# Patient Record
Sex: Male | Born: 1980 | Hispanic: Yes | Marital: Married | Smoking: Current every day smoker
Health system: Southern US, Community
[De-identification: ages and names within clinical notes are randomized; demographics above are authoritative.]

## PROBLEM LIST (undated history)

## (undated) DIAGNOSIS — J309 Allergic rhinitis, unspecified: Secondary | ICD-10-CM

## (undated) DIAGNOSIS — E119 Type 2 diabetes mellitus without complications: Secondary | ICD-10-CM

## (undated) DIAGNOSIS — Z9981 Dependence on supplemental oxygen: Secondary | ICD-10-CM

## (undated) DIAGNOSIS — M545 Low back pain, unspecified: Secondary | ICD-10-CM

## (undated) DIAGNOSIS — K219 Gastro-esophageal reflux disease without esophagitis: Secondary | ICD-10-CM

## (undated) DIAGNOSIS — G4733 Obstructive sleep apnea (adult) (pediatric): Secondary | ICD-10-CM

## (undated) DIAGNOSIS — J449 Chronic obstructive pulmonary disease, unspecified: Secondary | ICD-10-CM

## (undated) DIAGNOSIS — L309 Dermatitis, unspecified: Secondary | ICD-10-CM

## (undated) DIAGNOSIS — F1291 Cannabis use, unspecified, in remission: Secondary | ICD-10-CM

## (undated) DIAGNOSIS — I890 Lymphedema, not elsewhere classified: Secondary | ICD-10-CM

## (undated) DIAGNOSIS — I700 Atherosclerosis of aorta: Secondary | ICD-10-CM

## (undated) DIAGNOSIS — I10 Essential (primary) hypertension: Secondary | ICD-10-CM

## (undated) DIAGNOSIS — J45909 Unspecified asthma, uncomplicated: Secondary | ICD-10-CM

## (undated) DIAGNOSIS — G629 Polyneuropathy, unspecified: Secondary | ICD-10-CM

---

## 2012-12-04 DIAGNOSIS — J4550 Severe persistent asthma, uncomplicated: Secondary | ICD-10-CM | POA: Diagnosis present

## 2013-08-20 DIAGNOSIS — Z720 Tobacco use: Secondary | ICD-10-CM | POA: Insufficient documentation

## 2014-09-24 DIAGNOSIS — R7303 Prediabetes: Secondary | ICD-10-CM | POA: Insufficient documentation

## 2016-05-27 DIAGNOSIS — J9600 Acute respiratory failure, unspecified whether with hypoxia or hypercapnia: Secondary | ICD-10-CM | POA: Diagnosis not present

## 2016-05-27 DIAGNOSIS — J449 Chronic obstructive pulmonary disease, unspecified: Secondary | ICD-10-CM | POA: Diagnosis not present

## 2016-05-29 DIAGNOSIS — R05 Cough: Secondary | ICD-10-CM | POA: Diagnosis not present

## 2016-05-29 DIAGNOSIS — R062 Wheezing: Secondary | ICD-10-CM | POA: Diagnosis not present

## 2016-05-29 DIAGNOSIS — R0789 Other chest pain: Secondary | ICD-10-CM | POA: Diagnosis not present

## 2016-05-29 DIAGNOSIS — J45901 Unspecified asthma with (acute) exacerbation: Secondary | ICD-10-CM | POA: Diagnosis not present

## 2016-05-29 DIAGNOSIS — F17210 Nicotine dependence, cigarettes, uncomplicated: Secondary | ICD-10-CM | POA: Diagnosis not present

## 2016-06-09 DIAGNOSIS — J441 Chronic obstructive pulmonary disease with (acute) exacerbation: Secondary | ICD-10-CM | POA: Diagnosis not present

## 2016-06-09 DIAGNOSIS — I209 Angina pectoris, unspecified: Secondary | ICD-10-CM | POA: Diagnosis not present

## 2016-06-09 DIAGNOSIS — J309 Allergic rhinitis, unspecified: Secondary | ICD-10-CM | POA: Diagnosis not present

## 2016-06-09 DIAGNOSIS — F1290 Cannabis use, unspecified, uncomplicated: Secondary | ICD-10-CM | POA: Diagnosis not present

## 2016-06-09 DIAGNOSIS — F17210 Nicotine dependence, cigarettes, uncomplicated: Secondary | ICD-10-CM | POA: Diagnosis not present

## 2016-06-09 DIAGNOSIS — R079 Chest pain, unspecified: Secondary | ICD-10-CM | POA: Diagnosis not present

## 2016-06-09 DIAGNOSIS — J4550 Severe persistent asthma, uncomplicated: Secondary | ICD-10-CM | POA: Diagnosis not present

## 2016-06-09 DIAGNOSIS — E663 Overweight: Secondary | ICD-10-CM | POA: Diagnosis not present

## 2016-06-09 DIAGNOSIS — Z720 Tobacco use: Secondary | ICD-10-CM | POA: Diagnosis not present

## 2016-06-09 DIAGNOSIS — Z6828 Body mass index (BMI) 28.0-28.9, adult: Secondary | ICD-10-CM | POA: Diagnosis not present

## 2016-06-09 DIAGNOSIS — J4551 Severe persistent asthma with (acute) exacerbation: Secondary | ICD-10-CM | POA: Diagnosis not present

## 2016-06-09 DIAGNOSIS — Z7952 Long term (current) use of systemic steroids: Secondary | ICD-10-CM | POA: Diagnosis not present

## 2016-06-09 DIAGNOSIS — R768 Other specified abnormal immunological findings in serum: Secondary | ICD-10-CM | POA: Diagnosis not present

## 2017-03-27 ENCOUNTER — Other Ambulatory Visit: Payer: Self-pay

## 2017-03-27 ENCOUNTER — Emergency Department: Payer: Medicare Other

## 2017-03-27 ENCOUNTER — Emergency Department
Admission: EM | Admit: 2017-03-27 | Discharge: 2017-03-27 | Disposition: A | Discharge status: Home / Self Care | Payer: Medicare Other | Attending: Emergency Medicine | Admitting: Emergency Medicine

## 2017-03-27 ENCOUNTER — Encounter: Payer: Self-pay | Admitting: Physician Assistant

## 2017-03-27 DIAGNOSIS — J4551 Severe persistent asthma with (acute) exacerbation: Secondary | ICD-10-CM | POA: Diagnosis not present

## 2017-03-27 DIAGNOSIS — Z79899 Other long term (current) drug therapy: Secondary | ICD-10-CM | POA: Insufficient documentation

## 2017-03-27 DIAGNOSIS — F17200 Nicotine dependence, unspecified, uncomplicated: Secondary | ICD-10-CM | POA: Diagnosis not present

## 2017-03-27 DIAGNOSIS — L308 Other specified dermatitis: Secondary | ICD-10-CM | POA: Insufficient documentation

## 2017-03-27 DIAGNOSIS — R062 Wheezing: Secondary | ICD-10-CM | POA: Diagnosis present

## 2017-03-27 HISTORY — DX: Unspecified asthma, uncomplicated: J45.909

## 2017-03-27 HISTORY — DX: Dermatitis, unspecified: L30.9

## 2017-03-27 MED ORDER — PREDNISONE 20 MG PO TABS: 20.0000 mg | ORAL_TABLET | Freq: Every day | ORAL | 1 refills | Status: DC

## 2017-03-27 MED ORDER — METHYLPREDNISOLONE SODIUM SUCC 125 MG IJ SOLR
125.0000 mg | Freq: Once | INTRAMUSCULAR | Status: AC
  Administered 2017-03-27: 125 mg via INTRAVENOUS
  Filled 2017-03-27: qty 2

## 2017-03-27 MED ORDER — ALBUTEROL SULFATE (2.5 MG/3ML) 0.083% IN NEBU
5.0000 mg/h | INHALATION_SOLUTION | Freq: Once | RESPIRATORY_TRACT | Status: AC
  Administered 2017-03-27: 5 mg/h via RESPIRATORY_TRACT
  Filled 2017-03-27: qty 6

## 2017-03-27 MED ORDER — IPRATROPIUM-ALBUTEROL 0.5-2.5 (3) MG/3ML IN SOLN
3.0000 mL | Freq: Once | RESPIRATORY_TRACT | Status: AC
  Administered 2017-03-27: 3 mL via RESPIRATORY_TRACT
  Filled 2017-03-27: qty 3

## 2017-03-27 MED ORDER — IPRATROPIUM-ALBUTEROL 0.5-2.5 (3) MG/3ML IN SOLN: 3.0000 mL | Freq: Four times a day (QID) | RESPIRATORY_TRACT | 1 refills | Status: DC | PRN

## 2017-03-27 MED ORDER — PREDNISONE 10 MG (48) PO TBPK: ORAL_TABLET | ORAL | 0 refills | Status: DC

## 2017-03-27 NOTE — ED Provider Notes (Signed)
[facility] Emergency Department Provider Note  ____________________________________________   First MD Initiated Contact with Patient 03/27/17 1620     (approximate)  I have reviewed the triage vital signs and the nursing notes.   HISTORY  Chief Complaint No chief complaint on file.    HPI Braidyn Scorsone is a 37 y.o. male presents to the emergency department today complaining of an asthma attack and eczema flare.  He states he usually takes prednisone 20 mg/day.  He ran out and his doctor will not give him more until he seen on April 23, 2017.  He states he has a history of COPD.  He has multiple inhalers.  He used his nebulizer machine at home without relief.  He denies fever or chills.  He does not have a productive cough.  He has no vomiting or diarrhea  Past Medical History:  Diagnosis Date  . Asthma   . Eczema     There are no active problems to display for this patient.   History reviewed. No pertinent surgical history.  Prior to Admission medications   Medication Sig Start Date End Date Taking? Authorizing Provider  albuterol (PROVENTIL HFA;VENTOLIN HFA) 108 (90 Base) MCG/ACT inhaler Inhale 2 puffs into the lungs every 6 (six) hours as needed for wheezing or shortness of breath.   Yes [provider]  fluticasone-salmeterol (ADVAIR HFA) 230-21 MCG/ACT inhaler Inhale 2 puffs into the lungs 2 (two) times daily.   Yes [provider]  montelukast (SINGULAIR) 10 MG tablet Take 10 mg by mouth at bedtime.   Yes [provider]  ipratropium-albuterol (DUONEB) 0.5-2.5 (3) MG/3ML SOLN Take 3 mLs by nebulization every 6 (six) hours as needed. 03/27/17   Sherrie Mustache Roselyn Bering, [name]  predniSONE (DELTASONE) 20 MG tablet Take 1 tablet (20 mg total) by mouth daily with breakfast. 03/27/17   Sherrie Mustache, Roselyn Bering, [name]  predniSONE (STERAPRED UNI-PAK 48 TAB) 10 MG (48) TBPK tablet Take 6 pills for 2 days then decrease by 1 pill every 2 days 03/27/17    [name], [name]    Allergies Penicillins; Banana; and Eggs or egg-derived products  No family history on file.  Social History Social History   Tobacco Use  . Smoking status: Current Every Day Smoker  . Smokeless tobacco: Never Used  Substance Use Topics  . Alcohol use: No    Frequency: Never  . Drug use: Not on file    Review of Systems  Constitutional: No fever/chills Eyes: No visual changes. ENT: No sore throat. Respiratory: Denies cough, positive for wheezing and shortness of breath Genitourinary: Negative for dysuria. Musculoskeletal: Negative for back pain. Skin: Positive for eczema flare    ____________________________________________   PHYSICAL EXAM:  VITAL SIGNS: ED Triage Vitals  Enc Vitals Group     BP 03/27/17 1617 (!) 156/104     Pulse Rate 03/27/17 1617 95     Resp 03/27/17 1617 18     Temp 03/27/17 1617 98.1 F (36.7 C)     Temp Source 03/27/17 1617 Oral     SpO2 03/27/17 1617 96 %     Weight 03/27/17 1624 160 lb (72.6 kg)     Height 03/27/17 1624 5\' 5"  (1.651 m)     Head Circumference --      Peak Flow --      Pain Score --      Pain Loc --      Pain Edu? --      Excl.  in GC? --     Constitutional: Alert and oriented. Well appearing and in no acute distress. Eyes: Conjunctivae are normal.  Head: Atraumatic. Nose: No congestion/rhinnorhea. Mouth/Throat: Mucous membranes are moist.   Cardiovascular: Normal rate, regular rhythm.  Heart sounds are normal Respiratory: Normal respiratory effort.  No retractions, lungs with diffuse wheezing throughout all lung fields GU: deferred Musculoskeletal: FROM all extremities, warm and well perfused Neurologic:  Normal speech and language.  Skin:  Skin is warm, dry and intact.  Positive for diffuse eczema on his face, upper arms, across upper back, and on his lower legs. Psychiatric: Mood and affect are normal. Speech and behavior are normal.  ____________________________________________    LABS (all labs ordered are listed, but only abnormal results are displayed)  Labs Reviewed - No data to display ____________________________________________   ____________________________________________  RADIOLOGY  Chest x-ray is negative  ____________________________________________   PROCEDURES  Procedure(s) performed: DuoNeb, albuterol 5 mg/h  Procedures    ____________________________________________   INITIAL IMPRESSION / ASSESSMENT AND PLAN / ED COURSE  Pertinent labs & imaging results that were available during my care of the patient were reviewed by me and considered in my medical decision making (see chart for details).  Patient is a 37 year old male complaining of asthma and eczema flare.  He ran out of his prednisone.  On physical exam patient has diffuse wheezing in the lung fields, he has diffuse eczema on the face arms and legs and upper trunk.  DuoNeb was given.  Patient has increased air movement with this but still has diffuse wheezing.  Albuterol 5 mg/h was ordered.  Patient still has wheezing but states he can move air better.  Solu-Medrol 125 mg IV was also given.  The patient will be discharged with a prescription for Sterapred DS 10 mg 12-day Dosepak.  I also gave him a prescription for his prednisone 20 that he is to start after finishing the Sterapred.  He states he understands how to use this medication.  He was given a prescription for DuoNeb Nebules.  If he is worsening he is to return to the emergency department.  He should continue to use all of his medications.  He is to follow-up with his doctor for an already scheduled appointment on April 23, 2017.  Patient states he understands and will comply with our instructions.  He was discharged in stable condition     As part of my medical decision making, I reviewed the following data within the electronic MEDICAL RECORD NUMBER Nursing notes reviewed and incorporated, Old chart reviewed, Radiograph  reviewed chest x-ray was negative, Notes from prior ED visits and [place] Controlled Substance Database  ____________________________________________   FINAL CLINICAL IMPRESSION(S) / ED DIAGNOSES  Final diagnoses:  Other eczema  Severe persistent asthma with acute exacerbation      NEW MEDICATIONS STARTED DURING THIS VISIT:  New Prescriptions   IPRATROPIUM-ALBUTEROL (DUONEB) 0.5-2.5 (3) MG/3ML SOLN    Take 3 mLs by nebulization every 6 (six) hours as needed.   PREDNISONE (STERAPRED UNI-PAK 48 TAB) 10 MG (48) TBPK TABLET    Take 6 pills for 2 days then decrease by 1 pill every 2 days     Note:  This document was prepared using Dragon voice recognition software and may include unintentional dictation errors.    Faythe GheeFisher, Susan W, [name] 03/27/17 1807    Minna AntisPaduchowski, Kevin, MD 03/27/17 [id number]

## 2017-03-27 NOTE — Discharge Instructions (Signed)
Follow-up with your regular doctor for your scheduled appointment on March 11.  Start the steroid pack tomorrow.  When you get down to 20 mg/day you can start to use your regular medication.  Use the DuoNeb Nebules as needed for wheezing.  Return to the emergency department if your worsening

## 2017-03-27 NOTE — ED Triage Notes (Signed)
Presents with asthma  Diff breathing and wheezing  No fever   Occasional cough   Also having some increased swelling to right side of face d/t eczema

## 2017-12-05 ENCOUNTER — Emergency Department: Payer: Medicare Other

## 2017-12-05 ENCOUNTER — Inpatient Hospital Stay
Admission: EM | Admit: 2017-12-05 | Discharge: 2017-12-07 | DRG: 871 | Discharge status: Against Medical Advice | Payer: Medicare Other | Attending: Internal Medicine | Admitting: Internal Medicine

## 2017-12-05 ENCOUNTER — Other Ambulatory Visit: Payer: Self-pay

## 2017-12-05 DIAGNOSIS — K2980 Duodenitis without bleeding: Secondary | ICD-10-CM | POA: Diagnosis present

## 2017-12-05 DIAGNOSIS — F17210 Nicotine dependence, cigarettes, uncomplicated: Secondary | ICD-10-CM | POA: Diagnosis present

## 2017-12-05 DIAGNOSIS — J4551 Severe persistent asthma with (acute) exacerbation: Secondary | ICD-10-CM

## 2017-12-05 DIAGNOSIS — J440 Chronic obstructive pulmonary disease with acute lower respiratory infection: Secondary | ICD-10-CM | POA: Diagnosis present

## 2017-12-05 DIAGNOSIS — R042 Hemoptysis: Secondary | ICD-10-CM | POA: Diagnosis present

## 2017-12-05 DIAGNOSIS — R911 Solitary pulmonary nodule: Secondary | ICD-10-CM | POA: Diagnosis present

## 2017-12-05 DIAGNOSIS — J9601 Acute respiratory failure with hypoxia: Secondary | ICD-10-CM | POA: Diagnosis present

## 2017-12-05 DIAGNOSIS — Z79899 Other long term (current) drug therapy: Secondary | ICD-10-CM | POA: Diagnosis not present

## 2017-12-05 DIAGNOSIS — Z91018 Allergy to other foods: Secondary | ICD-10-CM | POA: Diagnosis not present

## 2017-12-05 DIAGNOSIS — K2900 Acute gastritis without bleeding: Secondary | ICD-10-CM | POA: Diagnosis present

## 2017-12-05 DIAGNOSIS — Z91012 Allergy to eggs: Secondary | ICD-10-CM | POA: Diagnosis not present

## 2017-12-05 DIAGNOSIS — Z7952 Long term (current) use of systemic steroids: Secondary | ICD-10-CM | POA: Diagnosis not present

## 2017-12-05 DIAGNOSIS — J189 Pneumonia, unspecified organism: Secondary | ICD-10-CM

## 2017-12-05 DIAGNOSIS — L309 Dermatitis, unspecified: Secondary | ICD-10-CM | POA: Diagnosis present

## 2017-12-05 DIAGNOSIS — Z880 Allergy status to penicillin: Secondary | ICD-10-CM | POA: Diagnosis not present

## 2017-12-05 DIAGNOSIS — I2690 Septic pulmonary embolism without acute cor pulmonale: Secondary | ICD-10-CM | POA: Diagnosis present

## 2017-12-05 DIAGNOSIS — K219 Gastro-esophageal reflux disease without esophagitis: Secondary | ICD-10-CM | POA: Diagnosis present

## 2017-12-05 DIAGNOSIS — G4733 Obstructive sleep apnea (adult) (pediatric): Secondary | ICD-10-CM | POA: Diagnosis present

## 2017-12-05 DIAGNOSIS — A419 Sepsis, unspecified organism: Secondary | ICD-10-CM | POA: Diagnosis not present

## 2017-12-05 DIAGNOSIS — Z7951 Long term (current) use of inhaled steroids: Secondary | ICD-10-CM | POA: Diagnosis not present

## 2017-12-05 DIAGNOSIS — J984 Other disorders of lung: Secondary | ICD-10-CM

## 2017-12-05 DIAGNOSIS — Z825 Family history of asthma and other chronic lower respiratory diseases: Secondary | ICD-10-CM

## 2017-12-05 LAB — COMPREHENSIVE METABOLIC PANEL
ALBUMIN: 3.7 g/dL (ref 3.5–5.0)
ALT: 13 U/L (ref 0–44)
AST: 14 U/L — AB (ref 15–41)
Alkaline Phosphatase: 58 U/L (ref 38–126)
Anion gap: 9 (ref 5–15)
BUN: 12 mg/dL (ref 6–20)
CHLORIDE: 101 mmol/L (ref 98–111)
CO2: 30 mmol/L (ref 22–32)
CREATININE: 0.87 mg/dL (ref 0.61–1.24)
Calcium: 8.6 mg/dL — ABNORMAL LOW (ref 8.9–10.3)
GFR calc Af Amer: >60 mL/min (ref >60)
GFR calc non Af Amer: >60 mL/min (ref >60)
Glucose, Bld: 96 mg/dL (ref 70–99)
Potassium: 3.9 mmol/L (ref 3.5–5.1)
Sodium: 140 mmol/L (ref 135–145)
Total Bilirubin: 1 mg/dL (ref 0.3–1.2)
Total Protein: 6.9 g/dL (ref 6.5–8.1)

## 2017-12-05 LAB — CBC WITH DIFFERENTIAL/PLATELET
Abs Immature Granulocytes: 0.65 10*3/uL — ABNORMAL HIGH (ref 0.00–0.07)
BASOS ABS: 0.1 10*3/uL (ref 0.0–0.1)
Basophils Relative: 0 %
Eosinophils Absolute: 0.1 10*3/uL (ref 0.0–0.5)
Eosinophils Relative: 0 %
HCT: 47.6 % (ref 39.0–52.0)
Hemoglobin: 14.8 g/dL (ref 13.0–17.0)
IMMATURE GRANULOCYTES: 2 %
LYMPHS ABS: 2.1 10*3/uL (ref 0.7–4.0)
LYMPHS PCT: 5 %
MCH: 25.9 pg — ABNORMAL LOW (ref 26.0–34.0)
MCHC: 31.1 g/dL (ref 30.0–36.0)
MCV: 83.2 fL (ref 80.0–100.0)
MONO ABS: 3.9 10*3/uL — AB (ref 0.1–1.0)
Monocytes Relative: 10 %
NRBC: 0 % (ref 0.0–0.2)
Neutro Abs: 33.8 10*3/uL — ABNORMAL HIGH (ref 1.7–7.7)
Neutrophils Relative %: 83 %
Platelets: 449 10*3/uL — ABNORMAL HIGH (ref 150–400)
RBC: 5.72 MIL/uL (ref 4.22–5.81)
RDW: 16.9 % — AB (ref 11.5–15.5)
SMEAR REVIEW: NORMAL
WBC: 40.7 10*3/uL — ABNORMAL HIGH (ref 4.0–10.5)

## 2017-12-05 LAB — URINALYSIS, ROUTINE W REFLEX MICROSCOPIC
Bilirubin Urine: NEGATIVE
Glucose, UA: NEGATIVE mg/dL
Hgb urine dipstick: NEGATIVE
KETONES UR: NEGATIVE mg/dL
LEUKOCYTES UA: NEGATIVE
NITRITE: NEGATIVE
PH: 7 (ref 5.0–8.0)
PROTEIN: NEGATIVE mg/dL
Specific Gravity, Urine: 1.016 (ref 1.005–1.030)

## 2017-12-05 LAB — BLOOD GAS, VENOUS
ACID-BASE EXCESS: 5.7 mmol/L — AB (ref 0.0–2.0)
Bicarbonate: 31.7 mmol/L — ABNORMAL HIGH (ref 20.0–28.0)
O2 Saturation: 94.4 %
PH VEN: 7.41 (ref 7.250–7.430)
Patient temperature: 37
pCO2, Ven: 50 mmHg (ref 44.0–60.0)
pO2, Ven: 72 mmHg — ABNORMAL HIGH (ref 32.0–45.0)

## 2017-12-05 LAB — TROPONIN I

## 2017-12-05 LAB — MAGNESIUM: MAGNESIUM: 1.7 mg/dL (ref 1.7–2.4)

## 2017-12-05 LAB — LACTATE DEHYDROGENASE: LDH: 109 U/L (ref 98–192)

## 2017-12-05 LAB — EXPECTORATED SPUTUM ASSESSMENT W REFEX TO RESP CULTURE

## 2017-12-05 LAB — EXPECTORATED SPUTUM ASSESSMENT W GRAM STAIN, RFLX TO RESP C: Special Requests: NORMAL

## 2017-12-05 LAB — PROCALCITONIN: Procalcitonin: 0.2 ng/mL

## 2017-12-05 LAB — MRSA PCR SCREENING: MRSA BY PCR: NEGATIVE

## 2017-12-05 LAB — LACTIC ACID, PLASMA: LACTIC ACID, VENOUS: 1.7 mmol/L (ref 0.5–1.9)

## 2017-12-05 MED ORDER — SODIUM CHLORIDE 0.9 % IV BOLUS (SEPSIS)
1000.0000 mL | Freq: Once | INTRAVENOUS | Status: AC
  Administered 2017-12-05: 1000 mL via INTRAVENOUS

## 2017-12-05 MED ORDER — IPRATROPIUM-ALBUTEROL 0.5-2.5 (3) MG/3ML IN SOLN
3.0000 mL | RESPIRATORY_TRACT | Status: DC
  Administered 2017-12-05 – 2017-12-07 (×11): 3 mL via RESPIRATORY_TRACT
  Filled 2017-12-05 (×13): qty 3

## 2017-12-05 MED ORDER — SODIUM CHLORIDE 0.9 % IV SOLN
INTRAVENOUS | Status: DC
  Administered 2017-12-05: 16:00:00 via INTRAVENOUS

## 2017-12-05 MED ORDER — VANCOMYCIN HCL IN DEXTROSE 1-5 GM/200ML-% IV SOLN
1000.0000 mg | Freq: Once | INTRAVENOUS | Status: AC
  Administered 2017-12-05: 1000 mg via INTRAVENOUS
  Filled 2017-12-05: qty 200

## 2017-12-05 MED ORDER — ONDANSETRON HCL 4 MG PO TABS: 4.0000 mg | ORAL_TABLET | Freq: Four times a day (QID) | ORAL | Status: DC | PRN

## 2017-12-05 MED ORDER — LEVOFLOXACIN IN D5W 750 MG/150ML IV SOLN
750.0000 mg | Freq: Once | INTRAVENOUS | Status: AC
  Administered 2017-12-05: 750 mg via INTRAVENOUS
  Filled 2017-12-05: qty 150

## 2017-12-05 MED ORDER — ALBUTEROL (5 MG/ML) CONTINUOUS INHALATION SOLN
15.0000 mg/h | INHALATION_SOLUTION | Freq: Once | RESPIRATORY_TRACT | Status: AC
  Administered 2017-12-05: 15 mg/h via RESPIRATORY_TRACT
  Filled 2017-12-05: qty 20

## 2017-12-05 MED ORDER — BISACODYL 5 MG PO TBEC
5.0000 mg | DELAYED_RELEASE_TABLET | Freq: Every day | ORAL | Status: DC | PRN
  Filled 2017-12-05: qty 1

## 2017-12-05 MED ORDER — HYDROCODONE-ACETAMINOPHEN 5-325 MG PO TABS: 1.0000 | ORAL_TABLET | ORAL | Status: DC | PRN

## 2017-12-05 MED ORDER — GUAIFENESIN-DM 100-10 MG/5ML PO SYRP
5.0000 mL | ORAL_SOLUTION | ORAL | Status: DC | PRN
  Administered 2017-12-05: 5 mL via ORAL
  Filled 2017-12-05: qty 5

## 2017-12-05 MED ORDER — IPRATROPIUM BROMIDE 0.02 % IN SOLN
0.5000 mg | Freq: Once | RESPIRATORY_TRACT | Status: AC
  Administered 2017-12-05: 0.5 mg via RESPIRATORY_TRACT
  Filled 2017-12-05: qty 2.5

## 2017-12-05 MED ORDER — ONDANSETRON HCL 4 MG/2ML IJ SOLN: 4.0000 mg | Freq: Four times a day (QID) | INTRAMUSCULAR | Status: DC | PRN

## 2017-12-05 MED ORDER — MOMETASONE FURO-FORMOTEROL FUM 200-5 MCG/ACT IN AERO
2.0000 | INHALATION_SPRAY | Freq: Two times a day (BID) | RESPIRATORY_TRACT | Status: DC
  Administered 2017-12-05 – 2017-12-07 (×4): 2 via RESPIRATORY_TRACT
  Filled 2017-12-05: qty 8.8

## 2017-12-05 MED ORDER — DOCUSATE SODIUM 100 MG PO CAPS
100.0000 mg | ORAL_CAPSULE | Freq: Two times a day (BID) | ORAL | Status: DC
  Administered 2017-12-05 – 2017-12-07 (×3): 100 mg via ORAL
  Filled 2017-12-05 (×5): qty 1

## 2017-12-05 MED ORDER — MONTELUKAST SODIUM 10 MG PO TABS
10.0000 mg | ORAL_TABLET | Freq: Every day | ORAL | Status: DC
  Administered 2017-12-05 – 2017-12-06 (×2): 10 mg via ORAL
  Filled 2017-12-05 (×2): qty 1

## 2017-12-05 MED ORDER — SODIUM CHLORIDE 0.9 % IV BOLUS (SEPSIS)
250.0000 mL | Freq: Once | INTRAVENOUS | Status: AC
  Administered 2017-12-05: 250 mL via INTRAVENOUS

## 2017-12-05 MED ORDER — METHYLPREDNISOLONE SODIUM SUCC 125 MG IJ SOLR
60.0000 mg | INTRAMUSCULAR | Status: DC
  Administered 2017-12-06: 60 mg via INTRAVENOUS
  Filled 2017-12-05: qty 2

## 2017-12-05 MED ORDER — VANCOMYCIN HCL 10 G IV SOLR
1250.0000 mg | Freq: Two times a day (BID) | INTRAVENOUS | Status: DC
  Administered 2017-12-05 – 2017-12-06 (×2): 1250 mg via INTRAVENOUS
  Filled 2017-12-05 (×3): qty 1250

## 2017-12-05 MED ORDER — FAMOTIDINE IN NACL 20-0.9 MG/50ML-% IV SOLN
INTRAVENOUS | Status: AC
  Administered 2017-12-05: 20 mg via INTRAVENOUS
  Filled 2017-12-05: qty 50

## 2017-12-05 MED ORDER — ACETAMINOPHEN 325 MG PO TABS: 650.0000 mg | ORAL_TABLET | Freq: Four times a day (QID) | ORAL | Status: DC | PRN

## 2017-12-05 MED ORDER — MAGNESIUM SULFATE 2 GM/50ML IV SOLN
2.0000 g | Freq: Once | INTRAVENOUS | Status: AC
  Administered 2017-12-05: 2 g via INTRAVENOUS
  Filled 2017-12-05: qty 50

## 2017-12-05 MED ORDER — IOPAMIDOL (ISOVUE-300) INJECTION 61%
30.0000 mL | Freq: Once | INTRAVENOUS | Status: AC | PRN
  Administered 2017-12-05: 30 mL via ORAL

## 2017-12-05 MED ORDER — ALBUTEROL SULFATE HFA 108 (90 BASE) MCG/ACT IN AERS: 2.0000 | INHALATION_SPRAY | Freq: Four times a day (QID) | RESPIRATORY_TRACT | Status: DC | PRN

## 2017-12-05 MED ORDER — ACETAMINOPHEN 650 MG RE SUPP: 650.0000 mg | Freq: Four times a day (QID) | RECTAL | Status: DC | PRN

## 2017-12-05 MED ORDER — LEVOFLOXACIN IN D5W 750 MG/150ML IV SOLN
750.0000 mg | INTRAVENOUS | Status: DC
  Administered 2017-12-06 – 2017-12-07 (×2): 750 mg via INTRAVENOUS
  Filled 2017-12-05 (×3): qty 150

## 2017-12-05 MED ORDER — METHYLPREDNISOLONE SODIUM SUCC 125 MG IJ SOLR
125.0000 mg | Freq: Once | INTRAMUSCULAR | Status: AC
  Administered 2017-12-05: 125 mg via INTRAVENOUS

## 2017-12-05 MED ORDER — SODIUM CHLORIDE 0.9% FLUSH
3.0000 mL | Freq: Two times a day (BID) | INTRAVENOUS | Status: DC
  Administered 2017-12-05 – 2017-12-07 (×4): 3 mL via INTRAVENOUS

## 2017-12-05 MED ORDER — ALBUTEROL SULFATE (2.5 MG/3ML) 0.083% IN NEBU
INHALATION_SOLUTION | RESPIRATORY_TRACT | Status: AC
  Filled 2017-12-05: qty 18

## 2017-12-05 MED ORDER — FAMOTIDINE IN NACL 20-0.9 MG/50ML-% IV SOLN
20.0000 mg | Freq: Two times a day (BID) | INTRAVENOUS | Status: DC
  Administered 2017-12-05 – 2017-12-07 (×5): 20 mg via INTRAVENOUS
  Filled 2017-12-05 (×4): qty 50

## 2017-12-05 MED ORDER — ENOXAPARIN SODIUM 40 MG/0.4ML ~~LOC~~ SOLN
40.0000 mg | SUBCUTANEOUS | Status: DC
  Filled 2017-12-05 (×2): qty 0.4

## 2017-12-05 MED ORDER — IOPAMIDOL (ISOVUE-370) INJECTION 76%
100.0000 mL | Freq: Once | INTRAVENOUS | Status: AC | PRN
  Administered 2017-12-05: 100 mL via INTRAVENOUS

## 2017-12-05 NOTE — Consult Note (Signed)
Pharmacy Antibiotic Note  Joseph [name] is a 37 y.o. male admitted on 12/05/2017 with pneumonia.  Pharmacy has been consulted for levofloxacin and vancomycin dosing.  Plan: Vancomycin 1000 IV once followed by 6 hour stack dosing vancomycin 1250 mg IV every 12 hours.  Goal trough 15-20 mcg/mL.  Will draw trough prior to the fifth dose.  Levofloxacin 750 mg IV every 24 hours  Height: 5\' 5"  (165.1 cm) Weight: 157 lb 13.6 oz (71.6 kg) IBW/kg (Calculated) : 61.5  Temp (24hrs), Avg:99.7 F (37.6 C), Min:99.7 F (37.6 C), Max:99.7 F (37.6 C)  Recent Labs  Lab 12/05/17 0731  WBC 40.7*  CREATININE 0.87  LATICACIDVEN 1.7    Estimated Creatinine Clearance: 99.2 mL/min (by C-G formula based on SCr of 0.87 mg/dL).    Allergies  Allergen Reactions  . Penicillins Shortness Of Breath  . Banana Hives  . Eggs Or Egg-Derived Products Hives    Antimicrobials this admission: Levofloxacin 10/23 >>  Vanco 10/23 >>   Dose adjustments this admission:   Microbiology results: 10/23 BCx: pending 10/23 UCx: pending   Sputum:   10/23 MRSA PCR: pending  Thank you for allowing pharmacy to be a part of this patient's care.  Orinda Kenner, PharmD 12/05/2017 12:57 PM

## 2017-12-05 NOTE — ED Notes (Signed)
Pt attemted to void. Unable to produce urine at this time. Pt aware of smaple needed. Antibiotics started.

## 2017-12-05 NOTE — Plan of Care (Signed)
Encourage patient to increase fluid intake.

## 2017-12-05 NOTE — ED Notes (Signed)
Consulted with pharmacy about albuterol nebulizer order, states that nebulizer in ED pyxis are appropriate for order.

## 2017-12-05 NOTE — ED Triage Notes (Signed)
Pt to ED via POV. Pt c/o Sob that started around 1 am. Pt took nebulizer treatment and o2 at home with no relief. Pt has expiratory wheezes and is tachypnea.

## 2017-12-05 NOTE — H&P (Addendum)
[facility] [place] [facility]   PATIENT NAME: Joseph [name]    MR#:  [id number]  DATE OF BIRTH:  1980/07/13  DATE OF ADMISSION:  12/05/2017  PRIMARY CARE PHYSICIAN: System, Pcp Not In   REQUESTING/REFERRING PHYSICIAN: Dr. Lucinda Dell  CHIEF COMPLAINT: Shortness of breath   Chief Complaint  Patient presents with  . Shortness of Breath    HISTORY OF PRESENT ILLNESS:  Joseph [name]  is a 37 y.o. male with a known history of persistent asthma on steroids comes in because of 2-day history of fever, chills, shortness of breath, cough.  Patient has poor appetite, weight loss for several weeks.  No recent history of travel.  Hemoptysis.  No history of IV drug abuse.  Patient was in sinus tach with heart rate up to 140 bpm when he came, received up to 2 L fluid.  Heart rate now is 106. PAST MEDICAL HISTORY:   Past Medical History:  Diagnosis Date  . Asthma   . Eczema     PAST SURGICAL HISTOIRY:  History reviewed. No pertinent surgical history.  SOCIAL HISTORY:   Social History   Tobacco Use  . Smoking status: Current Every Day Smoker  . Smokeless tobacco: Never Used  Substance Use Topics  . Alcohol use: No    Frequency: Never    FAMILY HISTORY:  History reviewed. No pertinent family history.  DRUG ALLERGIES:   Allergies  Allergen Reactions  . Penicillins Shortness Of Breath  . Banana Hives  . Eggs Or Egg-Derived Products Hives    REVIEW OF SYSTEMS:  CONSTITUTIONAL: Fever, fatigue, chills  eYES: No blurred or double vision.  EARS, NOSE, AND THROAT: No tinnitus or ear pain.  RESPIRATORY: Cough, shortness of breath for 2 days CARDIOVASCULAR: No chest pain, orthopnea, edema.  GASTROINTESTINAL: No nausea, vomiting, diarrhea or abdominal pain.  GENITOURINARY: No dysuria, hematuria.  ENDOCRINE: No polyuria, nocturia,  HEMATOLOGY: No anemia, easy bruising or bleeding SKIN: No rash or lesion. MUSCULOSKELETAL: No joint pain or arthritis.    NEUROLOGIC: No tingling, numbness, weakness.  PSYCHIATRY: No anxiety or depression.   MEDICATIONS AT HOME:   Prior to Admission medications   Medication Sig Start Date End Date Taking? Authorizing Provider  albuterol (PROVENTIL HFA;VENTOLIN HFA) 108 (90 Base) MCG/ACT inhaler Inhale 2 puffs into the lungs every 6 (six) hours as needed for wheezing or shortness of breath.   Yes [provider]  Fluticasone-Salmeterol (ADVAIR) 500-50 MCG/DOSE AEPB Inhale 1 puff into the lungs every 12 (twelve) hours. 07/21/14  Yes [provider]  ipratropium-albuterol (DUONEB) 0.5-2.5 (3) MG/3ML SOLN Take 3 mLs by nebulization every 6 (six) hours as needed. 03/27/17  Yes Fisher, Roselyn Bering, [name]  montelukast (SINGULAIR) 10 MG tablet Take 10 mg by mouth at bedtime.   Yes [provider]  omeprazole (PRILOSEC) 20 MG capsule Take 20 mg by mouth daily. 06/27/16  Yes [provider]  predniSONE (DELTASONE) 20 MG tablet Take 1 tablet (20 mg total) by mouth daily with breakfast. Patient taking differently: Take 30 mg by mouth daily.  03/27/17  Yes Fisher, Roselyn Bering, [name]  sulfamethoxazole-trimethoprim (BACTRIM,SEPTRA) 400-80 MG tablet Take 1 tablet by mouth daily. 04/24/17  Yes [provider]  Tiotropium Bromide Monohydrate (SPIRIVA RESPIMAT) 2.5 MCG/ACT AERS Inhale 2 puffs into the lungs daily. 04/24/17  Yes [provider]      VITAL SIGNS:  Blood pressure (!) 141/99, pulse (!) 113, temperature 99.7 F (37.6 C), temperature source Oral, resp. rate Marland Kitchen)  26, weight 71.6 kg, SpO2 96 %.  PHYSICAL EXAMINATION:  GENERAL:  37 y.o.-year-old patient lying in the bed with no acute distress.  EYES: Pupils equal, round, reactive to light . No scleral icterus. Extraocular muscles intact.  HEENT: Head atraumatic, normocephalic. Oropharynx and nasopharynx clear.  NECK:  Supple, no jugular venous distention. No thyroid enlargement, no tenderness.  LUNGS: Bilateral coarse expiratory  wheezing all lung fields. CARDIOVASCULAR: S1, S2 tachycardia.. No murmurs, rubs, or gallops.  ABDOMEN: Soft, nontender, nondistended. Bowel sounds present. No organomegaly or mass.  EXTREMITIES: No pedal edema, cyanosis, or clubbing.  NEUROLOGIC: Cranial nerves II through XII are intact. Muscle strength 5/5 in all extremities. Sensation intact. Gait not checked.  PSYCHIATRIC: The patient is alert and oriented x 3.  SKIN: No obvious rash, lesion, or ulcer.   LABORATORY PANEL:   CBC Recent Labs  Lab 12/05/17 0731  WBC 40.7*  HGB 14.8  HCT 47.6  PLT 449*   ------------------------------------------------------------------------------------------------------------------  Chemistries  Recent Labs  Lab 12/05/17 0731  NA 140  K 3.9  CL 101  CO2 30  GLUCOSE 96  BUN 12  CREATININE 0.87  CALCIUM 8.6*  MG 1.7  AST 14*  ALT 13  ALKPHOS 58  BILITOT 1.0   ------------------------------------------------------------------------------------------------------------------  Cardiac Enzymes Recent Labs  Lab 12/05/17 0731  TROPONINI <0.03   ------------------------------------------------------------------------------------------------------------------  RADIOLOGY:  Ct Angio Chest Pe W And/or Wo Contrast  Result Date: 12/05/2017 CLINICAL DATA:  Shortness of breath.  Tachypnea.  Abdominal pain. EXAM: CT ANGIOGRAPHY CHEST CT ABDOMEN AND PELVIS WITH CONTRAST TECHNIQUE: Multidetector CT imaging of the chest was performed using the standard protocol during bolus administration of intravenous contrast. Multiplanar CT image reconstructions and MIPs were obtained to evaluate the vascular anatomy. Multidetector CT imaging of the abdomen and pelvis was performed using the standard protocol during bolus administration of intravenous contrast. Oral contrast was administered for the CT abdomen and pelvis. CONTRAST:  ISOVUE-370 IOPAMIDOL (ISOVUE-370) INJECTION 76% COMPARISON:  Chest  radiograph December 05, 2017 FINDINGS: CTA CHEST FINDINGS Cardiovascular: There is no demonstrable pulmonary embolus. There is no thoracic aortic aneurysm or dissection. The visualized great vessels appear normal. Note that the right innominate and left common carotid arteries arise as a common trunk, an anatomic variant. There is no appreciable pericardial effusion or pericardial thickening. Mediastinum/Nodes: Thyroid appears unremarkable. There are scattered subcentimeter mediastinal lymph nodes. There is no adenopathy by size criteria. There is a small hiatal hernia. Lungs/Pleura: There is peribronchial thickening centrally consistent with a degree of bronchitis. There are multiple areas of patchy airspace disease throughout both upper and lower lobes. Several of these areas show areas of cavitation. There are no more confluent areas of airspace consolidation. No pleural effusion or pleural thickening evident. Musculoskeletal: Multiple healed rib fractures are noted on the right. There are no blastic or lytic bone lesions. No evident chest wall lesions. Review of the MIP images confirms the above findings. CT ABDOMEN and PELVIS FINDINGS Hepatobiliary: No focal liver lesions are evident. Gallbladder wall is not appreciably thickened. There is no biliary duct dilatation. Pancreas: No pancreatic mass or inflammatory focus. Spleen: No splenic lesions are evident. Adrenals/Urinary Tract: Adrenals bilaterally appear unremarkable. 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 There is no evident hydronephrosis on either side. There is no renal or ureteral calculus on either side. Urinary bladder is midline with wall thickness within normal limits. Stomach/Bowel: There is a degree of wall thickening  in the distal stomach and proximal duodenum. No other bowel wall thickening is evident. No evident bowel obstruction. No free air or portal venous air. Vascular/Lymphatic: No  abdominal aortic aneurysm evident. There are no appreciable vascular lesions. There is no evident adenopathy in the abdomen or pelvis. Reproductive: Prostate and seminal vesicles appear normal in size and contour. No evident pelvic mass. Other: Appendix appears unremarkable. There is no abscess or ascites in the abdomen or pelvis. Musculoskeletal: There are no blastic or lytic bone lesions in the abdomen and pelvis. No intramuscular or abdominal wall lesions evident. Review of the MIP images confirms the above findings. IMPRESSION: CT angiogram chest: 1. No demonstrable pulmonary embolus. No thoracic aortic aneurysm or dissection. 2. Areas of patchy airspace opacity throughout the lungs somewhat diffusely with multiple areas of cavitation. Suspect multifocal septic emboli with associated pneumonitis. There may well be focal cavitary pneumonia in these areas as well. No large areas of airspace consolidation evident. 3. Central peribronchial thickening consistent with a degree of bronchitis. 4.  No appreciable adenopathy. 5.  Small hiatal hernia. CT abdomen and pelvis: 1. There is a degree of wall thickening in the distal stomach and proximal duodenum, likely indicative of distal gastritis and proximal duodenitis. No other bowel wall thickening. No evident bowel obstruction. 2. Appendix appears unremarkable. No abscess in the abdomen or pelvis. 3.  No evident renal or ureteral calculus.  No hydronephrosis. 4. No adenopathy appreciable in the abdomen or pelvis. No abnormal fluid collections. Electronically Signed   By: Bretta Bang III M.D.   On: 12/05/2017 11:22   Ct Abdomen Pelvis W Contrast  Result Date: 12/05/2017 CLINICAL DATA:  Shortness of breath.  Tachypnea.  Abdominal pain. EXAM: CT ANGIOGRAPHY CHEST CT ABDOMEN AND PELVIS WITH CONTRAST TECHNIQUE: Multidetector CT imaging of the chest was performed using the standard protocol during bolus administration of intravenous contrast. Multiplanar CT image  reconstructions and MIPs were obtained to evaluate the vascular anatomy. Multidetector CT imaging of the abdomen and pelvis was performed using the standard protocol during bolus administration of intravenous contrast. Oral contrast was administered for the CT abdomen and pelvis. CONTRAST:  ISOVUE-370 IOPAMIDOL (ISOVUE-370) INJECTION 76% COMPARISON:  Chest radiograph December 05, 2017 FINDINGS: CTA CHEST FINDINGS Cardiovascular: There is no demonstrable pulmonary embolus. There is no thoracic aortic aneurysm or dissection. The visualized great vessels appear normal. Note that the right innominate and left common carotid arteries arise as a common trunk, an anatomic variant. There is no appreciable pericardial effusion or pericardial thickening. Mediastinum/Nodes: Thyroid appears unremarkable. There are scattered subcentimeter mediastinal lymph nodes. There is no adenopathy by size criteria. There is a small hiatal hernia. Lungs/Pleura: There is peribronchial thickening centrally consistent with a degree of bronchitis. There are multiple areas of patchy airspace disease throughout both upper and lower lobes. Several of these areas show areas of cavitation. There are no more confluent areas of airspace consolidation. No pleural effusion or pleural thickening evident. Musculoskeletal: Multiple healed rib fractures are noted on the right. There are no blastic or lytic bone lesions. No evident chest wall lesions. Review of the MIP images confirms the above findings. CT ABDOMEN and PELVIS FINDINGS Hepatobiliary: No focal liver lesions are evident. Gallbladder wall is not appreciably thickened. There is no biliary duct dilatation. Pancreas: No pancreatic mass or inflammatory focus. Spleen: No splenic lesions are evident. Adrenals/Urinary Tract: Adrenals bilaterally appear unremarkable. 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 There  is no evident hydronephrosis on  either side. There is no renal or ureteral calculus on either side. Urinary bladder is midline with wall thickness within normal limits. Stomach/Bowel: There is a degree of wall thickening in the distal stomach and proximal duodenum. No other bowel wall thickening is evident. No evident bowel obstruction. No free air or portal venous air. Vascular/Lymphatic: No abdominal aortic aneurysm evident. There are no appreciable vascular lesions. There is no evident adenopathy in the abdomen or pelvis. Reproductive: Prostate and seminal vesicles appear normal in size and contour. No evident pelvic mass. Other: Appendix appears unremarkable. There is no abscess or ascites in the abdomen or pelvis. Musculoskeletal: There are no blastic or lytic bone lesions in the abdomen and pelvis. No intramuscular or abdominal wall lesions evident. Review of the MIP images confirms the above findings. IMPRESSION: CT angiogram chest: 1. No demonstrable pulmonary embolus. No thoracic aortic aneurysm or dissection. 2. Areas of patchy airspace opacity throughout the lungs somewhat diffusely with multiple areas of cavitation. Suspect multifocal septic emboli with associated pneumonitis. There may well be focal cavitary pneumonia in these areas as well. No large areas of airspace consolidation evident. 3. Central peribronchial thickening consistent with a degree of bronchitis. 4.  No appreciable adenopathy. 5.  Small hiatal hernia. CT abdomen and pelvis: 1. There is a degree of wall thickening in the distal stomach and proximal duodenum, likely indicative of distal gastritis and proximal duodenitis. No other bowel wall thickening. No evident bowel obstruction. 2. Appendix appears unremarkable. No abscess in the abdomen or pelvis. 3.  No evident renal or ureteral calculus.  No hydronephrosis. 4. No adenopathy appreciable in the abdomen or pelvis. No abnormal fluid collections. Electronically Signed   By: Bretta Bang III M.D.   On: 12/05/2017  11:22   Dg Chest Port 1 View  Result Date: 12/05/2017 CLINICAL DATA:  Shortness of breath EXAM: PORTABLE CHEST 1 VIEW COMPARISON:  March 27, 2017 FINDINGS: There is no appreciable edema or consolidation. The heart size and pulmonary vascularity are normal. No adenopathy. There are multiple old healed rib fractures on the right with remodeling. IMPRESSION: Multiple old healed rib fractures on the right. No edema or consolidation. Stable cardiac silhouette. Electronically Signed   By: Bretta Bang III M.D.   On: 12/05/2017 07:52    EKG:   Orders placed or performed during the hospital encounter of 12/05/17  . EKG 12-Lead  . EKG 12-Lead  . ED EKG 12-Lead  . ED EKG 12-Lead    IMPRESSION AND PLAN:   37 year old male patient with history of tobacco abuse, moderate persistent asthma on chronic steroids, bronchodilators comes in because of worsening shortness of breath, cough, fever, subjective chills for 2 days and found to have sepsis with a sinus tachycardia with heart rate 140 bpm, with moderate respiratory distress on arrival and now on 3 L of oxygen and saturation 96%.  CT angios chest showed no PE but concerning for multifocal pneumonia, septic emboli, focal cavitary pneumonia, duodenitis, gastritis on the CT abdomen.  1.  Sepsis present on admission due to multifocal pneumonia, has evidence of sinus tachycardia, severely elevated white count up to 40, unable to exclude septic emboli: Possible infective endocarditis, continue IV fluids, IV antibiotics with Vanco and Zosyn, monitor on telemetry, follow sputum cultures, obtain 2D echo, urine toxicology ordered.Marland Kitchen  Spoke with ID Dr. Joylene Draft. 2.  Moderate persistent asthma:with  Asthma exacerbation continue oxygen, bronchodilators, IV steroids 3.  Focal cavitary lesion,; exclude TB, placed on  airborne isolation, check sputum for AFB, QuantiFERON gold test is ordered.  Spoke with Dr. Joylene Draft from ID recommended urine toxicology, #4  acute gastritis, duodenitis: Continue IV PPIs. Continue airborne precautions, discussed with wife, also discussed with Dr. Joylene Draft who will see the patient. Explained the patient to quit smoking, offered assistance, smoking cessation counseling done for about 15 minutes. All the records are reviewed and case discussed with ED provider. Management plans discussed with the patient, family and they are in agreement.  CODE STATUS: Full code  TOTAL TIME TAKING CARE OF THIS PATIENT: 58 minutes.    Katha Hamming M.D on 12/05/2017 at 12:34 PM  Between 7am to 6pm - Pager - [phone]  After 6pm go to [email or web address] - password [email or web address]  [facility]  Office  [phone]  CC: Primary care physician; System, Pcp Not In  Note: This dictation was prepared with Dragon dictation along with smaller phrase technology. Any transcriptional errors that result from this process are unintentional.

## 2017-12-05 NOTE — ED Notes (Signed)
Pt and family made aware of need for mask while around pt. Pts wife refused to wear mask.

## 2017-12-05 NOTE — ED Provider Notes (Signed)
[facility] EMERGENCY DEPARTMENT Provider Note   CSN: [id number] Arrival date & time: 12/05/17  9147     History   Chief Complaint Chief Complaint  Patient presents with  . Shortness of Breath    HPI Joseph [name] is a 37 y.o. male hx of severe asthma on chronic prednisone, here presenting with shortness of breath, tachycardia.  Patient states that at he has been short of breath since about 1 AM.  He has used his albuterol at home with no relief.  Patient also was noted to have some subjective chills and subjective fevers at home but no temperature taken.  No Tylenol or Motrin prior to arrival.  Patient was noted to have low-grade temperature as well as tachycardia in triage.  Patient states that he follows up with pulmonology outpatient is on prednisone 30 mg daily for the last several months.  He also has been losing weight and has poor appetite for the last several weeks.   The history is provided by the patient and the spouse.  Level V caveat- condition of patient   Past Medical History:  Diagnosis Date  . Asthma   . Eczema     There are no active problems to display for this patient.   History reviewed. No pertinent surgical history.      Home Medications    Prior to Admission medications   Medication Sig Start Date End Date Taking? Authorizing Provider  albuterol (PROVENTIL HFA;VENTOLIN HFA) 108 (90 Base) MCG/ACT inhaler Inhale 2 puffs into the lungs every 6 (six) hours as needed for wheezing or shortness of breath.    [provider]  fluticasone-salmeterol (ADVAIR HFA) 230-21 MCG/ACT inhaler Inhale 2 puffs into the lungs 2 (two) times daily.    [provider]  ipratropium-albuterol (DUONEB) 0.5-2.5 (3) MG/3ML SOLN Take 3 mLs by nebulization every 6 (six) hours as needed. 03/27/17   Fisher, Roselyn Bering, [name]  montelukast (SINGULAIR) 10 MG tablet Take 10 mg by mouth at bedtime.    [provider]  predniSONE (DELTASONE)  20 MG tablet Take 1 tablet (20 mg total) by mouth daily with breakfast. 03/27/17   Sherrie Mustache, Roselyn Bering, [name]  predniSONE (STERAPRED UNI-PAK 48 TAB) 10 MG (48) TBPK tablet Take 6 pills for 2 days then decrease by 1 pill every 2 days 03/27/17   [name], [name]    Family History History reviewed. No pertinent family history.  Social History Social History   Tobacco Use  . Smoking status: Current Every Day Smoker  . Smokeless tobacco: Never Used  Substance Use Topics  . Alcohol use: No    Frequency: Never  . Drug use: Not on file     Allergies   Penicillins; Banana; and Eggs or egg-derived products   Review of Systems Review of Systems  Respiratory: Positive for shortness of breath.   All other systems reviewed and are negative.    Physical Exam Updated Vital Signs BP (!) 161/102   Pulse (!) 134   Temp 99.7 F (37.6 C) (Oral)   Resp (!) 34   Wt 71.6 kg   SpO2 95%   BMI 26.27 kg/m   Physical Exam  Constitutional: He is oriented to person, place, and time.  Moderate distress, tachypneic, sitting up, some retractions.   HENT:  Head: Normocephalic.  Mouth/Throat: Oropharynx is clear and moist.  Eyes: Pupils are equal, round, and reactive to light. EOM are normal.  Neck: Normal range of motion.  Cardiovascular:  Tachycardic, regular   Pulmonary/Chest:  Tachypneic, sitting up, + wheezing with retractions, some accessory muscle use   Abdominal: Soft. Bowel sounds are normal.  Musculoskeletal: Normal range of motion.       Right lower leg: Normal.       Left lower leg: Normal.  Neurological: He is alert and oriented to person, place, and time.  Skin: Skin is warm. Capillary refill takes less than 2 seconds.  Psychiatric: He has a normal mood and affect. His behavior is normal.  Nursing note and vitals reviewed.    ED Treatments / Results  Labs (all labs ordered are listed, but only abnormal results are displayed) Labs Reviewed  COMPREHENSIVE METABOLIC PANEL -  Abnormal; Notable for the following components:      Result Value   Calcium 8.6 (*)    AST 14 (*)    All other components within normal limits  CBC WITH DIFFERENTIAL/PLATELET - Abnormal; Notable for the following components:   WBC 40.7 (*)    MCH 25.9 (*)    RDW 16.9 (*)    Platelets 449 (*)    All other components within normal limits  BLOOD GAS, VENOUS - Abnormal; Notable for the following components:   pO2, Ven 72.0 (*)    Bicarbonate 31.7 (*)    Acid-Base Excess 5.7 (*)    All other components within normal limits  CULTURE, BLOOD (ROUTINE X 2)  CULTURE, BLOOD (ROUTINE X 2)  URINE CULTURE  TROPONIN I  LACTIC ACID, PLASMA  MAGNESIUM  URINALYSIS, ROUTINE W REFLEX MICROSCOPIC  LACTIC ACID, PLASMA    EKG EKG Interpretation  Date/Time:  Wednesday December 05 2017 07:25:23 EDT Ventricular Rate:  146 PR Interval:    QRS Duration: 80 QT Interval:  247 QTC Calculation: 385 R Axis:   84 Text Interpretation:  Sinus tachycardia Ventricular premature complex Aberrant complex Consider right atrial enlargement Anteroseptal infarct, old Minimal ST depression, lateral leads No previous ECGs available Confirmed by Richardean Canal ([id number]) on 12/05/2017 8:38:13 AM   Radiology Dg Chest Port 1 View  Result Date: 12/05/2017 CLINICAL DATA:  Shortness of breath EXAM: PORTABLE CHEST 1 VIEW COMPARISON:  March 27, 2017 FINDINGS: There is no appreciable edema or consolidation. The heart size and pulmonary vascularity are normal. No adenopathy. There are multiple old healed rib fractures on the right with remodeling. IMPRESSION: Multiple old healed rib fractures on the right. No edema or consolidation. Stable cardiac silhouette. Electronically Signed   By: Bretta Bang III M.D.   On: 12/05/2017 07:52    Procedures Procedures (including critical care time)  CRITICAL CARE Performed by: Richardean Canal   Total critical care time: 30 minutes  Critical care time was exclusive of separately  billable procedures and treating other patients.  Critical care was necessary to treat or prevent imminent or life-threatening deterioration.  Critical care was time spent personally by me on the following activities: development of treatment plan with patient and/or surrogate as well as nursing, discussions with consultants, evaluation of patient's response to treatment, examination of patient, obtaining history from patient or surrogate, ordering and performing treatments and interventions, ordering and review of laboratory studies, ordering and review of radiographic studies, pulse oximetry and re-evaluation of patient's condition.   Medications Ordered in ED Medications  levofloxacin (LEVAQUIN) IVPB 750 mg (750 mg Intravenous New Bag/Given 12/05/17 0746)  albuterol (PROVENTIL) (2.5 MG/3ML) 0.083% nebulizer solution (has no administration in time range)  iopamidol (ISOVUE-300) 61 % injection 30 mL (has no  administration in time range)  magnesium sulfate IVPB 2 g 50 mL (has no administration in time range)  sodium chloride 0.9 % bolus 1,000 mL (1,000 mLs Intravenous New Bag/Given 12/05/17 0745)    And  sodium chloride 0.9 % bolus 1,000 mL (1,000 mLs Intravenous New Bag/Given 12/05/17 0737)    And  sodium chloride 0.9 % bolus 250 mL (0 mLs Intravenous Stopped 12/05/17 0801)  methylPREDNISolone sodium succinate (SOLU-MEDROL) 125 mg/2 mL injection 125 mg (125 mg Intravenous Given 12/05/17 0736)  albuterol (PROVENTIL,VENTOLIN) solution continuous neb (15 mg/hr Nebulization Given 12/05/17 0753)  ipratropium (ATROVENT) nebulizer solution 0.5 mg (0.5 mg Nebulization Given 12/05/17 0736)     Initial Impression / Assessment and Plan / ED Course  I have reviewed the triage vital signs and the nursing notes.  Pertinent labs & imaging results that were available during my care of the patient were reviewed by me and considered in my medical decision making (see chart for details).     Christine Schiefelbein is a 37 y.o. male hx of asthma on chronic prednisone here with SOB, cough, wheezing. Moderate respiratory distress and has low grade temp. Consider sepsis from pneumonia vs asthma exacerbation. Will get labs, VBG, lactate, cultures, CXR. Will initiate sepsis workup and give 30 cc/kg bolus and empiric abx for CAP.   11:40 AM Patient had WBC of 40. He is on chronic prednisone but this is higher than I expect. Still tachycardic after 30 cc/kg bolus. CTA showed no PE but there is multi focal pneumonia vs cavitation. Denies any recent travel of hx of TB. Ordered AFB, quantiferon, and HIV. Hospitalist to admit for pneumonia, asthma exacerbation, r/o TB.    Final Clinical Impressions(s) / ED Diagnoses   Final diagnoses:  None    ED Discharge Orders    None       Charlynne Pander, MD 12/05/17 1145

## 2017-12-05 NOTE — Plan of Care (Signed)

## 2017-12-05 NOTE — ED Notes (Signed)
Consulting provider gave verbal order for sputum cultures at a maximus of q8hr

## 2017-12-05 NOTE — ED Notes (Signed)
Floor unable to take report at this time.

## 2017-12-05 NOTE — Consult Note (Signed)
NAME: Joseph [name]  DOB: [birth year]  MRN: [id number]  Date/Time: 12/05/2017 6:29 PM  Luberta Mutter Subjective:  REASON FOR CONSULT: Septic emboli to the lungs ?History from patient and wife.  [facility] records reviewed thoroughly Connar Keating is a 37 y.o. male with a history of severe persistent asthma COPD overlap syndrome, steroid-dependent, allergic rhinitis, elevated IgE, eczema followed at [facility] by the pulmonary and allergy team is admitted with fevers since Sunday, shortness of breath since last night.  Patient came to the ED this morning because he was feeling very short of breath since last night.  Patient has had asthma since childhood with elevated IgE and eosinophils and has been treated with steroids.  He has also been on Xolair ,nucala which was stopped because of side effects.  He was started on dupilumab which is an interleukin-4 receptor antagonist on November 01, 2017.  He gets the injections every 2 weeks.  He states after every injection he has had some fever.  The last injection was on 11/29/2017.  He started having fever on Sunday.  He felt hot but did not check his temperature and he also had severe night sweats.  Then he started getting shortness of breath last night.  He also has a cough with gray sputum.  Patient and his wife states that he is always had wheezing.  He takes steroids which is usually 20mg  as needed it is increased.  He is currently on 30 mg.  In the ED his temperature was 99.7 blood pressure was 125/109 heart rate was 145 and respiratory rate was 28.  Lab work revealed a WBC of 40,000, eosinophil percentage was 0, hemoglobin of 14.8 and creatinine was 0.87.  Chest x-ray showed old healed rib fractures on the right side.  But CT angios showed areas of patchy airspace opacity throughout the lungs somewhat diffuse with multiple areas of cavitation suspect multifocal septic emboli with associated pneumonitis.  He was started on vancomycin and Levaquin as he has an allergy to  penicillin.  I am asked to see the patient for the same. Patient has not had any travel.  He lives in [place] for the last month before that he was in [place].  He has no pets at home He has not had any outdoor activity like yard work, Architect, fishing or swimming.  He has no sick contacts.  He lives with his wife.  He is a smoker and also smokes marijuana.  He does not vape or use electronic cigarette.  He  denies any intravenous drug use.  He was not recently hospitalized.  He does not have any port or Hickman catheter.  He does not have any sore throat.  He has no trouble swallowing.  He has some acid reflux.  He had been intubated once in 2003. Past Medical History:  Diagnosis Date  . Asthma   . Eczema   Essential hypertension History reviewed. No pertinent surgical history.    Social history He used to working retail.  Now retired.  He used to smoke until a month ago. He still smokes marijuana.  He does not use any e-cigarette or vape. No alcohol Family history History of asthma and mother and other siblings Bipolar disorder mother Allergies  Allergen Reactions  . Penicillins Shortness Of Breath  . Banana Hives  . Eggs Or Egg-Derived Products Hives   ? Current Facility-Administered Medications  Medication Dose Route Frequency Provider Last Rate Last Dose  . 0.9 %  sodium chloride infusion   Intravenous  Continuous Katha Hamming, MD 100 mL/hr at 12/05/17 1542    . acetaminophen (TYLENOL) tablet 650 mg  650 mg Oral Q6H PRN Katha Hamming, MD       Or  . acetaminophen (TYLENOL) suppository 650 mg  650 mg Rectal Q6H PRN Katha Hamming, MD      . bisacodyl (DULCOLAX) EC tablet 5 mg  5 mg Oral Daily PRN Katha Hamming, MD      . docusate sodium (COLACE) capsule 100 mg  100 mg Oral BID Katha Hamming, MD   100 mg at 12/05/17 1544  . enoxaparin (LOVENOX) injection 40 mg  40 mg Subcutaneous Q24H Katha Hamming, MD      . famotidine (PEPCID) IVPB  20 mg premix  20 mg Intravenous Q12H Katha Hamming, MD   Stopped at 12/05/17 1616  . HYDROcodone-acetaminophen (NORCO/VICODIN) 5-325 MG per tablet 1-2 tablet  1-2 tablet Oral Q4H PRN Katha Hamming, MD      . ipratropium-albuterol (DUONEB) 0.5-2.5 (3) MG/3ML nebulizer solution 3 mL  3 mL Nebulization Q4H Katha Hamming, MD   3 mL at 12/05/17 1554  . [START ON 12/06/2017] levofloxacin (LEVAQUIN) IVPB 750 mg  750 mg Intravenous Q24H Little Ishikawa, RPH      . methylPREDNISolone sodium succinate (SOLU-MEDROL) 125 mg/2 mL injection 60 mg  60 mg Intravenous Q24H Katha Hamming, MD      . mometasone-formoterol [facility]) 200-5 MCG/ACT inhaler 2 puff  2 puff Inhalation BID Katha Hamming, MD   2 puff at 12/05/17 1821  . montelukast (SINGULAIR) tablet 10 mg  10 mg Oral QHS Katha Hamming, MD      . ondansetron (ZOFRAN) tablet 4 mg  4 mg Oral Q6H PRN Katha Hamming, MD       Or  . ondansetron (ZOFRAN) injection 4 mg  4 mg Intravenous Q6H PRN Katha Hamming, MD      . vancomycin (VANCOCIN) 1,250 mg in sodium chloride 0.9 % 250 mL IVPB  1,250 mg Intravenous Q12H Nesbitt, Christopher A, RPH         Abtx:  Anti-infectives (From admission, onward)   Start     Dose/Rate Route Frequency Ordered Stop   12/06/17 0800  levofloxacin (LEVAQUIN) IVPB 750 mg     750 mg 100 mL/hr over 90 Minutes Intravenous Every 24 hours 12/05/17 1309     12/05/17 1900  vancomycin (VANCOCIN) 1,250 mg in sodium chloride 0.9 % 250 mL IVPB     1,250 mg 166.7 mL/hr over 90 Minutes Intravenous Every 12 hours 12/05/17 1309     12/05/17 1245  vancomycin (VANCOCIN) IVPB 1000 mg/200 mL premix     1,000 mg 200 mL/hr over 60 Minutes Intravenous  Once 12/05/17 1231 12/05/17 1403   12/05/17 0745  levofloxacin (LEVAQUIN) IVPB 750 mg     750 mg 100 mL/hr over 90 Minutes Intravenous  Once 12/05/17 0730 12/05/17 0916      REVIEW OF SYSTEMS:  Const: fever, negative chills, fluctuating  weight eyes: negative diplopia or visual changes, negative eye pain ENT: negative coryza, negative sore throat Resp:  cough, no hemoptysis, positive dyspnea Cards: Positive chest pain on the left side, palpitations, no lower extremity edema GU: negative for frequency, dysuria and hematuria GI: Negative for abdominal pain, one episode of diarrhea ,no bleeding, constipation Skin: Eczema skin heme: negative for easy bruising and gum/nose bleeding MS: Positive myalgias, arthralgias, back pain and muscle weakness Neurolo: has headaches, no dizziness, vertigo, memory problems  Psych: negative for feelings of anxiety, depression  Endocrine: No polyuria or polydipsia Allergy/Immunology-multiple food and environmental allergies ?  Objective:  VITALS:  BP (!) 147/99 (BP Location: Left Arm)   Pulse (!) 101   Temp 98.6 F (37 C) (Oral)   Resp 18   Ht 5\' 5"  (1.651 m)   Wt 71.6 kg   SpO2 97%   BMI 26.27 kg/m  PHYSICAL EXAM:  General: Alert, cooperative, in respiratory distress, appears stated age.  Head: Normocephalic, without obvious abnormality, atraumatic. Eyes: Conjunctivae clear, anicteric sclerae. Pupils are equal ENT Nares normal. No drainage or sinus tenderness. Lips, mucosa, and tongue normal. No Thrush Only 4 lower teeth present Neck: Supple, symmetrical, no adenopathy, thyroid: non tender no carotid bruit and no JVD. Back: No CVA tenderness. Lungs: Bilateral rhonchi. Heart: Tachycardia, no murmur  abdomen: Soft, non-tender,not distended. Bowel sounds normal. No masses Extremities: atraumatic, no cyanosis. No edema. No clubbing Skin: Hyperpigmented spots legs Lymph: Cervical, supraclavicular normal. Neurologic: Grossly non-focal Pertinent Labs CBC Latest Ref Rng & Units 12/05/2017  WBC 4.0 - 10.5 K/uL 40.7(H)  Hemoglobin 13.0 - 17.0 g/dL 16.1  Hematocrit 09.6 - 52.0 % 47.6  Platelets 150 - 400 K/uL 449(H)   CMP Latest Ref Rng & Units 12/05/2017  Glucose 70 - 99 mg/dL 96    BUN 6 - 20 mg/dL 12  Creatinine 0.45 - 4.09 mg/dL 8.11  Sodium 914 - 782 mmol/L 140  Potassium 3.5 - 5.1 mmol/L 3.9  Chloride 98 - 111 mmol/L 101  CO2 22 - 32 mmol/L 30  Calcium 8.9 - 10.3 mg/dL 9.5(A)  Total Protein 6.5 - 8.1 g/dL 6.9  Total Bilirubin 0.3 - 1.2 mg/dL 1.0  Alkaline Phos 38 - 126 U/L 58  AST 15 - 41 U/L 14(L)  ALT 0 - 44 U/L 13   IMAGING RESULTS: ? Impression/Recommendation ? 37 y.o. male with a history of severe persistent asthma COPD overlap syndrome, steroid-dependent, allergic rhinitis, elevated IgE, eczema followed at [facility] by the pulmonary and allergy team is admitted with fevers since Sunday, shortness of breath since last night.  Patient came to the ED this morning because he was feeling very short of breath since last night.  Patient has had asthma since childhood with elevated IgE and eosinophils and has been treated with steroids.  He has also been on Xolair ,nucala which was stopped because of side effects.  He was started on dupilumab which is an interleukin-4 receptor antagonist on November 01, 2017.  He gets the injections every 2 weeks.  He states after every injection he has had some fever.  The last injection was on 11/29/2017.  He started having fever on Sunday. ? ?Multiple nodular and cavitating lesion in the lungs bilaterally patient with asthma COPD overlap on dupilumab and prednisone. This picture resembles a septic emboli to the lungs which usually could be from the heart like tricuspid infective endocarditis or Lemirres syndrome which is internal jugular vein thrombosis from Fusobacterium.  He does not have any risk factor for endocarditis.  He he does not abuse IV drugs.  There is no murmur.  He also does not have a sore throat or any cord like structures in the neck.  So  these 2 conditions are unlikely. He is immunocompromised so unusual organisms like nocardia is a possibility but the presentation is very acute. PJP less likely  regular  community-acquired pneumonia like pneumococcus or Legionella is possible? Or is this a side effect of dupilumab causing eosinophilic granulomatosis with polyangiitis leading to nodularity in the  lung and cavitation? This is not TB.  TB does not present with this kind of a clinical picture and imaging as well. Currently on vancomycin and Levaquin. Get 2D echo Urine tox screen MRSA nares HIV testing  Severe asthma, eczema and allergic  rhinitis  IgE mediated  Severe leukocytosis.  He has always had leukocytosis since 2016.  And one time in 2016 there was a mention of smudge cells in the peripheral smear.  Smudge cells can be seen in CLL and also degenerating samples.  So we will ask pathology lab to do a peripheral smear and look for smudge cell ___________________________________________________ Discussed with patient, his wife and requesting provider. Will discuss with [facility] pulmonologist Note:  This document was prepared using Dragon voice recognition software and may include unintentional dictation errors.

## 2017-12-06 ENCOUNTER — Inpatient Hospital Stay
Admit: 2017-12-06 | Discharge: 2017-12-06 | Disposition: A | Discharge status: Home / Self Care | Payer: Medicare Other | Attending: Internal Medicine | Admitting: Internal Medicine

## 2017-12-06 LAB — BASIC METABOLIC PANEL
Anion gap: 6 (ref 5–15)
BUN: 16 mg/dL (ref 6–20)
CALCIUM: 8 mg/dL — AB (ref 8.9–10.3)
CO2: 29 mmol/L (ref 22–32)
CREATININE: 0.8 mg/dL (ref 0.61–1.24)
Chloride: 106 mmol/L (ref 98–111)
GFR calc non Af Amer: >60 mL/min (ref >60)
GLUCOSE: 79 mg/dL (ref 70–99)
Potassium: 3.5 mmol/L (ref 3.5–5.1)
SODIUM: 141 mmol/L (ref 135–145)

## 2017-12-06 LAB — CBC
HCT: 40.4 % (ref 39.0–52.0)
Hemoglobin: 12.5 g/dL — ABNORMAL LOW (ref 13.0–17.0)
MCH: 25.9 pg — AB (ref 26.0–34.0)
MCHC: 30.9 g/dL (ref 30.0–36.0)
MCV: 83.6 fL (ref 80.0–100.0)
PLATELETS: 349 10*3/uL (ref 150–400)
RBC: 4.83 MIL/uL (ref 4.22–5.81)
RDW: 16.6 % — AB (ref 11.5–15.5)
WBC: 33.3 10*3/uL — ABNORMAL HIGH (ref 4.0–10.5)
nRBC: 0 % (ref 0.0–0.2)

## 2017-12-06 LAB — ECHOCARDIOGRAM COMPLETE
HEIGHTINCHES: 65 in
Weight: 2499.2 oz

## 2017-12-06 LAB — URINE CULTURE: Culture: <10000 — AB

## 2017-12-06 LAB — MAGNESIUM: MAGNESIUM: 2.1 mg/dL (ref 1.7–2.4)

## 2017-12-06 LAB — URINE DRUG SCREEN, QUALITATIVE (ARMC ONLY)
Amphetamines, Ur Screen: NOT DETECTED
Barbiturates, Ur Screen: NOT DETECTED
Benzodiazepine, Ur Scrn: NOT DETECTED
Cannabinoid 50 Ng, Ur ~~LOC~~: POSITIVE — AB
Cocaine Metabolite,Ur ~~LOC~~: NOT DETECTED
MDMA (ECSTASY) UR SCREEN: NOT DETECTED
METHADONE SCREEN, URINE: NOT DETECTED
OPIATE, UR SCREEN: NOT DETECTED
Phencyclidine (PCP) Ur S: NOT DETECTED
TRICYCLIC, UR SCREEN: NOT DETECTED

## 2017-12-06 LAB — GLUCOSE, CAPILLARY: Glucose-Capillary: 108 mg/dL — ABNORMAL HIGH (ref 70–99)

## 2017-12-06 LAB — HIV ANTIBODY (ROUTINE TESTING W REFLEX): HIV Screen 4th Generation wRfx: NONREACTIVE

## 2017-12-06 MED ORDER — ADULT MULTIVITAMIN W/MINERALS CH
1.0000 | ORAL_TABLET | Freq: Every day | ORAL | Status: DC
  Administered 2017-12-06 – 2017-12-07 (×2): 1 via ORAL
  Filled 2017-12-06 (×2): qty 1

## 2017-12-06 MED ORDER — GUAIFENESIN ER 600 MG PO TB12
600.0000 mg | ORAL_TABLET | Freq: Two times a day (BID) | ORAL | Status: DC
  Administered 2017-12-06 – 2017-12-07 (×3): 600 mg via ORAL
  Filled 2017-12-06 (×3): qty 1

## 2017-12-06 MED ORDER — ENSURE ENLIVE PO LIQD
237.0000 mL | Freq: Two times a day (BID) | ORAL | Status: DC
  Administered 2017-12-06 – 2017-12-07 (×3): 237 mL via ORAL

## 2017-12-06 MED ORDER — VANCOMYCIN HCL 10 G IV SOLR
1500.0000 mg | Freq: Two times a day (BID) | INTRAVENOUS | Status: DC
  Administered 2017-12-06 – 2017-12-07 (×2): 1500 mg via INTRAVENOUS
  Filled 2017-12-06 (×2): qty 1500

## 2017-12-06 NOTE — Care Management Note (Signed)
Case Management Note  Patient Details  Name: Joseph [name] MRN: [id number] Date of Birth: 1980/11/28  Subjective/Objective: Patient independent from home.  Lives with wife and 2 sons.  Admitted with Sepsis.  Chronic O2 at home during night and as needed per patient.  Currently on IV antibiotics, IV steroids and nebulizer's.   Denies difficulty obtaining medications or with transportation.  Current with PCP.                       Action/Plan: No needs identified by CM.   Expected Discharge Date:                  Expected Discharge Plan:  Home/Self Care  In-House Referral:     Discharge planning Services  CM Consult  Post Acute Care Choice:    Choice offered to:     DME Arranged:    DME Agency:     HH Arranged:    HH Agency:     Status of Service:  Completed, signed off  If discussed at [employer] of Stay Meetings, dates discussed:    Additional Comments:  Sherren Kerns, RN 12/06/2017, 4:56 PM

## 2017-12-06 NOTE — Progress Notes (Addendum)
[employer] - [place] at [facility]   PATIENT NAME: Joseph [name]    MR#:  [id number]  DATE OF BIRTH:  1981/01/03  SUBJECTIVE:  CHIEF COMPLAINT:   Chief Complaint  Patient presents with  . Shortness of Breath   Better shortness of breath and cough, on oxygen by nasal cannula 4 L this morning. REVIEW OF SYSTEMS:  Review of Systems  Constitutional: Positive for malaise/fatigue. Negative for chills and fever.  HENT: Negative for sore throat.   Eyes: Negative for blurred vision and double vision.  Respiratory: Positive for cough, shortness of breath and wheezing. Negative for hemoptysis and stridor.   Cardiovascular: Negative for chest pain, palpitations, orthopnea and leg swelling.  Gastrointestinal: Negative for abdominal pain, blood in stool, diarrhea, melena, nausea and vomiting.  Genitourinary: Negative for dysuria, flank pain and hematuria.  Musculoskeletal: Negative for back pain and joint pain.  Skin: Negative for rash.  Neurological: Negative for dizziness, sensory change, focal weakness, seizures, loss of consciousness, weakness and headaches.  Endo/Heme/Allergies: Negative for polydipsia.  Psychiatric/Behavioral: Negative for depression. The patient is not nervous/anxious.     DRUG ALLERGIES:   Allergies  Allergen Reactions  . Penicillins Shortness Of Breath  . Banana Hives  . Eggs Or Egg-Derived Products Hives   VITALS:  Blood pressure (!) 134/95, pulse 84, temperature 98.2 F (36.8 C), temperature source Oral, resp. rate 14, height 5\' 5"  (1.651 m), weight 70.9 kg, SpO2 96 %. PHYSICAL EXAMINATION:  Physical Exam  Constitutional: He is oriented to person, place, and time. He appears well-nourished. No distress.  HENT:  Head: Normocephalic.  Mouth/Throat: Oropharynx is clear and moist.  Eyes: Pupils are equal, round, and reactive to light. Conjunctivae and EOM are normal. No scleral icterus.  Neck: Normal range of motion. Neck supple. No JVD  present. No tracheal deviation present.  Cardiovascular: Normal rate, regular rhythm and normal heart sounds. Exam reveals no gallop.  No murmur heard. Pulmonary/Chest: Effort normal. No stridor. No respiratory distress. He has wheezes. He has no rales.  Rhonchi  Abdominal: Soft. Bowel sounds are normal. He exhibits no distension. There is no tenderness. There is no rebound.  Musculoskeletal: Normal range of motion. He exhibits no edema or tenderness.  Neurological: He is alert and oriented to person, place, and time. No cranial nerve deficit.  Skin: No rash noted. No erythema.  Psychiatric: He has a normal mood and affect.   LABORATORY PANEL:  Male CBC Recent Labs  Lab 12/06/17 0657  WBC 33.3*  HGB 12.5*  HCT 40.4  PLT 349   ------------------------------------------------------------------------------------------------------------------ Chemistries  Recent Labs  Lab 12/05/17 0731 12/06/17 0657  NA 140 141  K 3.9 3.5  CL 101 106  CO2 30 29  GLUCOSE 96 79  BUN 12 16  CREATININE 0.87 0.80  CALCIUM 8.6* 8.0*  MG 1.7 2.1  AST 14*  --   ALT 13  --   ALKPHOS 58  --   BILITOT 1.0  --    RADIOLOGY:  No results found. ASSESSMENT AND PLAN:  37 year old male patient with history of tobacco abuse, moderate persistent asthma on chronic steroids, bronchodilators comes in because of worsening shortness of breath, cough, fever, subjective chills for 2 days and found to have sepsis with a sinus tachycardia with heart rate 140 bpm, with moderate respiratory distress on arrival and now on 3 L of oxygen and saturation 96%.  CT angios chest showed no PE but concerning for multifocal pneumonia, septic emboli, focal cavitary  pneumonia, duodenitis, gastritis on the CT abdomen.  1.  Sepsis present on admission due to multifocal pneumonia, has evidence of sinus tachycardia, severely elevated white count up to 40, unable to exclude septic emboli:  Continue IV antibiotics with Vanco and  levaquin, Mucinex, Robitussin as needed, follow sputum and blood cultures.  2 D echo: LV EF: 55% -   60%,  This is not TB per ID Dr. Joylene Draft.  Leukocytosis, possible due to combination of steroid and pulmonary infection.  Follow-up CBC.  2.  Persistent asthma  COPD overlap with exacerbation.  Continue oxygen, bronchodilators, IV steroids.  3.  Focal cavitary lesion,This is not TB per ID Dr. Joylene Draft.  #4 Acute gastritis, duodenitis: Continue PPIs.  Improved.  Smoking, offered assistance, smoking cessation counseling done   All the records are reviewed and case discussed with Care Management/Social Worker. Management plans discussed with the patient, family and they are in agreement.  CODE STATUS: Full Code  TOTAL TIME TAKING CARE OF THIS PATIENT: 35 minutes.   More than 50% of the time was spent in counseling/coordination of care: YES  POSSIBLE D/C IN 2-3 DAYS, DEPENDING ON CLINICAL CONDITION.   Shaune Pollack M.D on 12/06/2017 at 1:17 PM  Between 7am to 6pm - Pager - [phone]  After 6pm go to [email or web address] - Therapist, nutritional Hospitalists

## 2017-12-06 NOTE — Progress Notes (Signed)
*  PRELIMINARY RESULTS* Echocardiogram 2D Echocardiogram has been performed.  Cristela Blue 12/06/2017, 9:57 AM

## 2017-12-06 NOTE — Progress Notes (Signed)
Joseph [name] is a 37 y.o. male with a history of severe persistent asthma COPD overlap syndrome, steroid-dependent, allergic rhinitis, elevated IgE, eczema followed at [facility] by the pulmonary and allergy team is admitted with fevers since Sunday, shortness of breath since last night.  Patient came to the ED this morning because he was feeling very short of breath since last night.  Patient has had asthma since childhood with elevated IgE and eosinophils and has been treated with steroids.  He has also been on Xolair ,nucala which was stopped because of side effects.  He was started on dupilumab which is an interleukin-4 receptor antagonist on November 01, 2017.  He gets the injections every 2 weeks.  He states after every injection he has had some fever.  The last injection was on 11/29/2017.  He started having fever on Sunday.  He felt hot but did not check his temperature and he also had severe night sweats.  Then he started getting shortness of breath last night.  He also has a cough with gray sputum.  Patient and his wife states that he is always had wheezing.  He takes steroids which is usually 20mg  as needed it is increased.  He is currently on 30 mg.  In the ED his temperature was 99.7 blood pressure was 125/109 heart rate was 145 and respiratory rate was 28.  Lab work revealed a WBC of 40,000, eosinophil percentage was 0, hemoglobin of 14.8 and creatinine was 0.87.  Chest x-ray showed old healed rib fractures on the right side.  But CT angios showed areas of patchy airspace opacity throughout the lungs somewhat diffuse with multiple areas of cavitation suspect multifocal septic emboli with associated pneumonitis.  He was started on vancomycin and Levaquin as he has an allergy to penicillin.  I am asked to see the patient for the same. Patient has not had any travel.  He lives in [place] for the last month before that he was in [place].  He has no pets at home He has not had any outdoor activity  like yard work, Architect, fishing or swimming.  He has no sick contacts.  He lives with his wife.  He is a smoker and also smokes marijuana.  He does not vape or use electronic cigarette.  He  denies any intravenous drug use.  He was not recently hospitalized.  He does not have any port or Hickman catheter.  He does not have any sore throat.  He has no trouble swallowing.  He has some acid reflux.  He had been intubated once in 2003.  Subjective  Doing much better No fever Cough and breathing better  Objective:  VITALS:  BP (!) 152/91 (BP Location: Right Arm)   Pulse 90   Temp 98.4 F (36.9 C) (Oral)   Resp 18   Ht 5\' 5"  (1.651 m)   Wt 70.9 kg   SpO2 98%   BMI 25.99 kg/m  PHYSICAL EXAM:  General:looks well no resp distress Head: Normocephalic, without obvious abnormality, atraumatic. Eyes: Conjunctivae clear, anicteric sclerae. Pupils are equal ENT Nares normal. No drainage or sinus tenderness. Lips, mucosa, and tongue normal. No Thrush Only 4 lower teeth present Neck: Supple, symmetrical, no adenopathy, thyroid: non tender no carotid bruit and no JVD. Back: No CVA tenderness. Lungs: very few rhonchi, air entry better Heart: s1s2 abdomen: Soft, non-tender,not distended. Bowel sounds normal. No masses Extremities: atraumatic, no cyanosis. No edema. No clubbing Skin: Hyperpigmented spots legs Lymph: Cervical, supraclavicular normal. Neurologic:  Grossly non-focal Pertinent Labs CBC Latest Ref Rng & Units 12/06/2017 12/05/2017  WBC 4.0 - 10.5 K/uL 33.3(H) 40.7(H)  Hemoglobin 13.0 - 17.0 g/dL 12.5(L) 14.8  Hematocrit 39.0 - 52.0 % 40.4 47.6  Platelets 150 - 400 K/uL 349 449(H)   CMP Latest Ref Rng & Units 12/06/2017 12/05/2017  Glucose 70 - 99 mg/dL 79 96  BUN 6 - 20 mg/dL 16 12  Creatinine 4.09 - 1.24 mg/dL 8.11 9.14  Sodium 782 - 145 mmol/L 141 140  Potassium 3.5 - 5.1 mmol/L 3.5 3.9  Chloride 98 - 111 mmol/L 106 101  CO2 22 - 32 mmol/L 29 30  Calcium 8.9 - 10.3 mg/dL  8.0(L) 8.6(L)  Total Protein 6.5 - 8.1 g/dL - 6.9  Total Bilirubin 0.3 - 1.2 mg/dL - 1.0  Alkaline Phos 38 - 126 U/L - 58  AST 15 - 41 U/L - 14(L)  ALT 0 - 44 U/L - 13   IMAGING RESULTS: ? Impression/Recommendation ? 37 y.o. male with a history of severe persistent asthma COPD overlap syndrome, steroid-dependent, allergic rhinitis, elevated IgE, eczema followed at [facility] [place] by the pulmonary and allergy team is admitted with fevers since Sunday, shortness of breath since last night.  Patient came to the ED this morning because he was feeling very short of breath since last night.  Patient has had asthma since childhood with elevated IgE and eosinophils and has been treated with steroids.  He has also been on Xolair ,nucala which was stopped because of side effects.  He was started on dupilumab which is an interleukin-4 receptor antagonist on November 01, 2017.  He gets the injections every 2 weeks.  He states after every injection he has had some fever.  The last injection was on 11/29/2017.  He started having fever on Sunday. ? ?Multiple nodular and cavitating lesion in the lungs bilaterally in patient with asthma COPD overlap on dupilumab and prednisone. This picture resembles a septic emboli to the lungs which usually could be from the heart like tricuspid infective endocarditis or Lemirres syndrome but he does not have botht he condition- 2 d echo valves okay, blood culture negative He is immunocompromised so unusual organisms like nocardia is a possibility but the presentation is very acute. PJP less likely  regular community-acquired pneumonia like pneumococcus or Legionella is possible but does not present radiologically like this, This is not TB.  TB does not present with this kind of a clinical picture and imaging as well. HIV NR Urine tox- cannabinoids as expected   This could be  a side effect of dupilumab causing eosinophilic granulomatosis with polyangiitis leading to nodularity in the  lungs and cavitation. HE is better now with IV steroids, Pulmonary consult recommended Also left a message for his physician at [facility]  Currently on vancomycin and Levaquin. MRSA nares neg- if blood culture neg tomorrow will DC vancomycin.   Severe asthma, eczema and allergic  rhinitis  IgE mediated  Severe leukocytosis.  He has always had leukocytosis since 2016.  And one time in 2016 there was a mention of smudge cells in the peripheral smear.  Smudge cells can be seen in CLL and also degenerating samples.  So we will ask pathology lab to do a peripheral smear and look for smudge cell ___________________________________________________ Discussed with patient.

## 2017-12-06 NOTE — Consult Note (Signed)
Pharmacy Antibiotic Note - follow-up  Joseph [name] is a 37 y.o. male admitted on 12/05/2017 with pneumonia.  Pharmacy has been consulted for levofloxacin and vancomycin dosing.  Plan: Vancomycin 1000 IV once followed by 6 hour stack dosing originally vancomycin 1250 mg IV every 12 hours - adjusted to 1500mg  every 12 hours between 3rd and 4th dose (improving renal fxn)    Goal trough 15-20 mcg/mL.  Will draw trough prior to the sixth dose.  Continue Levofloxacin 750 mg IV every 24 hours  Height: 5\' 5"  (165.1 cm) Weight: 156 lb 3.2 oz (70.9 kg) IBW/kg (Calculated) : 61.5  Temp (24hrs), Avg:98.1 F (36.7 C), Min:97.8 F (36.6 C), Max:98.6 F (37 C)  Recent Labs  Lab 12/05/17 0731 12/06/17 0657  WBC 40.7* 33.3*  CREATININE 0.87 0.80  LATICACIDVEN 1.7  --     Estimated Creatinine Clearance: 107.8 mL/min (by C-G formula based on SCr of 0.8 mg/dL).    Allergies  Allergen Reactions  . Penicillins Shortness Of Breath  . Banana Hives  . Eggs Or Egg-Derived Products Hives    Antimicrobials this admission: Levofloxacin 10/23 >>  Vanco 10/23 >>   Dose adjustments this admission: 10/24 Dose adjusted from 1250mg  q 12h to 1500mg  q 12h.   This provides an estimated Cmin Steady State of 16.8, t 1/2 of 7.37, and a ke of 0.094.   Microbiology results: 10/23 BCx: NG x 1 day 10/23 UCx: NG 10/23 MRSA PCR: negative  Thank you for allowing pharmacy to be a part of this patient's care.  Albina Billet, PharmD Clinical Pharmacist 12/06/2017 11:46 AM

## 2017-12-06 NOTE — Progress Notes (Signed)
Received verbal orders from Dr. Rivka Safer to d/c Airborn Precautions. Per MD no need to collect anymore sputum samples. Will continue to monitor.

## 2017-12-06 NOTE — Plan of Care (Signed)

## 2017-12-07 LAB — GLUCOSE, CAPILLARY: GLUCOSE-CAPILLARY: 83 mg/dL (ref 70–99)

## 2017-12-07 LAB — CBC
HCT: 40.1 % (ref 39.0–52.0)
HEMOGLOBIN: 12.8 g/dL — AB (ref 13.0–17.0)
MCH: 25.9 pg — AB (ref 26.0–34.0)
MCHC: 31.9 g/dL (ref 30.0–36.0)
MCV: 81 fL (ref 80.0–100.0)
NRBC: 0 % (ref 0.0–0.2)
PLATELETS: 366 10*3/uL (ref 150–400)
RBC: 4.95 MIL/uL (ref 4.22–5.81)
RDW: 16.3 % — AB (ref 11.5–15.5)
WBC: 25.6 10*3/uL — ABNORMAL HIGH (ref 4.0–10.5)

## 2017-12-07 LAB — PROCALCITONIN: Procalcitonin: 0.2 ng/mL

## 2017-12-07 MED ORDER — METHYLPREDNISOLONE SODIUM SUCC 125 MG IJ SOLR
60.0000 mg | Freq: Two times a day (BID) | INTRAMUSCULAR | Status: DC
  Administered 2017-12-07: 60 mg via INTRAVENOUS
  Filled 2017-12-07: qty 2

## 2017-12-07 MED ORDER — LEVOFLOXACIN 750 MG PO TABS: 750.0000 mg | ORAL_TABLET | Freq: Every day | ORAL | 0 refills | Status: AC

## 2017-12-07 MED ORDER — GUAIFENESIN-DM 100-10 MG/5ML PO SYRP: 5.0000 mL | ORAL_SOLUTION | ORAL | 0 refills | Status: DC | PRN

## 2017-12-07 NOTE — Progress Notes (Addendum)
[employer] - [place] at [facility]   PATIENT NAME: Joseph [name]    MR#:  [id number]  DATE OF BIRTH:  1980/04/10  SUBJECTIVE:  CHIEF COMPLAINT:   Chief Complaint  Patient presents with  . Shortness of Breath   The patient feels much better, on oxygen as needed.  He wants to go home and decided to sign AMA. REVIEW OF SYSTEMS:  Review of Systems  Constitutional: Negative for chills, fever and malaise/fatigue.  HENT: Negative for sore throat.   Eyes: Negative for blurred vision and double vision.  Respiratory: Positive for cough and wheezing. Negative for hemoptysis, shortness of breath and stridor.   Cardiovascular: Negative for chest pain, palpitations, orthopnea and leg swelling.  Gastrointestinal: Negative for abdominal pain, blood in stool, diarrhea, melena, nausea and vomiting.  Genitourinary: Negative for dysuria, flank pain and hematuria.  Musculoskeletal: Negative for back pain and joint pain.  Skin: Negative for rash.  Neurological: Negative for dizziness, sensory change, focal weakness, seizures, loss of consciousness, weakness and headaches.  Endo/Heme/Allergies: Negative for polydipsia.  Psychiatric/Behavioral: Negative for depression. The patient is not nervous/anxious.     DRUG ALLERGIES:   Allergies  Allergen Reactions  . Penicillins Shortness Of Breath  . Banana Hives  . Eggs Or Egg-Derived Products Hives   VITALS:  Blood pressure (!) 152/91, pulse 96, temperature 97.9 F (36.6 C), temperature source Oral, resp. rate 18, height 5\' 5"  (1.651 m), weight 70.7 kg, SpO2 92 %. PHYSICAL EXAMINATION:  Physical Exam  Constitutional: He is oriented to person, place, and time. He appears well-nourished. No distress.  HENT:  Head: Normocephalic.  Mouth/Throat: Oropharynx is clear and moist.  Eyes: Pupils are equal, round, and reactive to light. Conjunctivae and EOM are normal. No scleral icterus.  Neck: Normal range of motion. Neck supple. No  JVD present. No tracheal deviation present.  Cardiovascular: Normal rate, regular rhythm and normal heart sounds. Exam reveals no gallop.  No murmur heard. Pulmonary/Chest: Effort normal. No stridor. No respiratory distress. He has wheezes. He has no rales.  Rhonchi  Abdominal: Soft. Bowel sounds are normal. He exhibits no distension. There is no tenderness. There is no rebound.  Musculoskeletal: Normal range of motion. He exhibits no edema or tenderness.  Neurological: He is alert and oriented to person, place, and time. No cranial nerve deficit.  Skin: No rash noted. No erythema.  Psychiatric: He has a normal mood and affect.   LABORATORY PANEL:  Male CBC Recent Labs  Lab 12/07/17 0517  WBC 25.6*  HGB 12.8*  HCT 40.1  PLT 366   ------------------------------------------------------------------------------------------------------------------ Chemistries  Recent Labs  Lab 12/05/17 0731 12/06/17 0657  NA 140 141  K 3.9 3.5  CL 101 106  CO2 30 29  GLUCOSE 96 79  BUN 12 16  CREATININE 0.87 0.80  CALCIUM 8.6* 8.0*  MG 1.7 2.1  AST 14*  --   ALT 13  --   ALKPHOS 58  --   BILITOT 1.0  --    RADIOLOGY:  No results found. ASSESSMENT AND PLAN:  37 year old male patient with history of tobacco abuse, moderate persistent asthma on chronic steroids, bronchodilators comes in because of worsening shortness of breath, cough, fever, subjective chills for 2 days and found to have sepsis with a sinus tachycardia with heart rate 140 bpm, with moderate respiratory distress on arrival and now on 3 L of oxygen and saturation 96%.  CT angios chest showed no PE but concerning for multifocal pneumonia, septic  emboli, focal cavitary pneumonia, duodenitis, gastritis on the CT abdomen.  1.  Sepsis present on admission due to multifocal pneumonia, has evidence of sinus tachycardia, severely elevated white count up to 40, unable to exclude septic emboli:  Continue levaquin and discontinued  vancomycin, Mucinex, Robitussin as needed, follow sputum and blood cultures.  2 D echo: LV EF: 55% -   60%,  This is not TB per ID Dr. Joylene Draft. Levaquin 5 more days p.o. if patient sign AMA per Dr. Joylene Draft  Leukocytosis, possible due to combination of steroid and pulmonary infection.  Improving.  Follow-up CBC.  2.  Persistent asthma  COPD overlap with exacerbation.  Continue oxygen as needed, bronchodilators, IV steroids.  3.  Focal cavitary lesion,This is not TB per ID Dr. Joylene Draft.  #4 Acute gastritis, duodenitis: Continue PPIs.  Improved.  Tobacco abuse.  Smoking cessation was counseled for 3 to 4 minutes. I advised the patient to stay in the hospital and get further treatment, he is not stable to be discharged but the patient decided AMA.  I discussed with Dr. Joylene Draft. All the records are reviewed and case discussed with Care Management/Social Worker. Management plans discussed with the patient, family and they are in agreement.  CODE STATUS: Full Code  TOTAL TIME TAKING CARE OF THIS PATIENT: 42 minutes.   More than 50% of the time was spent in counseling/coordination of care: YES  POSSIBLE D/C IN 2 DAYS, DEPENDING ON CLINICAL CONDITION.   Shaune Pollack M.D on 12/07/2017 at 3:08 PM  Between 7am to 6pm - Pager - [phone]  After 6pm go to [email or web address] - Therapist, nutritional Hospitalists

## 2017-12-07 NOTE — Consult Note (Addendum)
Pulmonary Medicine Consultation           Date: 12/07/2017,   MRN# [id number] Joseph [name] 1980/09/28 Code Status:     Code Status Orders  (From admission, onward)         Start     Ordered   12/05/17 1146  Full code  Continuous     12/05/17 1149        Code Status History    This patient has a current code status but no historical code status.     Hosp day:@LENGTHOFSTAYDAYS @ Referring MD: @ATDPROV @     PCP:      AdmissionWeight: 71.6 kg                 CurrentWeight: 70.7 kg Joseph [name] is a 37 y.o. old male seen in consultation for hypoxic respiratory failure at the request of Dr Imogene Burn.     CHIEF COMPLAINT:   Wheezing SOB cough   HISTORY OF PRESENT ILLNESS   37 y.o. 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  He is an active smoker of cigarretes 2 daily and smokes marijuana daily.  He states he started both at around 67-14 yrs old. He has a pulmonary doc that he is seeing on regular basis.  He came in with s/s of pneumonia and was admitted for this reason. He had been intubated once in the past many years ago.  Currently he feels better and he has been on solumedrol 60 as well as levofloxacin for CAP tx.  He reports expectorating dark colored phlegm.  Denies fevers, chills, diaphoresis, staes hes always wheezing even at baseline, denies hx of renal insuficiency, denies nasal crusting, denies joint disease   PAST MEDICAL HISTORY   Past Medical History:  Diagnosis Date  . Asthma   . Eczema      SURGICAL HISTORY   History reviewed. No pertinent surgical history.   FAMILY HISTORY   History reviewed. No pertinent family history.   SOCIAL HISTORY   Social History   Tobacco Use  . Smoking status: Current Every Day Smoker  . Smokeless tobacco: Never Used  Substance Use Topics  .  Alcohol use: No    Frequency: Never  . Drug use: Not on file     MEDICATIONS    Home Medication:  Current Outpatient Rx  . Order #: [id number] Class: Print  . Order #: [id number] Class: Print    Current Medication:  Current Facility-Administered Medications:  .  acetaminophen (TYLENOL) tablet 650 mg, 650 mg, Oral, Q6H PRN **OR** acetaminophen (TYLENOL) suppository 650 mg, 650 mg, Rectal, Q6H PRN, Katha Hamming, MD .  bisacodyl (DULCOLAX) EC tablet 5 mg, 5 mg, Oral, Daily PRN, Katha Hamming, MD .  docusate sodium (COLACE) capsule 100 mg, 100 mg, Oral, BID, Katha Hamming, MD, 100 mg at 12/07/17 0823 .  enoxaparin (LOVENOX) injection 40 mg, 40 mg, Subcutaneous, Q24H, Konidena, Snehalatha, MD .  famotidine (PEPCID) IVPB 20 mg premix, 20 mg, Intravenous, Q12H, Katha Hamming, MD, Last Rate: 100 mL/hr at 12/07/17 1316, 20 mg at 12/07/17 1316 .  feeding supplement (ENSURE ENLIVE) (ENSURE ENLIVE) liquid 237 mL, 237 mL, Oral, BID BM, Shaune Pollack, MD, 237 mL at 12/07/17 1313 .  guaiFENesin (MUCINEX) 12 hr tablet 600 mg, 600 mg, Oral, BID, Shaune Pollack, MD, 600 mg at 12/07/17 1324 .  guaiFENesin-dextromethorphan (  ROBITUSSIN DM) 100-10 MG/5ML syrup 5 mL, 5 mL, Oral, Q4H PRN, Delfino Lovett, MD, 5 mL at 12/05/17 2233 .  HYDROcodone-acetaminophen (NORCO/VICODIN) 5-325 MG per tablet 1-2 tablet, 1-2 tablet, Oral, Q4H PRN, Katha Hamming, MD .  ipratropium-albuterol (DUONEB) 0.5-2.5 (3) MG/3ML nebulizer solution 3 mL, 3 mL, Nebulization, Q4H, Katha Hamming, MD, 3 mL at 12/07/17 1449 .  levofloxacin (LEVAQUIN) IVPB 750 mg, 750 mg, Intravenous, Q24H, Little Ishikawa, RPH, Last Rate: 100 mL/hr at 12/07/17 0823, 750 mg at 12/07/17 0823 .  methylPREDNISolone sodium succinate (SOLU-MEDROL) 125 mg/2 mL injection 60 mg, 60 mg, Intravenous, Q12H, Shaune Pollack, MD, 60 mg at 12/07/17 0824 .  mometasone-formoterol (DULERA) 200-5 MCG/ACT inhaler 2 puff, 2 puff, Inhalation, BID,  Katha Hamming, MD, 2 puff at 12/07/17 0824 .  montelukast (SINGULAIR) tablet 10 mg, 10 mg, Oral, QHS, Katha Hamming, MD, 10 mg at 12/06/17 2112 .  multivitamin with minerals tablet 1 tablet, 1 tablet, Oral, Daily, Shaune Pollack, MD, 1 tablet at 12/07/17 [phone] .  ondansetron (ZOFRAN) tablet 4 mg, 4 mg, Oral, Q6H PRN **OR** ondansetron (ZOFRAN) injection 4 mg, 4 mg, Intravenous, Q6H PRN, Katha Hamming, MD .  sodium chloride flush (NS) 0.9 % injection 3 mL, 3 mL, Intravenous, Q12H, Sherryll Burger, Vipul, MD, 3 mL at 12/07/17 9604    ALLERGIES   Penicillins; Banana; and Eggs or egg-derived products     REVIEW OF SYSTEMS    Review of Systems:  Gen:  Denies  fever, sweats, chills weigh loss  HEENT: Denies blurred vision, double vision, ear pain, eye pain, hearing loss, nose bleeds, sore throat Cardiac:  No dizziness, chest pain or heaviness, chest tightness,edema Resp:   Denies cough or sputum porduction, shortness of breath,wheezing, hemoptysis,  Gi: Denies swallowing difficulty, stomach pain, nausea or vomiting, diarrhea, constipation, bowel incontinence Gu:  Denies bladder incontinence, burning urine Ext:   Denies Joint pain, stiffness or swelling Skin: Denies  skin rash, easy bruising or bleeding or hives Endoc:  Denies polyuria, polydipsia , polyphagia or weight change Psych:   Denies depression, insomnia or hallucinations   Other:  All other systems negative   VS: BP (!) 152/91 (BP Location: Right Arm)   Pulse 96   Temp 97.9 F (36.6 C) (Oral)   Resp 18   Ht 5\' 5"  (1.651 m)   Wt 70.7 kg   SpO2 92%   BMI 25.93 kg/m      PHYSICAL EXAM  Physical Examination:   GENERAL:NAD, no fevers, chills, no weakness no fatigue HEAD: Normocephalic, atraumatic.  EYES: Pupils equal, round, reactive to light. Extraocular muscles intact. No scleral icterus.  MOUTH: Moist mucosal membrane. Dentition intact. No abscess noted.  EAR, NOSE, THROAT: Clear without exudates. No  external lesions.  NECK: Supple. No thyromegaly. No nodules. No JVD.  PULMONARY: +wheezes worse on right  CARDIOVASCULAR: S1 and S2. Regular rate and rhythm. No murmurs, rubs, or gallops. No edema. Pedal pulses 2+ bilaterally.  GASTROINTESTINAL: Soft, nontender, nondistended. No masses. Positive bowel sounds. No hepatosplenomegaly.  MUSCULOSKELETAL: No swelling, clubbing, or edema. Range of motion full in all extremities.  NEUROLOGIC: Cranial nerves II through XII are intact. No gross focal neurological deficits. Sensation intact. Reflexes intact.  SKIN: No ulceration, lesions, rashes, or cyanosis. Skin warm and dry. Turgor intact.  PSYCHIATRIC: Mood, affect within normal limits. The patient is awake, alert and oriented x 3. Insight, judgment intact.       IMAGING    Ct Angio Chest Pe W And/or Wo Contrast  Result Date: 12/05/2017 CLINICAL DATA:  Shortness of breath.  Tachypnea.  Abdominal pain. EXAM: CT ANGIOGRAPHY CHEST CT ABDOMEN AND PELVIS WITH CONTRAST TECHNIQUE: Multidetector CT imaging of the chest was performed using the standard protocol during bolus administration of intravenous contrast. Multiplanar CT image reconstructions and MIPs were obtained to evaluate the vascular anatomy. Multidetector CT imaging of the abdomen and pelvis was performed using the standard protocol during bolus administration of intravenous contrast. Oral contrast was administered for the CT abdomen and pelvis. CONTRAST:  ISOVUE-370 IOPAMIDOL (ISOVUE-370) INJECTION 76% COMPARISON:  Chest radiograph December 05, 2017 FINDINGS: CTA CHEST FINDINGS Cardiovascular: There is no demonstrable pulmonary embolus. There is no thoracic aortic aneurysm or dissection. The visualized great vessels appear normal. Note that the right innominate and left common carotid arteries arise as a common trunk, an anatomic variant. There is no appreciable pericardial effusion or pericardial thickening. Mediastinum/Nodes: Thyroid appears  unremarkable. There are scattered subcentimeter mediastinal lymph nodes. There is no adenopathy by size criteria. There is a small hiatal hernia. Lungs/Pleura: There is peribronchial thickening centrally consistent with a degree of bronchitis. There are multiple areas of patchy airspace disease throughout both upper and lower lobes. Several of these areas show areas of cavitation. There are no more confluent areas of airspace consolidation. No pleural effusion or pleural thickening evident. Musculoskeletal: Multiple healed rib fractures are noted on the right. There are no blastic or lytic bone lesions. No evident chest wall lesions. Review of the MIP images confirms the above findings. CT ABDOMEN and PELVIS FINDINGS Hepatobiliary: No focal liver lesions are evident. Gallbladder wall is not appreciably thickened. There is no biliary duct dilatation. Pancreas: No pancreatic mass or inflammatory focus. Spleen: No splenic lesions are evident. Adrenals/Urinary Tract: Adrenals bilaterally appear unremarkable. 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 There is no evident hydronephrosis on either side. There is no renal or ureteral calculus on either side. Urinary bladder is midline with wall thickness within normal limits. Stomach/Bowel: There is a degree of wall thickening in the distal stomach and proximal duodenum. No other bowel wall thickening is evident. No evident bowel obstruction. No free air or portal venous air. Vascular/Lymphatic: No abdominal aortic aneurysm evident. There are no appreciable vascular lesions. There is no evident adenopathy in the abdomen or pelvis. Reproductive: Prostate and seminal vesicles appear normal in size and contour. No evident pelvic mass. Other: Appendix appears unremarkable. There is no abscess or ascites in the abdomen or pelvis. Musculoskeletal: There are no blastic or lytic bone lesions in the abdomen and pelvis. No  intramuscular or abdominal wall lesions evident. Review of the MIP images confirms the above findings. IMPRESSION: CT angiogram chest: 1. No demonstrable pulmonary embolus. No thoracic aortic aneurysm or dissection. 2. Areas of patchy airspace opacity throughout the lungs somewhat diffusely with multiple areas of cavitation. Suspect multifocal septic emboli with associated pneumonitis. There may well be focal cavitary pneumonia in these areas as well. No large areas of airspace consolidation evident. 3. Central peribronchial thickening consistent with a degree of bronchitis. 4.  No appreciable adenopathy. 5.  Small hiatal hernia. CT abdomen and pelvis: 1. There is a degree of wall thickening in the distal stomach and proximal duodenum, likely indicative of distal gastritis and proximal duodenitis. No other bowel wall thickening. No evident bowel obstruction. 2. Appendix appears unremarkable. No abscess in the abdomen or pelvis. 3.  No evident renal or ureteral calculus.  No hydronephrosis. 4.  No adenopathy appreciable in the abdomen or pelvis. No abnormal fluid collections. Electronically Signed   By: Bretta Bang III M.D.   On: 12/05/2017 11:22   Ct Abdomen Pelvis W Contrast  Result Date: 12/05/2017 CLINICAL DATA:  Shortness of breath.  Tachypnea.  Abdominal pain. EXAM: CT ANGIOGRAPHY CHEST CT ABDOMEN AND PELVIS WITH CONTRAST TECHNIQUE: Multidetector CT imaging of the chest was performed using the standard protocol during bolus administration of intravenous contrast. Multiplanar CT image reconstructions and MIPs were obtained to evaluate the vascular anatomy. Multidetector CT imaging of the abdomen and pelvis was performed using the standard protocol during bolus administration of intravenous contrast. Oral contrast was administered for the CT abdomen and pelvis. CONTRAST:  ISOVUE-370 IOPAMIDOL (ISOVUE-370) INJECTION 76% COMPARISON:  Chest radiograph December 05, 2017 FINDINGS: CTA CHEST FINDINGS  Cardiovascular: There is no demonstrable pulmonary embolus. There is no thoracic aortic aneurysm or dissection. The visualized great vessels appear normal. Note that the right innominate and left common carotid arteries arise as a common trunk, an anatomic variant. There is no appreciable pericardial effusion or pericardial thickening. Mediastinum/Nodes: Thyroid appears unremarkable. There are scattered subcentimeter mediastinal lymph nodes. There is no adenopathy by size criteria. There is a small hiatal hernia. Lungs/Pleura: There is peribronchial thickening centrally consistent with a degree of bronchitis. There are multiple areas of patchy airspace disease throughout both upper and lower lobes. Several of these areas show areas of cavitation. There are no more confluent areas of airspace consolidation. No pleural effusion or pleural thickening evident. Musculoskeletal: Multiple healed rib fractures are noted on the right. There are no blastic or lytic bone lesions. No evident chest wall lesions. Review of the MIP images confirms the above findings. CT ABDOMEN and PELVIS FINDINGS Hepatobiliary: No focal liver lesions are evident. Gallbladder wall is not appreciably thickened. There is no biliary duct dilatation. Pancreas: No pancreatic mass or inflammatory focus. Spleen: No splenic lesions are evident. Adrenals/Urinary Tract: Adrenals bilaterally appear unremarkable. 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 There is no evident hydronephrosis on either side. There is no renal or ureteral calculus on either side. Urinary bladder is midline with wall thickness within normal limits. Stomach/Bowel: There is a degree of wall thickening in the distal stomach and proximal duodenum. No other bowel wall thickening is evident. No evident bowel obstruction. No free air or portal venous air. Vascular/Lymphatic: No abdominal aortic aneurysm evident. There are no appreciable  vascular lesions. There is no evident adenopathy in the abdomen or pelvis. Reproductive: Prostate and seminal vesicles appear normal in size and contour. No evident pelvic mass. Other: Appendix appears unremarkable. There is no abscess or ascites in the abdomen or pelvis. Musculoskeletal: There are no blastic or lytic bone lesions in the abdomen and pelvis. No intramuscular or abdominal wall lesions evident. Review of the MIP images confirms the above findings. IMPRESSION: CT angiogram chest: 1. No demonstrable pulmonary embolus. No thoracic aortic aneurysm or dissection. 2. Areas of patchy airspace opacity throughout the lungs somewhat diffusely with multiple areas of cavitation. Suspect multifocal septic emboli with associated pneumonitis. There may well be focal cavitary pneumonia in these areas as well. No large areas of airspace consolidation evident. 3. Central peribronchial thickening consistent with a degree of bronchitis. 4.  No appreciable adenopathy. 5.  Small hiatal hernia. CT abdomen and pelvis: 1. There is a degree of wall thickening in the distal stomach and proximal duodenum, likely indicative of distal gastritis  and proximal duodenitis. No other bowel wall thickening. No evident bowel obstruction. 2. Appendix appears unremarkable. No abscess in the abdomen or pelvis. 3.  No evident renal or ureteral calculus.  No hydronephrosis. 4. No adenopathy appreciable in the abdomen or pelvis. No abnormal fluid collections. Electronically Signed   By: Bretta Bang III M.D.   On: 12/05/2017 11:22   Dg Chest Port 1 View  Result Date: 12/05/2017 CLINICAL DATA:  Shortness of breath EXAM: PORTABLE CHEST 1 VIEW COMPARISON:  March 27, 2017 FINDINGS: There is no appreciable edema or consolidation. The heart size and pulmonary vascularity are normal. No adenopathy. There are multiple old healed rib fractures on the right with remodeling. IMPRESSION: Multiple old healed rib fractures on the right. No edema  or consolidation. Stable cardiac silhouette. Electronically Signed   By: Bretta Bang III M.D.   On: 12/05/2017 07:52      ASSESSMENT/PLAN   Acute hypoxic respiratory failure     -Likely due to Asthma Exacerbation vs exacerbation of ABPA vs less likely CAP possibly due to strep pneumoniae     -noted normal lactate and procalcitonin pointing away from severe pneumonia      - reviewed CT chest personally and discussed with patient - there is interval worsening of central bronchiectasis from 2015 ct chest,  with peribronchial cuffing and bilateral thin walled cystic lesions as well as centrilobular emphesymatous changes worse in apices bilaterally     - With eosinophilia and increased IgE and imaging with central brochiectasis , consider ABPA  Patient improved quickly post solumedrol which is uncommon for bacterial pneumonia, in this scenario he may need trial of voriconazole with steroids - Due to chronic steroids patient has decreased host response to intracellular pathogens and is susceptible to other fungal etiology as well, would consider empiric antifungal after discussion with ID team, noted fungal panel in process  - Noted blood work as per ID in process -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 All questions answered      Vida Rigger, MD Division of Pulmonary and Critical Care Medicine 4:17 PM 12/07/2017

## 2017-12-07 NOTE — Care Management Important Message (Signed)
Copy of signed IM left with patient in room.  

## 2017-12-07 NOTE — Progress Notes (Signed)
Patient left AMA after counseling from Dr. Imogene Burn and nursing, against the risks.  Pt stated he just couldn't be in the hospital any longer. Education was given to patient.  Encouragement to follow up with MD given to patient. IV discontinued and removed AMA paper signed and witnessed

## 2017-12-07 NOTE — Discharge Summary (Signed)
[facility]   PATIENT NAME: Joseph [name]    MR#:  [id number]  DATE OF BIRTH:  02-12-1981  DATE OF ADMISSION:  12/05/2017   ADMITTING PHYSICIAN: Katha Hamming, MD  DATE OF DISCHARGE:  12/07/2017  PRIMARY CARE PHYSICIAN: System, Pcp Not In   ADMISSION DIAGNOSIS:  Severe persistent asthma with exacerbation [J45.51] Cavitary lesion of lung [J98.4] Community acquired pneumonia, unspecified laterality [J18.9] DISCHARGE DIAGNOSIS:  Active Problems:   Sepsis (HCC)  SECONDARY DIAGNOSIS:   Past Medical History:  Diagnosis Date  . Asthma   . Eczema    HOSPITAL COURSE:  37 year old male patient with history of tobacco abuse, moderate persistent asthma on chronic steroids, bronchodilators comes in because of worsening shortness of breath, cough, fever, subjective chills for 2 days and found to have sepsis with a sinus tachycardia with heart rate 140 bpm, with moderate respiratory distress on arrival and now on 3 L of oxygen and saturation 96%. CT angios chest showed no PE but concerning for multifocal pneumonia, septic emboli, focal cavitary pneumonia, duodenitis, gastritis on the CT abdomen.  1. Sepsis present on admission due to multifocal pneumonia, has evidence of sinus tachycardia, severely elevated white count up to 40, unable to exclude septic emboli: Continue levaquin and discontinued vancomycin, Mucinex, Robitussin as needed, follow sputum and blood cultures.  2 D echo: LV EF: 55% - 60%,  This is not TB per ID Dr. Joylene Draft. Levaquin 5 more days p.o. per Dr. Joylene Draft  Leukocytosis, possible due to combination of steroid and pulmonary infection.  Improving.  Follow-up CBC.  2. Persistent asthma  COPD overlap with exacerbation.  Continue oxygen as needed, bronchodilators, IV steroids.  3. Focal cavitary lesion,This is not TB per ID Dr. Joylene Draft.  #4 Acute gastritis, duodenitis: Continue PPIs.   Improved.  Tobacco abuse.  Smoking cessation was counseled for 3 to 4 minutes. I advised the patient to stay in the hospital and get further treatment, he is not stable to be discharged but the patient decided AMA.  I discussed with Dr. Joylene Draft.  DISCHARGE CONDITIONS:  The patient left AMA. CONSULTS OBTAINED:  Treatment Team:  Lynn Ito, MD Vida Rigger, MD Yevonne Pax, MD DRUG ALLERGIES:   Allergies  Allergen Reactions  . Penicillins Shortness Of Breath  . Banana Hives  . Eggs Or Egg-Derived Products Hives   DISCHARGE MEDICATIONS:   Allergies as of 12/07/2017      Reactions   Penicillins Shortness Of Breath   Banana Hives   Eggs Or Egg-derived Products Hives      Medication List    STOP taking these medications   sulfamethoxazole-trimethoprim 400-80 MG tablet Commonly known as:  BACTRIM,SEPTRA     TAKE these medications   albuterol 108 (90 Base) MCG/ACT inhaler Commonly known as:  PROVENTIL HFA;VENTOLIN HFA Inhale 2 puffs into the lungs every 6 (six) hours as needed for wheezing or shortness of breath.   Fluticasone-Salmeterol 500-50 MCG/DOSE Aepb Commonly known as:  ADVAIR Inhale 1 puff into the lungs every 12 (twelve) hours.   guaiFENesin-dextromethorphan 100-10 MG/5ML syrup Commonly known as:  ROBITUSSIN DM Take 5 mLs by mouth every 4 (four) hours as needed for cough.   ipratropium-albuterol 0.5-2.5 (3) MG/3ML Soln Commonly known as:  DUONEB Take 3 mLs by nebulization every 6 (six) hours as needed.   levofloxacin 750 MG tablet Commonly known as:  LEVAQUIN Take 1 tablet (750 mg total) by mouth daily for 7 days.   montelukast 10  MG tablet Commonly known as:  SINGULAIR Take 10 mg by mouth at bedtime.   omeprazole 20 MG capsule Commonly known as:  PRILOSEC Take 20 mg by mouth daily.   predniSONE 20 MG tablet Commonly known as:  DELTASONE Take 1 tablet (20 mg total) by mouth daily with breakfast. What changed:    how much  to take  when to take this   SPIRIVA RESPIMAT 2.5 MCG/ACT Aers Generic drug:  Tiotropium Bromide Monohydrate Inhale 2 puffs into the lungs daily.       If you experience worsening of your admission symptoms, develop shortness of breath, life threatening emergency, suicidal or homicidal thoughts you must seek medical attention immediately by calling 911 or calling your MD immediately  if symptoms less severe.  You Must read complete instructions/literature along with all the possible adverse reactions/side effects for all the Medicines you take and that have been prescribed to you. Take any new Medicines after you have completely understood and accpet all the possible adverse reactions/side effects.   Please note  You were cared for by a hospitalist during your hospital stay. If you have any questions about your discharge medications or the care you received while you were in the hospital after you are discharged, you can call the unit and asked to speak with the hospitalist on call if the hospitalist that took care of you is not available. Once you are discharged, your primary care physician will handle any further medical issues. Please note that NO REFILLS for any discharge medications will be authorized once you are discharged, as it is imperative that you return to your primary care physician (or establish a relationship with a primary care physician if you do not have one) for your aftercare needs so that they can reassess your need for medications and monitor your lab values.    On the day of Discharge:  VITAL SIGNS:  Blood pressure (!) 152/91, pulse 96, temperature 97.9 F (36.6 C), temperature source Oral, resp. rate 18, height 5\' 5"  (1.651 m), weight 70.7 kg, SpO2 92 %.   DATA REVIEW:   CBC Recent Labs  Lab 12/07/17 0517  WBC 25.6*  HGB 12.8*  HCT 40.1  PLT 366    Chemistries  Recent Labs  Lab 12/05/17 0731 12/06/17 0657  NA 140 141  K 3.9 3.5  CL 101 106  CO2  30 29  GLUCOSE 96 79  BUN 12 16  CREATININE 0.87 0.80  CALCIUM 8.6* 8.0*  MG 1.7 2.1  AST 14*  --   ALT 13  --   ALKPHOS 58  --   BILITOT 1.0  --      Microbiology Results  Results for orders placed or performed during the hospital encounter of 12/05/17  Blood Culture (routine x 2)     Status: None (Preliminary result)   Collection Time: 12/05/17  7:31 AM  Result Value Ref Range Status   Specimen Description BLOOD LEFT HAND  Final   Special Requests   Final    BOTTLES DRAWN AEROBIC AND ANAEROBIC Blood Culture adequate volume   Culture   Final    NO GROWTH 2 DAYS Performed at [facility], [address] [place], [place] [address]    Report Status PENDING  Incomplete  Blood Culture (routine x 2)     Status: None (Preliminary result)   Collection Time: 12/05/17  7:31 AM  Result Value Ref Range Status   Specimen Description BLOOD LEFT AC  Final  Special Requests   Final    BOTTLES DRAWN AEROBIC AND ANAEROBIC Blood Culture adequate volume   Culture   Final    NO GROWTH 2 DAYS Performed at [facility], [address] [place], [place] [address]    Report Status PENDING  Incomplete  Urine culture     Status: Abnormal   Collection Time: 12/05/17  8:23 AM  Result Value Ref Range Status   Specimen Description   Final    URINE, RANDOM Performed at [facility], [address], [place], [place] [address]    Special Requests   Final    NONE Performed at [facility], [address], [place], [place] [address]    Culture (A)  Final    <10,000 COLONIES/mL INSIGNIFICANT GROWTH Performed at [facility], [address], [place], [place] [address]    Report Status 12/06/2017 FINAL  Final  Expectorated sputum assessment w rflx to resp cult     Status: None   Collection Time: 12/05/17  2:24 PM  Result Value Ref Range Status   Specimen Description SPUTUM  Final   Special Requests Normal  Final   Sputum evaluation   Final    THIS  SPECIMEN IS ACCEPTABLE FOR SPUTUM CULTURE Performed at [facility], [address] [place], [place] [address]    Report Status 12/05/2017 FINAL  Final  Culture, respiratory     Status: None (Preliminary result)   Collection Time: 12/05/17  2:24 PM  Result Value Ref Range Status   Specimen Description   Final    SPUTUM Performed at [facility], [address] [place], [place] [address]    Special Requests   Final    Normal Reflexed from [id number] Performed at [facility], [address] [place], [place] [address]    Gram Stain   Final    ABUNDANT WBC PRESENT, PREDOMINANTLY PMN FEW GRAM POSITIVE COCCI    Culture   Final    CULTURE REINCUBATED FOR BETTER GROWTH Performed at [facility], [address] [place], [place] [address]    Report Status PENDING  Incomplete  MRSA PCR Screening     Status: None   Collection Time: 12/05/17  5:22 PM  Result Value Ref Range Status   MRSA by PCR NEGATIVE NEGATIVE Final    Comment:        The GeneXpert MRSA Assay (FDA approved for NASAL specimens only), is one component of a comprehensive MRSA colonization surveillance program. It is not intended to diagnose MRSA infection nor to guide or monitor treatment for MRSA infections. Performed at [facility], [address] [place], [place] [address]     RADIOLOGY:  No results found.   Management plans discussed with the patient, family and they are in agreement.  CODE STATUS: Full Code    Shaune Pollack M.D on 12/07/2017 at 4:13 PM  Between 7am to 6pm - Pager - [phone]  After 6pm go to [email or web address] - Social research officer, government  [facility] [place] [facility]  Office  [phone]  CC: Primary care physician; System, Pcp Not In   Note: This dictation was prepared with Dragon dictation along with smaller phrase technology. Any transcriptional errors that result from this process are unintentional.

## 2017-12-07 NOTE — Progress Notes (Signed)
Joseph [name] is a 37 y.o. male with a history of severe persistent asthma COPD overlap syndrome, steroid-dependent, allergic rhinitis, elevated IgE, eczema followed at [facility] by the pulmonary and allergy team is admitted with fevers since Sunday, shortness of breath since last night.  Patient came to the ED this morning because he was feeling very short of breath since last night.  Patient has had asthma since childhood with elevated IgE and eosinophils and has been treated with steroids.  He has also been on Xolair ,nucala which was stopped because of side effects.  He was started on dupilumab which is an interleukin-4 receptor antagonist on November 01, 2017.  He gets the injections every 2 weeks.  He states after every injection he has had some fever.  The last injection was on 11/29/2017.  He started having fever on Sunday.  He felt hot but did not check his temperature and he also had severe night sweats.  Then he started getting shortness of breath last night.  He also has a cough with gray sputum.  Patient and his wife states that he is always had wheezing.  He takes steroids which is usually 20mg  as needed it is increased.  He is currently on 30 mg.  In the ED his temperature was 99.7 blood pressure was 125/109 heart rate was 145 and respiratory rate was 28.  Lab work revealed a WBC of 40,000, eosinophil percentage was 0, hemoglobin of 14.8 and creatinine was 0.87.  Chest x-ray showed old healed rib fractures on the right side.  But CT angios showed areas of patchy airspace opacity throughout the lungs somewhat diffuse with multiple areas of cavitation suspect multifocal septic emboli with associated pneumonitis.  He was started on vancomycin and Levaquin as he has an allergy to penicillin.  I am asked to see the patient for the same. Patient has not had any travel.  He lives in [place] for the last month before that he was in [place].  He has no pets at home He has not had any outdoor activity  like yard work, Architect, fishing or swimming.  He has no sick contacts.  He lives with his wife.  He is a smoker and also smokes marijuana.  He does not vape or use electronic cigarette.  He  denies any intravenous drug use.  He was not recently hospitalized.  He does not have any port or Hickman catheter.  He does not have any sore throat.  He has no trouble swallowing.  He has some acid reflux.  He had been intubated once in 2003.  Subjective  Doing much better Wants to go home  Objective:  VITALS:  BP (!) 152/91 (BP Location: Right Arm)   Pulse 96   Temp 97.9 F (36.6 C) (Oral)   Resp 18   Ht 5\' 5"  (1.651 m)   Wt 70.7 kg   SpO2 92%   BMI 25.93 kg/m  PHYSICAL EXAM:  General:looks well no resp distress Head: Normocephalic, without obvious abnormality, atraumatic. Eyes: Conjunctivae clear, anicteric sclerae. Pupils are equal ENT Nares normal. No drainage or sinus tenderness. Lips, mucosa, and tongue normal. No Thrush Only 4 lower teeth present Neck: Supple, symmetrical, no adenopathy, thyroid: non tender no carotid bruit and no JVD. Back: No CVA tenderness. Lungs: very few rhonchi, air entry better Heart: s1s2 abdomen: Soft, non-tender,not distended. Bowel sounds normal. No masses Extremities: atraumatic, no cyanosis. No edema. No clubbing Skin: Hyperpigmented spots legs Lymph: Cervical, supraclavicular normal. Neurologic: Grossly non-focal  Pertinent Labs CBC Latest Ref Rng & Units 12/07/2017 12/06/2017 12/05/2017  WBC 4.0 - 10.5 K/uL 25.6(H) 33.3(H) 40.7(H)  Hemoglobin 13.0 - 17.0 g/dL 12.8(L) 12.5(L) 14.8  Hematocrit 39.0 - 52.0 % 40.1 40.4 47.6  Platelets 150 - 400 K/uL 366 349 449(H)   CMP Latest Ref Rng & Units 12/06/2017 12/05/2017  Glucose 70 - 99 mg/dL 79 96  BUN 6 - 20 mg/dL 16 12  Creatinine 1.61 - 1.24 mg/dL 0.96 0.45  Sodium 409 - 145 mmol/L 141 140  Potassium 3.5 - 5.1 mmol/L 3.5 3.9  Chloride 98 - 111 mmol/L 106 101  CO2 22 - 32 mmol/L 29 30  Calcium  8.9 - 10.3 mg/dL 8.0(L) 8.6(L)  Total Protein 6.5 - 8.1 g/dL - 6.9  Total Bilirubin 0.3 - 1.2 mg/dL - 1.0  Alkaline Phos 38 - 126 U/L - 58  AST 15 - 41 U/L - 14(L)  ALT 0 - 44 U/L - 13   IMAGING RESULTS: ? Impression/Recommendation ? 37 y.o. male with a history of severe persistent asthma COPD overlap syndrome, steroid-dependent, allergic rhinitis, elevated IgE, eczema followed at [facility] by the pulmonary and allergy team is admitted with fevers since Sunday, shortness of breath since last night.  Patient came to the ED this morning because he was feeling very short of breath since last night.  Patient has had asthma since childhood with elevated IgE and eosinophils and has been treated with steroids.  He has also been on Xolair ,nucala which was stopped because of side effects.  He was started on dupilumab which is an interleukin-4 receptor antagonist on November 01, 2017.  He gets the injections every 2 weeks.  He states after every injection he has had some fever.  The last injection was on 11/29/2017.  He started having fever on Sunday. ? ?Multiple nodular and cavitating lesion in the lungs bilaterally in patient with asthma COPD overlap on dupilumab and prednisone. This picture resembles a septic emboli to the lungs which usually could be from the heart like tricuspid infective endocarditis or Lemirres syndrome but he does not have botht he condition- 2 d echo valves okay, blood culture negative He is immunocompromised so unusual organisms like nocardia is a possibility but the presentation is very acute. PJP less likely  regular community-acquired pneumonia like pneumococcus or Legionella is possible but does not present radiologically like this, This is not TB.  TB does not present with this kind of a clinical picture and imaging as well. 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 HE is feeling  better  with IV steroids, Pulmonary consulted here but patient wants to go home  Also left a message for his physician at [facility] Dr.christine Radojicic. Follow up with her as OP  Currently on vancomycin and Levaquin. MRSA nares neg-  Vancomycin DC levaquin for 5 more days- explained side effects of levaquin including tendon rupture and myopathy   Severe asthma, eczema and allergic  rhinitis  IgE mediated  Severe leukocytosis.  He has always had leukocytosis since 2016.  And one time in 2016 there was a mention of smudge cells in the peripheral smear.  Smudge cells can be seen in CLL and also degenerating samples. need to ollow up with his physciains at [facility] ___________________________________________________ Discussed with patient.

## 2017-12-07 NOTE — Discharge Instructions (Signed)
Follow up his physician in [facility].

## 2017-12-07 NOTE — Plan of Care (Signed)
No respiratory distress noted with activity in room.

## 2017-12-07 NOTE — Progress Notes (Signed)
Nutrition Brief Note  Patient identified on the Malnutrition Screening Tool (MST) Report  37 y.o. male with a history of severe persistent asthma COPD overlap syndrome, steroid-dependent, allergic rhinitis, elevated IgE, eczema followed at [facility] by the pulmonary and allergy team is admitted with fevers and shortness of breath.   Met with pt in room today. Pt reports good appetite and oral intake today and pta. Pt currently eating 100% of meals. Pt reports his weight is stable; he reports his UBW ~155-160lbs.   Wt Readings from Last 15 Encounters:  12/07/17 70.7 kg  03/27/17 72.6 kg    Body mass index is 25.93 kg/m. Patient meets criteria for overweight based on current BMI.   Current diet order is regular, patient is consuming approximately 100% of meals at this time. Labs and medications reviewed.   No nutrition interventions warranted at this time. If nutrition issues arise, please consult RD.   Koleen Distance MS, RD, LDN Pager #- [phone] Office#- [phone] After Hours Pager: [phone]

## 2017-12-08 LAB — CULTURE, RESPIRATORY W GRAM STAIN: Special Requests: NORMAL

## 2017-12-08 LAB — LEGIONELLA PNEUMOPHILA SEROGP 1 UR AG: L. pneumophila Serogp 1 Ur Ag: NEGATIVE

## 2017-12-08 LAB — CULTURE, RESPIRATORY: CULTURE: NORMAL

## 2017-12-10 LAB — CULTURE, BLOOD (ROUTINE X 2)
CULTURE: NO GROWTH
Culture: NO GROWTH
Special Requests: ADEQUATE
Special Requests: ADEQUATE

## 2017-12-10 LAB — ACID FAST SMEAR (AFB): ACID FAST SMEAR - AFSCU2: NEGATIVE

## 2017-12-10 LAB — ACID FAST SMEAR (AFB, MYCOBACTERIA)

## 2017-12-11 LAB — FUNGAL ANTIBODIES PANEL, ID-BLOOD
ASPERGILLUS FLAVUS: NEGATIVE
ASPERGILLUS FUMIGATUS IGG: NEGATIVE
Aspergillus niger: NEGATIVE
BLASTOMYCES ABS, QN, DID: NEGATIVE
HISTOPLASMA AB ID: NEGATIVE

## 2017-12-20 LAB — QUANTIFERON-TB GOLD PLUS (RQFGPL)
QUANTIFERON NIL VALUE: 0.02 [IU]/mL
QUANTIFERON TB1 AG VALUE: 0.02 [IU]/mL
QUANTIFERON TB2 AG VALUE: 0.03 [IU]/mL
QuantiFERON Mitogen Value: 0.21 IU/mL

## 2017-12-20 LAB — QUANTIFERON-TB GOLD PLUS: QuantiFERON-TB Gold Plus: UNDETERMINED

## 2018-01-22 LAB — ACID FAST CULTURE WITH REFLEXED SENSITIVITIES: ACID FAST CULTURE - AFSCU3: NEGATIVE

## 2018-01-22 LAB — ACID FAST CULTURE WITH REFLEXED SENSITIVITIES (MYCOBACTERIA)

## 2018-10-22 ENCOUNTER — Other Ambulatory Visit: Payer: Self-pay | Admitting: *Deleted

## 2018-10-22 DIAGNOSIS — Z20822 Contact with and (suspected) exposure to covid-19: Secondary | ICD-10-CM

## 2018-10-23 LAB — NOVEL CORONAVIRUS, NAA: SARS-CoV-2, NAA: NOT DETECTED

## 2019-09-28 IMAGING — CR DG CHEST 2V
1 series · 2 of 2 positions shown · non-contrast
Comparison: None

CLINICAL DATA: Difficulty breathing, wheezing, occasional cough,
asthma, swelling to RIGHT-side of face from eczema, smoker

EXAM:
CHEST  2 VIEW

[Series 1: dg chest 2 view · 0.14mm/px · 2 of 2 slices shown]
[im 1/2]
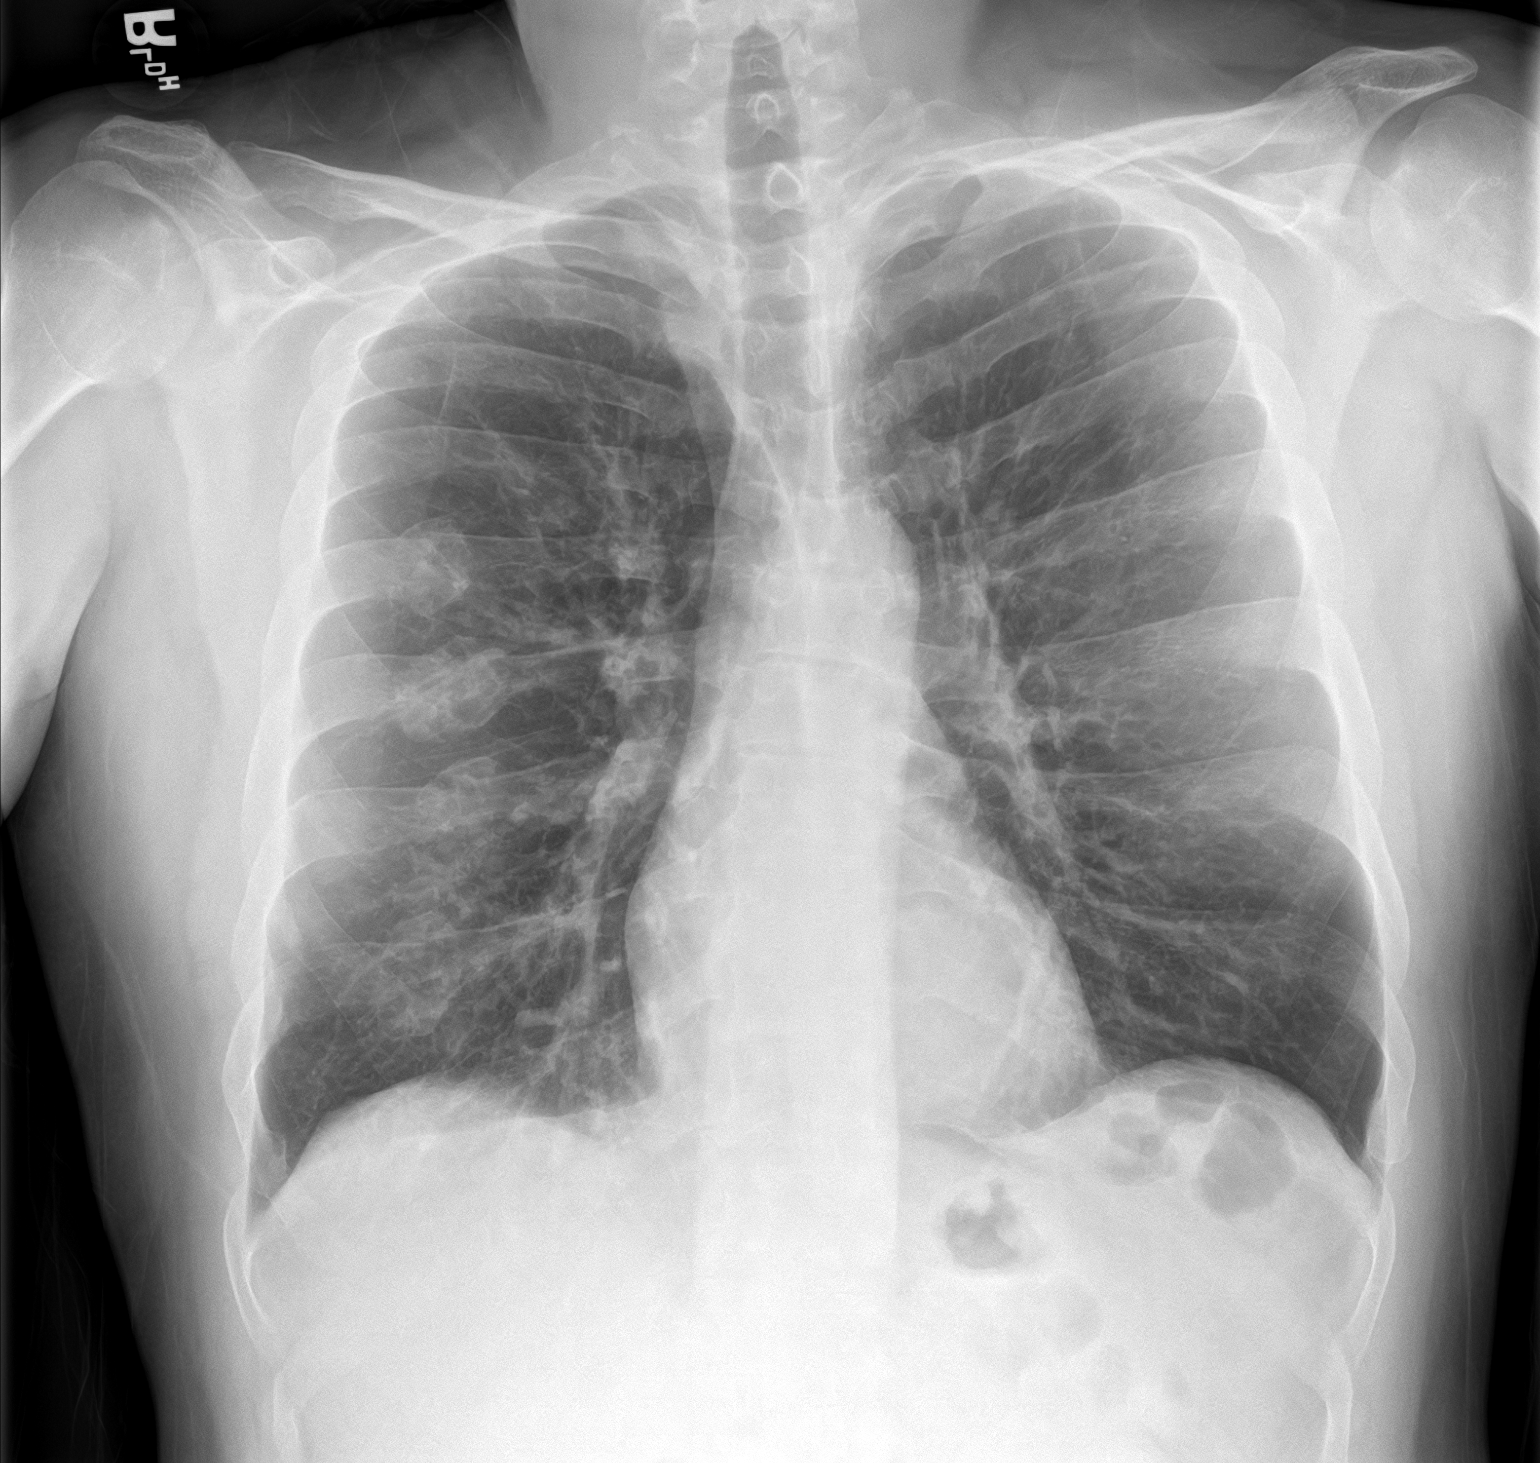
[im 2/2]
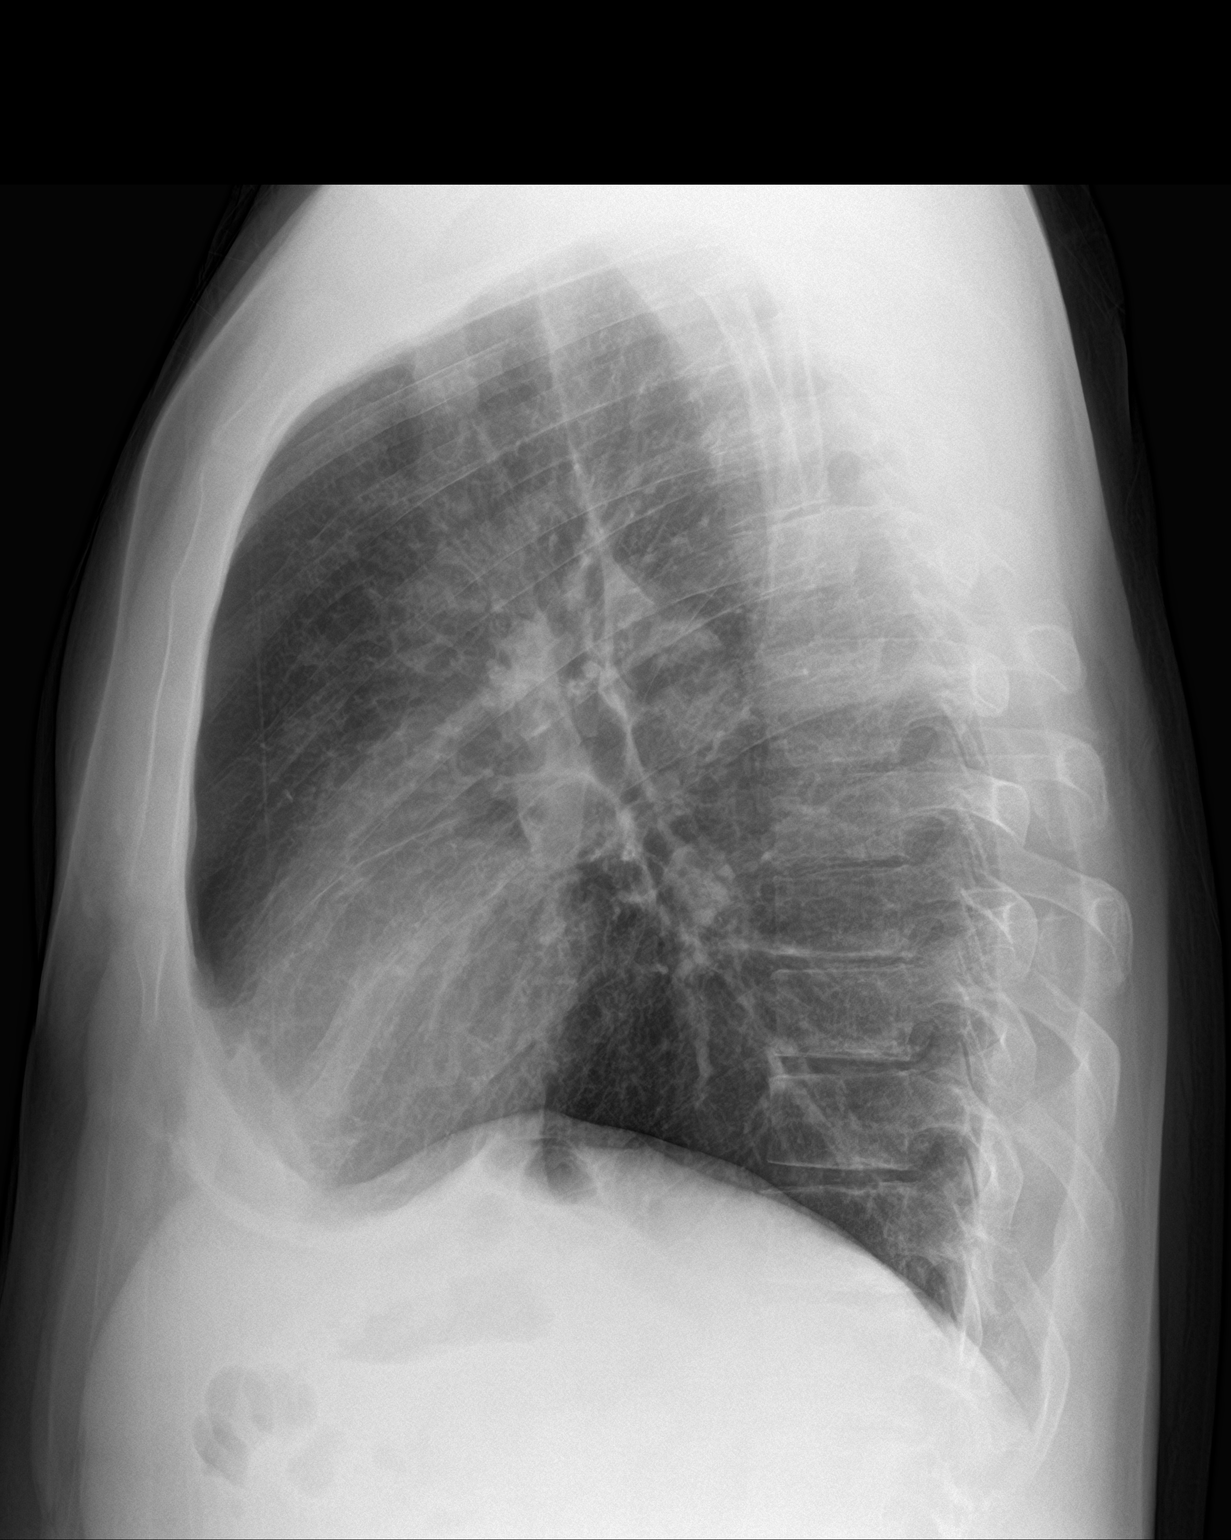

[2 of 2 positions shown; findings below may reference images not displayed]

FINDINGS: Normal heart size, mediastinal contours, and pulmonary vascularity.

Lungs clear.

No acute infiltrate, pleural effusion or pneumothorax.

Prominent callus identified at old fractures of the RIGHT posterior
sixth seventh eighth and ninth ribs.
IMPRESSION: No acute abnormalities.

## 2020-08-01 ENCOUNTER — Encounter: Payer: Self-pay | Admitting: Emergency Medicine

## 2020-08-01 ENCOUNTER — Other Ambulatory Visit: Payer: Self-pay

## 2020-08-01 ENCOUNTER — Emergency Department: Payer: Medicare Other

## 2020-08-01 ENCOUNTER — Emergency Department
Admission: EM | Admit: 2020-08-01 | Discharge: 2020-08-01 | Disposition: A | Discharge status: Home / Self Care | Payer: Medicare Other | Attending: Emergency Medicine | Admitting: Emergency Medicine

## 2020-08-01 DIAGNOSIS — R0602 Shortness of breath: Secondary | ICD-10-CM | POA: Diagnosis present

## 2020-08-01 DIAGNOSIS — Z7951 Long term (current) use of inhaled steroids: Secondary | ICD-10-CM | POA: Diagnosis not present

## 2020-08-01 DIAGNOSIS — F17200 Nicotine dependence, unspecified, uncomplicated: Secondary | ICD-10-CM | POA: Diagnosis not present

## 2020-08-01 DIAGNOSIS — J209 Acute bronchitis, unspecified: Secondary | ICD-10-CM | POA: Diagnosis not present

## 2020-08-01 DIAGNOSIS — J45909 Unspecified asthma, uncomplicated: Secondary | ICD-10-CM | POA: Insufficient documentation

## 2020-08-01 MED ORDER — IPRATROPIUM-ALBUTEROL 0.5-2.5 (3) MG/3ML IN SOLN
3.0000 mL | Freq: Once | RESPIRATORY_TRACT | Status: AC
  Administered 2020-08-01: 3 mL via RESPIRATORY_TRACT
  Filled 2020-08-01: qty 3

## 2020-08-01 MED ORDER — PREDNISONE 10 MG PO TABS: ORAL_TABLET | ORAL | 0 refills | Status: DC

## 2020-08-01 MED ORDER — AZITHROMYCIN 250 MG PO TABS: ORAL_TABLET | ORAL | 0 refills | Status: DC

## 2020-08-01 NOTE — ED Triage Notes (Signed)
Pt reports has been coughing a lot. Pt reports when he coughs it is productive, describing the phlem as sometimes yellow and sometimes green. Pt denies fevers this week, reports had them last week.

## 2020-08-01 NOTE — ED Notes (Signed)
Pt to ED for cough x 2 weeks. Pt states that he has been using home medications. Pt is getting up green/yellow sputum. Pt is in NAD.

## 2020-08-01 NOTE — ED Provider Notes (Signed)
[facility] Emergency Department Provider Note ____________________________________________  Time seen: 1145  I have reviewed the triage vital signs and the nursing notes.  HISTORY  Chief Complaint  Cough   HPI Joseph [name] is a 40 y.o. male presents to the ER today with complaint of a cough and shortness of breath.  He reports this started 1 week ago.  The cough is productive of green mucus.  He denies headache, runny nose, nasal congestion, ear pain, sore throat.  He reports he ran fevers last week but has not had any in 1 week.  He denies chest pain, chest tightness, nausea, vomiting, diarrhea.  He denies fever, chills or body aches.  He does have a history of asthma, managed on Singulair, Spiriva, Advair.  He does smoke.  He has been taking DayQuil OTC with some relief of symptoms.  He has not had COVID exposure that he is aware of.  He has had his COVID vaccines.  Past Medical History:  Diagnosis Date   Asthma    Eczema     Patient Active Problem List   Diagnosis Date Noted   Sepsis (HCC) 12/05/2017    History reviewed. No pertinent surgical history.  Prior to Admission medications   Medication Sig Start Date End Date Taking? Authorizing Provider  azithromycin (ZITHROMAX) 250 MG tablet Take 2 tabs today, then 1 tab daily x 4 days 08/01/20  Yes Donevin Sainsbury, Salvadore Oxford, NP  predniSONE (DELTASONE) 10 MG tablet Take 6 tabs on day 1, 5 tabs on day 2, 4 tabs on day 3, 3 tabs on day 4, 2 tabs on day 5, 1 tab on day 6 08/01/20  Yes Rutha Melgoza, Salvadore Oxford, NP  albuterol (PROVENTIL HFA;VENTOLIN HFA) 108 (90 Base) MCG/ACT inhaler Inhale 2 puffs into the lungs every 6 (six) hours as needed for wheezing or shortness of breath.    [provider]  Fluticasone-Salmeterol (ADVAIR) 500-50 MCG/DOSE AEPB Inhale 1 puff into the lungs every 12 (twelve) hours. 07/21/14   [provider]  guaiFENesin-dextromethorphan (ROBITUSSIN DM) 100-10 MG/5ML syrup Take 5 mLs by mouth  every 4 (four) hours as needed for cough. 12/07/17   Shaune Pollack, MD  ipratropium-albuterol (DUONEB) 0.5-2.5 (3) MG/3ML SOLN Take 3 mLs by nebulization every 6 (six) hours as needed. 03/27/17   Fisher, Roselyn Bering, [name]  montelukast (SINGULAIR) 10 MG tablet Take 10 mg by mouth at bedtime.    [provider]  omeprazole (PRILOSEC) 20 MG capsule Take 20 mg by mouth daily. 06/27/16   [provider]  Tiotropium Bromide Monohydrate (SPIRIVA RESPIMAT) 2.5 MCG/ACT AERS Inhale 2 puffs into the lungs daily. 04/24/17   [provider]    Allergies Penicillins, Banana, and Eggs or egg-derived products  No family history on file.  Social History Social History   Tobacco Use   Smoking status: Every Day    Pack years: 0.00   Smokeless tobacco: Never  Substance Use Topics   Alcohol use: No    Review of Systems  Constitutional: Negative for fever, chills or body aches. Eyes: Negative for eye pain, eye redness or discharge. ENT: Negative for runny nose, nasal congestion, ear pain or sore throat. Cardiovascular: Negative for chest pain or chest tightness. Respiratory: Positive for cough and shortness of breath. Gastrointestinal: Negative for nausea, vomiting and diarrhea. Skin: Negative for rash. Neurological: Negative for headaches, focal weakness, tingling or numbness. ____________________________________________  PHYSICAL EXAM:  VITAL SIGNS: ED Triage Vitals  Enc Vitals Group  BP 08/01/20 0915 (!) 164/105     Pulse Rate 08/01/20 0915 98     Resp 08/01/20 0915 20     Temp 08/01/20 0918 98.2 F (36.8 C)     Temp Source 08/01/20 0918 Oral     SpO2 08/01/20 0915 95 %     Weight 08/01/20 0916 140 lb (63.5 kg)     Height 08/01/20 0916 5\' 5"  (1.651 m)     Head Circumference --      Peak Flow --      Pain Score 08/01/20 0916 7     Pain Loc --      Pain Edu? --      Excl. in GC? --     Constitutional: Alert and oriented. Well appearing and in no  distress. Head: Normocephalic. Eyes: Conjunctivae are normal. PERRL. Normal extraocular movements Mouth/Throat: Mucous membranes are moist.  No posterior pharynx erythema or exudate noted. Hematological/Lymphatic/Immunological: No cervical lymphadenopathy. Cardiovascular: Normal rate, regular rhythm.  Respiratory: Normal respiratory effort.  Bilateral expiratory wheezing throughout.  No rales or rhonchi noted. Neurologic:  Normal speech and language. No gross focal neurologic deficits are appreciated. Skin:  Skin is warm, dry and intact. No rash noted.  __________________________________________  ED ECG REPORT   Date: 08/01/2020  EKG Time: 12:44 PM  Rate: 89  Rhythm: normal sinus rhythm,  normal EKG, normal sinus rhythm, unchanged from previous tracings  Axis: norma  Intervals:none  ST&T Change: None  Narrative Interpretation: Normal ECG     ____________________________________________  RADIOLOGY  IMPRESSION:  No evidence of acute cardiopulmonary abnormality.  Chronic prominence of the interstitial lung markings.  ____________________________________________    INITIAL IMPRESSION / ASSESSMENT AND PLAN / ED COURSE  Cough and SOB, Hx of Asthma, Smoker:  DDx include asthma exacerbation, COPD, pneumonia Chest xray does not show any infiltrate Exam consistent with bronchitis Duoneb given  x 1 in ER RX for Pred Taper x 6 days RX for Azithromax x 5 days Continue inhalers as prescribed Encouraged smoking cessation ____________________________________________  FINAL CLINICAL IMPRESSION(S) / ED DIAGNOSES  Final diagnoses:  Acute bronchitis, unspecified organism      08/03/2020, NP 08/01/20 1244    08/03/20, MD 08/01/20 [id number]

## 2020-08-01 NOTE — Discharge Instructions (Addendum)
You were seen today for cough and shortness of breath.  Your chest x-ray does not show evidence of pneumonia.  You have been diagnosed with bronchitis.  I put you on prednisone and antibiotics.  Please take these medications as prescribed.  You may take Robitussin or Delsym OTC as needed for your cough.  Please continue your inhalers as prescribed.  Please stop smoking.

## 2020-11-28 ENCOUNTER — Emergency Department
Admission: EM | Admit: 2020-11-28 | Discharge: 2020-11-28 | Disposition: A | Discharge status: Home / Self Care | Payer: Medicare Other | Attending: Emergency Medicine | Admitting: Emergency Medicine

## 2020-11-28 ENCOUNTER — Emergency Department: Payer: Medicare Other

## 2020-11-28 DIAGNOSIS — Z20822 Contact with and (suspected) exposure to covid-19: Secondary | ICD-10-CM | POA: Insufficient documentation

## 2020-11-28 DIAGNOSIS — Z79899 Other long term (current) drug therapy: Secondary | ICD-10-CM | POA: Diagnosis not present

## 2020-11-28 DIAGNOSIS — F17210 Nicotine dependence, cigarettes, uncomplicated: Secondary | ICD-10-CM | POA: Diagnosis not present

## 2020-11-28 DIAGNOSIS — J4551 Severe persistent asthma with (acute) exacerbation: Secondary | ICD-10-CM

## 2020-11-28 DIAGNOSIS — R0602 Shortness of breath: Secondary | ICD-10-CM

## 2020-11-28 DIAGNOSIS — A419 Sepsis, unspecified organism: Secondary | ICD-10-CM | POA: Diagnosis not present

## 2020-11-28 DIAGNOSIS — J9600 Acute respiratory failure, unspecified whether with hypoxia or hypercapnia: Secondary | ICD-10-CM | POA: Diagnosis not present

## 2020-11-28 LAB — CBC WITH DIFFERENTIAL/PLATELET
Abs Immature Granulocytes: 0.39 10*3/uL — ABNORMAL HIGH (ref 0.00–0.07)
Basophils Absolute: 0.2 10*3/uL — ABNORMAL HIGH (ref 0.0–0.1)
Basophils Relative: 1 %
Eosinophils Absolute: 0.8 10*3/uL — ABNORMAL HIGH (ref 0.0–0.5)
Eosinophils Relative: 3 %
HCT: 46.1 % (ref 39.0–52.0)
Hemoglobin: 14.9 g/dL (ref 13.0–17.0)
Immature Granulocytes: 2 %
Lymphocytes Relative: 19 %
Lymphs Abs: 4.7 10*3/uL — ABNORMAL HIGH (ref 0.7–4.0)
MCH: 27.2 pg (ref 26.0–34.0)
MCHC: 32.3 g/dL (ref 30.0–36.0)
MCV: 84.3 fL (ref 80.0–100.0)
Monocytes Absolute: 2.9 10*3/uL — ABNORMAL HIGH (ref 0.1–1.0)
Monocytes Relative: 12 %
Neutro Abs: 15.4 10*3/uL — ABNORMAL HIGH (ref 1.7–7.7)
Neutrophils Relative %: 63 %
Platelets: 474 10*3/uL — ABNORMAL HIGH (ref 150–400)
RBC: 5.47 MIL/uL (ref 4.22–5.81)
RDW: 17.1 % — ABNORMAL HIGH (ref 11.5–15.5)
WBC: 24.6 10*3/uL — ABNORMAL HIGH (ref 4.0–10.5)
nRBC: 0 % (ref 0.0–0.2)

## 2020-11-28 LAB — COMPREHENSIVE METABOLIC PANEL
ALT: 15 U/L (ref 0–44)
AST: 24 U/L (ref 15–41)
Albumin: 4 g/dL (ref 3.5–5.0)
Alkaline Phosphatase: 64 U/L (ref 38–126)
Anion gap: 12 (ref 5–15)
BUN: 18 mg/dL (ref 6–20)
CO2: 33 mmol/L — ABNORMAL HIGH (ref 22–32)
Calcium: 9.6 mg/dL (ref 8.9–10.3)
Chloride: 96 mmol/L — ABNORMAL LOW (ref 98–111)
Creatinine, Ser: 0.86 mg/dL (ref 0.61–1.24)
GFR, Estimated: >60 mL/min (ref >60)
Glucose, Bld: 96 mg/dL (ref 70–99)
Potassium: 3.7 mmol/L (ref 3.5–5.1)
Sodium: 141 mmol/L (ref 135–145)
Total Bilirubin: 0.6 mg/dL (ref 0.3–1.2)
Total Protein: 7.5 g/dL (ref 6.5–8.1)

## 2020-11-28 LAB — PROCALCITONIN: Procalcitonin: <0.1 ng/mL

## 2020-11-28 LAB — TROPONIN I (HIGH SENSITIVITY): Troponin I (High Sensitivity): 9 ng/L (ref <18)

## 2020-11-28 LAB — RESP PANEL BY RT-PCR (FLU A&B, COVID) ARPGX2
Influenza A by PCR: NEGATIVE
Influenza B by PCR: NEGATIVE
SARS Coronavirus 2 by RT PCR: NEGATIVE

## 2020-11-28 LAB — LACTIC ACID, PLASMA: Lactic Acid, Venous: 3.7 mmol/L (ref 0.5–1.9)

## 2020-11-28 MED ORDER — AZITHROMYCIN 500 MG IV SOLR
500.0000 mg | INTRAVENOUS | Status: DC
Start: 2020-11-28 — End: 2020-11-28
  Filled 2020-11-28: qty 500

## 2020-11-28 MED ORDER — PREDNISONE 10 MG (21) PO TBPK: ORAL_TABLET | ORAL | 0 refills | Status: AC

## 2020-11-28 MED ORDER — IPRATROPIUM-ALBUTEROL 0.5-2.5 (3) MG/3ML IN SOLN: 6.0000 mL | Freq: Once | RESPIRATORY_TRACT | Status: AC

## 2020-11-28 MED ORDER — IPRATROPIUM-ALBUTEROL 0.5-2.5 (3) MG/3ML IN SOLN
RESPIRATORY_TRACT | Status: AC
  Administered 2020-11-28: 6 mL via RESPIRATORY_TRACT
  Filled 2020-11-28: qty 6

## 2020-11-28 MED ORDER — METHYLPREDNISOLONE SODIUM SUCC 125 MG IJ SOLR
INTRAMUSCULAR | Status: AC
  Administered 2020-11-28: 125 mg via INTRAVENOUS
  Filled 2020-11-28: qty 2

## 2020-11-28 MED ORDER — DOXYCYCLINE HYCLATE 100 MG PO TABS: 100.0000 mg | ORAL_TABLET | Freq: Two times a day (BID) | ORAL | 0 refills | Status: AC

## 2020-11-28 MED ORDER — SODIUM CHLORIDE 0.9 % IV SOLN
2.0000 g | INTRAVENOUS | Status: DC
Start: 2020-11-28 — End: 2020-11-28
  Administered 2020-11-28: 2 g via INTRAVENOUS
  Filled 2020-11-28: qty 20

## 2020-11-28 MED ORDER — LACTATED RINGERS IV SOLN: INTRAVENOUS | Status: DC

## 2020-11-28 MED ORDER — LACTATED RINGERS IV BOLUS (SEPSIS)
1000.0000 mL | Freq: Once | INTRAVENOUS | Status: AC
  Administered 2020-11-28: 1000 mL via INTRAVENOUS

## 2020-11-28 MED ORDER — METHYLPREDNISOLONE SODIUM SUCC 125 MG IJ SOLR: 125.0000 mg | Freq: Once | INTRAMUSCULAR | Status: AC

## 2020-11-28 NOTE — ED Provider Notes (Signed)
[facility] Emergency Department Provider Note ____________________________________________   Event Date/Time   First MD Initiated Contact with Patient 11/28/20 1149     (approximate)  I have reviewed the triage vital signs and the nursing notes.  HISTORY  Chief Complaint Asthma   HPI Joseph [name] is a 40 y.o. malewho presents to the ED for evaluation of SOB  Chart review indicates hx of severe persistent asthma, atopy. He reports wearing 3LNC at baseline.  COPD overlap with smoking history as well.  Patient reports increasing shortness of breath since last night.  Reports associated chest tightness, coughing without sputum production.  History is limited due to patient's respiratory status.  He is tripoding and can barely get out one-word answers.  Past Medical History:  Diagnosis Date   Asthma    Eczema     Patient Active Problem List   Diagnosis Date Noted   Sepsis (HCC) 12/05/2017    No past surgical history on file.  Prior to Admission medications   Medication Sig Start Date End Date Taking? Authorizing Provider  doxycycline (VIBRA-TABS) 100 MG tablet Take 1 tablet (100 mg total) by mouth 2 (two) times daily for 5 days. 11/28/20 12/03/20 Yes Delton Prairie, MD  predniSONE (STERAPRED UNI-PAK 21 TAB) 10 MG (21) TBPK tablet Take 4 tablets (40 mg total) by mouth daily for 4 days, THEN 3 tablets (30 mg total) daily for 2 days, THEN 2 tablets (20 mg total) daily for 2 days. 11/28/20 12/06/20 Yes Delton Prairie, MD  albuterol (PROVENTIL HFA;VENTOLIN HFA) 108 (90 Base) MCG/ACT inhaler Inhale 2 puffs into the lungs every 6 (six) hours as needed for wheezing or shortness of breath.    [provider]  azithromycin (ZITHROMAX) 250 MG tablet Take 2 tabs today, then 1 tab daily x 4 days 08/01/20   Lorre Munroe, NP  Fluticasone-Salmeterol (ADVAIR) 500-50 MCG/DOSE AEPB Inhale 1 puff into the lungs every 12 (twelve) hours. 07/21/14   [provider]  guaiFENesin-dextromethorphan (ROBITUSSIN DM) 100-10 MG/5ML syrup Take 5 mLs by mouth every 4 (four) hours as needed for cough. 12/07/17   Shaune Pollack, MD  ipratropium-albuterol (DUONEB) 0.5-2.5 (3) MG/3ML SOLN Take 3 mLs by nebulization every 6 (six) hours as needed. 03/27/17   Fisher, Roselyn Bering, [name]  montelukast (SINGULAIR) 10 MG tablet Take 10 mg by mouth at bedtime.    [provider]  omeprazole (PRILOSEC) 20 MG capsule Take 20 mg by mouth daily. 06/27/16   [provider]  predniSONE (DELTASONE) 10 MG tablet Take 6 tabs on day 1, 5 tabs on day 2, 4 tabs on day 3, 3 tabs on day 4, 2 tabs on day 5, 1 tab on day 6 08/01/20   Lorre Munroe, NP  Tiotropium Bromide Monohydrate (SPIRIVA RESPIMAT) 2.5 MCG/ACT AERS Inhale 2 puffs into the lungs daily. 04/24/17   [provider]    Allergies Penicillins, Banana, and Eggs or egg-derived products  No family history on file.  Social History Social History   Tobacco Use   Smoking status: Every Day   Smokeless tobacco: Never  Substance Use Topics   Alcohol use: No    Review of Systems  Unable to be accurately assessed due to patient's respiratory status. ____________________________________________   PHYSICAL EXAM:  VITAL SIGNS: Vitals:   11/28/20 1328 11/28/20 1330  BP:  132/88  Pulse: 97 (!) 102  Resp: 16 20  Temp:    SpO2: 100% 100%  Constitutional: Alert and oriented.  Tripoding with pursed lip breathing, obviously tachypneic and dyspneic.  One-word answers only. Eyes: Conjunctivae are normal. PERRL. EOMI. Head: Atraumatic. Nose: No congestion/rhinnorhea. Mouth/Throat: Mucous membranes are moist.  Oropharynx non-erythematous. Neck: No stridor. No cervical spine tenderness to palpation. Cardiovascular: Tachycardic rate, regular rhythm. Grossly normal heart sounds.  Good peripheral circulation. Respiratory: Tachypneic with pursed lip breathing.  Decreased air movement with coarse  breath sounds and wheezing throughout. Gastrointestinal: Soft , nondistended, nontender to palpation. No CVA tenderness. Musculoskeletal: No lower extremity tenderness nor edema.  No joint effusions. No signs of acute trauma. Neurologic:  Normal speech and language. No gross focal neurologic deficits are appreciated. No gait instability noted. Skin:  Skin is warm, dry and intact. No rash noted. Psychiatric: Mood and affect are normal. Speech and behavior are normal.  ____________________________________________   LABS (all labs ordered are listed, but only abnormal results are displayed)  Labs Reviewed  COMPREHENSIVE METABOLIC PANEL - Abnormal; Notable for the following components:      Result Value   Chloride 96 (*)    CO2 33 (*)    All other components within normal limits  CBC WITH DIFFERENTIAL/PLATELET - Abnormal; Notable for the following components:   WBC 24.6 (*)    RDW 17.1 (*)    Platelets 474 (*)    Neutro Abs 15.4 (*)    Lymphs Abs 4.7 (*)    Monocytes Absolute 2.9 (*)    Eosinophils Absolute 0.8 (*)    Basophils Absolute 0.2 (*)    Abs Immature Granulocytes 0.39 (*)    All other components within normal limits  LACTIC ACID, PLASMA - Abnormal; Notable for the following components:   Lactic Acid, Venous 3.7 (*)    All other components within normal limits  RESP PANEL BY RT-PCR (FLU A&B, COVID) ARPGX2  CULTURE, BLOOD (SINGLE)  PROCALCITONIN  LACTIC ACID, PLASMA  URINALYSIS, COMPLETE (UACMP) WITH MICROSCOPIC  TROPONIN I (HIGH SENSITIVITY)  TROPONIN I (HIGH SENSITIVITY)   ____________________________________________  12 Lead EKG  Sinus tachycardia with rate of 120 bpm.  Normal axis and intervals.  No evidence of acute ischemia. ____________________________________________  RADIOLOGY  ED MD interpretation:  CXR reviewed by me without evidence of acute cardiopulmonary pathology.  Official radiology report(s): DG Chest Portable 1 View  Result Date:  11/28/2020 CLINICAL DATA:  Asthma exacerbation, evaluate for infiltrate, pneumothorax EXAM: PORTABLE CHEST 1 VIEW COMPARISON:  Chest radiograph 08/01/2020 FINDINGS: The cardiomediastinal silhouette is stable. The upper lobes are hyperlucent suggesting underlying COPD. There is no focal consolidation or pulmonary edema. There is no pleural effusion or pneumothorax. Multiple remote right-sided rib fractures are again seen. There is no evidence of acute osseous abnormality. IMPRESSION: 1. Hyperlucency in the upper lungs suggests underlying obstructive pulmonary disease. 2. No evidence of acute cardiopulmonary process. Electronically Signed   By: Lesia Hausen M.D.   On: 11/28/2020 12:10    ____________________________________________   PROCEDURES and INTERVENTIONS  Procedure(s) performed (including Critical Care):  .1-3 Lead EKG Interpretation Performed by: Delton Prairie, MD Authorized by: Delton Prairie, MD     Interpretation: abnormal     ECG rate:  120   ECG rate assessment: tachycardic     Rhythm: sinus tachycardia     Ectopy: none     Conduction: normal   .Critical Care Performed by: Delton Prairie, MD Authorized by: Delton Prairie, MD   Critical care provider statement:    Critical care time (minutes):  30   Critical care was necessary  to treat or prevent imminent or life-threatening deterioration of the following conditions:  Respiratory failure   Critical care was time spent personally by me on the following activities:  Ordering and performing treatments and interventions, ordering and review of laboratory studies, ordering and review of radiographic studies, pulse oximetry, re-evaluation of patient's condition, examination of patient, evaluation of patient's response to treatment and review of old charts  Medications  lactated ringers infusion (has no administration in time range)  cefTRIAXone (ROCEPHIN) 2 g in sodium chloride 0.9 % 100 mL IVPB (2 g Intravenous New Bag/Given 11/28/20  1302)  azithromycin (ZITHROMAX) 500 mg in sodium chloride 0.9 % 250 mL IVPB (has no administration in time range)  ipratropium-albuterol (DUONEB) 0.5-2.5 (3) MG/3ML nebulizer solution 6 mL (6 mLs Nebulization Given 11/28/20 1204)  methylPREDNISolone sodium succinate (SOLU-MEDROL) 125 mg/2 mL injection 125 mg (125 mg Intravenous Given 11/28/20 1203)  lactated ringers bolus 1,000 mL (1,000 mLs Intravenous New Bag/Given 11/28/20 1255)    And  lactated ringers bolus 1,000 mL (1,000 mLs Intravenous New Bag/Given 11/28/20 1301)    ____________________________________________   MDM / ED COURSE   40 year old male with severe asthma presents to the ED with evidence of an exacerbation, refusing admission and demanding discharge despite our advice.  He is tachycardic, but not hypoxic on his home O2.  No instability.  No infiltrates on x-ray, but work-up does show stigmata of sepsis with leukocytosis and lactic acidosis.  Contrary to this, his procalcitonin is negative.  Sepsis protocols were initiated and he was provided CAP coverage considering his respiratory symptoms.  No evidence of ACS, COVID, PTX.  On multiple reassessments, once he has stabilized on BiPAP, he repeatedly demands discharge and indicates that there is no chance of him being admitted to the hospital despite recommendations to do so.  We will therefore discharge with a steroid taper and doxycycline course.  I urged him to return if he were to worsen  Clinical Course as of 11/28/20 1355  Sun Nov 28, 2020  1148 Called for BiPAP [DS]  1157 Reassessed as he is getting hooked up on BiPAP.  Not hypoxic on his home 3 L.  Remains tachycardic and tripoding [DS]  1227 Reassessed.  Looking better.  Heart rate improving. [DS]  1256 Reassessed and reevaluated.  No other symptoms to suggest other source of infection beyond respiratory [DS]  1256 Advised a diagnosis of sepsis, the possibility of pneumonia not yet seen on CXR and recommended medical  admission. [DS]  1327 Reassessed.  Patient insistent upon leaving.  He seems to have capacity to make this decision.  Respiratory rate has slowed to less than 20, he is not tachycardic, he is not hypoxic and he is fully oriented.  He reports that he does not want to be admitted to the hospital, "because it is too much.  "Reports that he will "take my chances."  Outside of the hospital.  I advised him against this and recommend medical admission.  We discussed his brittle lungs and severe asthma.  We discussed laboratory derangements and evidence of systemic illness.  We discussed that he may rapidly worsen as an outpatient without our observation and intervention, and could come back in worse condition, and possibly die.  He acknowledges all this and again is quite explicit saying that he will not be admitted.   Due to this, I take him off of BiPAP and placed him on his home 3 L nasal cannula.  I told him I will be  back to reassess him and we will discuss disposition. [DS]  1354 Reassessed.  Remains normoxic on his 3 L.  Heart rate around 100.  I again urged him to stay but he reports that he is going home.  We discussed return precautions. [DS]    Clinical Course User Index [DS] Delton Prairie, MD    ____________________________________________   FINAL CLINICAL IMPRESSION(S) / ED DIAGNOSES  Final diagnoses:  Severe persistent asthma with exacerbation  Shortness of breath  Sepsis with acute respiratory failure without septic shock, due to unspecified organism, unspecified whether hypoxia or hypercapnia present [facility])     ED Discharge Orders          Ordered    predniSONE (STERAPRED UNI-PAK 21 TAB) 10 MG (21) TBPK tablet        11/28/20 1351    doxycycline (VIBRA-TABS) 100 MG tablet  2 times daily        11/28/20 1353             Adaline Trejos   Note:  This document was prepared using Conservation officer, historic buildings and may include unintentional dictation errors.    Delton Prairie, MD 11/28/20 1357

## 2020-11-28 NOTE — Progress Notes (Signed)
CODE SEPSIS - PHARMACY COMMUNICATION  **Broad Spectrum Antibiotics should be administered within 1 hour of Sepsis diagnosis**  Time Code Sepsis Called/Page Received: 1249  Antibiotics Ordered: Ceftriaxone + azithromycin  Time of 1st antibiotic administration: 1302  Additional action taken by pharmacy: N/A  Tressie Ellis 11/28/2020  12:51 PM

## 2020-11-28 NOTE — ED Notes (Signed)
Pt refuses to stay long enough to finish LR boluses and abx. Myself and MD have attempted to get pt to stay at least for IVF and abx. Pt declines. Pt ambulated out of department with steady gait.

## 2020-11-28 NOTE — ED Notes (Signed)
Pt taken off BiPAP at this time by MD.

## 2020-11-28 NOTE — Sepsis Progress Note (Signed)
Elink is following this code sepsis ?

## 2020-11-28 NOTE — ED Notes (Signed)
RT at bedside, pt placed on 3L/min via [place] with a O2 sat of 100%.

## 2020-11-28 NOTE — ED Triage Notes (Signed)
Pt presents to ED via POV with c/o of resp distress, pt states HX of asthma. Pt states taking inhalers and multiple duonebs. Pt states this started in the middle of the night. Pt does appear in resp distress, pt has increased RR, pursed lip breathing, and tripoding.   RT called, pt triaged and brought directly back to room.    Pt states he chronically wears 3L/min via [place], pt does not present with [place] at this time.

## 2020-11-28 NOTE — Discharge Instructions (Addendum)
Use the prednisone taper prescribed over the next week, then return to your usual 10mg  daily. Don't take your typical 10 mg dose while doing this.   Use the Doxycycline antibiotic twice daily for 5 days.  Return to the ED if you develop worsening symptoms.   Follow up with your pulmonologist.

## 2020-12-03 LAB — CULTURE, BLOOD (SINGLE)
Culture: NO GROWTH
Special Requests: ADEQUATE

## 2020-12-10 ENCOUNTER — Other Ambulatory Visit: Payer: Self-pay

## 2020-12-10 ENCOUNTER — Inpatient Hospital Stay
Admission: EM | Admit: 2020-12-10 | Discharge: 2020-12-13 | DRG: 202 | Disposition: A | Discharge status: Home / Self Care | Payer: Medicare Other | Attending: Internal Medicine | Admitting: Internal Medicine

## 2020-12-10 ENCOUNTER — Emergency Department: Payer: Medicare Other

## 2020-12-10 ENCOUNTER — Encounter: Payer: Self-pay | Admitting: Emergency Medicine

## 2020-12-10 DIAGNOSIS — Z880 Allergy status to penicillin: Secondary | ICD-10-CM | POA: Diagnosis not present

## 2020-12-10 DIAGNOSIS — Z7952 Long term (current) use of systemic steroids: Secondary | ICD-10-CM | POA: Diagnosis not present

## 2020-12-10 DIAGNOSIS — J206 Acute bronchitis due to rhinovirus: Secondary | ICD-10-CM | POA: Diagnosis present

## 2020-12-10 DIAGNOSIS — J209 Acute bronchitis, unspecified: Secondary | ICD-10-CM | POA: Diagnosis not present

## 2020-12-10 DIAGNOSIS — Z9981 Dependence on supplemental oxygen: Secondary | ICD-10-CM | POA: Diagnosis not present

## 2020-12-10 DIAGNOSIS — Z20822 Contact with and (suspected) exposure to covid-19: Secondary | ICD-10-CM | POA: Diagnosis present

## 2020-12-10 DIAGNOSIS — Z720 Tobacco use: Secondary | ICD-10-CM

## 2020-12-10 DIAGNOSIS — J45901 Unspecified asthma with (acute) exacerbation: Secondary | ICD-10-CM | POA: Diagnosis present

## 2020-12-10 DIAGNOSIS — Z91018 Allergy to other foods: Secondary | ICD-10-CM | POA: Diagnosis not present

## 2020-12-10 DIAGNOSIS — Z91014 Allergy to mammalian meats: Secondary | ICD-10-CM

## 2020-12-10 DIAGNOSIS — J440 Chronic obstructive pulmonary disease with acute lower respiratory infection: Secondary | ICD-10-CM | POA: Diagnosis present

## 2020-12-10 DIAGNOSIS — Z91012 Allergy to eggs: Secondary | ICD-10-CM

## 2020-12-10 DIAGNOSIS — Z8249 Family history of ischemic heart disease and other diseases of the circulatory system: Secondary | ICD-10-CM

## 2020-12-10 DIAGNOSIS — I10 Essential (primary) hypertension: Secondary | ICD-10-CM | POA: Diagnosis present

## 2020-12-10 DIAGNOSIS — K219 Gastro-esophageal reflux disease without esophagitis: Secondary | ICD-10-CM | POA: Diagnosis present

## 2020-12-10 DIAGNOSIS — J9610 Chronic respiratory failure, unspecified whether with hypoxia or hypercapnia: Secondary | ICD-10-CM | POA: Diagnosis present

## 2020-12-10 DIAGNOSIS — J9611 Chronic respiratory failure with hypoxia: Secondary | ICD-10-CM | POA: Diagnosis not present

## 2020-12-10 DIAGNOSIS — Z7951 Long term (current) use of inhaled steroids: Secondary | ICD-10-CM | POA: Diagnosis not present

## 2020-12-10 DIAGNOSIS — J4541 Moderate persistent asthma with (acute) exacerbation: Secondary | ICD-10-CM | POA: Diagnosis present

## 2020-12-10 LAB — CBC WITH DIFFERENTIAL/PLATELET
Abs Immature Granulocytes: 0.42 10*3/uL — ABNORMAL HIGH (ref 0.00–0.07)
Basophils Absolute: 0.1 10*3/uL (ref 0.0–0.1)
Basophils Relative: 0 %
Eosinophils Absolute: 0.6 10*3/uL — ABNORMAL HIGH (ref 0.0–0.5)
Eosinophils Relative: 2 %
HCT: 44.6 % (ref 39.0–52.0)
Hemoglobin: 14.6 g/dL (ref 13.0–17.0)
Immature Granulocytes: 1 %
Lymphocytes Relative: 9 %
Lymphs Abs: 2.9 10*3/uL (ref 0.7–4.0)
MCH: 27.3 pg (ref 26.0–34.0)
MCHC: 32.7 g/dL (ref 30.0–36.0)
MCV: 83.5 fL (ref 80.0–100.0)
Monocytes Absolute: 3.2 10*3/uL — ABNORMAL HIGH (ref 0.1–1.0)
Monocytes Relative: 10 %
Neutro Abs: 23.4 10*3/uL — ABNORMAL HIGH (ref 1.7–7.7)
Neutrophils Relative %: 78 %
Platelets: 416 10*3/uL — ABNORMAL HIGH (ref 150–400)
RBC: 5.34 MIL/uL (ref 4.22–5.81)
RDW: 17.5 % — ABNORMAL HIGH (ref 11.5–15.5)
Smear Review: NORMAL
WBC: 30.6 10*3/uL — ABNORMAL HIGH (ref 4.0–10.5)
nRBC: 0 % (ref 0.0–0.2)

## 2020-12-10 LAB — COMPREHENSIVE METABOLIC PANEL
ALT: 18 U/L (ref 0–44)
AST: 21 U/L (ref 15–41)
Albumin: 3.8 g/dL (ref 3.5–5.0)
Alkaline Phosphatase: 62 U/L (ref 38–126)
Anion gap: 12 (ref 5–15)
BUN: 22 mg/dL — ABNORMAL HIGH (ref 6–20)
CO2: 33 mmol/L — ABNORMAL HIGH (ref 22–32)
Calcium: 8.9 mg/dL (ref 8.9–10.3)
Chloride: 93 mmol/L — ABNORMAL LOW (ref 98–111)
Creatinine, Ser: 0.83 mg/dL (ref 0.61–1.24)
GFR, Estimated: >60 mL/min (ref >60)
Glucose, Bld: 74 mg/dL (ref 70–99)
Potassium: 3.5 mmol/L (ref 3.5–5.1)
Sodium: 138 mmol/L (ref 135–145)
Total Bilirubin: 1.1 mg/dL (ref 0.3–1.2)
Total Protein: 7.6 g/dL (ref 6.5–8.1)

## 2020-12-10 LAB — HIV ANTIBODY (ROUTINE TESTING W REFLEX): HIV Screen 4th Generation wRfx: NONREACTIVE

## 2020-12-10 LAB — RESP PANEL BY RT-PCR (FLU A&B, COVID) ARPGX2
Influenza A by PCR: NEGATIVE
Influenza B by PCR: NEGATIVE
SARS Coronavirus 2 by RT PCR: NEGATIVE

## 2020-12-10 LAB — PATHOLOGIST SMEAR REVIEW

## 2020-12-10 LAB — PROCALCITONIN: Procalcitonin: <0.1 ng/mL

## 2020-12-10 LAB — BRAIN NATRIURETIC PEPTIDE: B Natriuretic Peptide: 33.2 pg/mL (ref 0.0–100.0)

## 2020-12-10 MED ORDER — ONDANSETRON HCL 4 MG PO TABS: 4.0000 mg | ORAL_TABLET | Freq: Four times a day (QID) | ORAL | Status: DC | PRN

## 2020-12-10 MED ORDER — METHYLPREDNISOLONE SODIUM SUCC 40 MG IJ SOLR
40.0000 mg | Freq: Two times a day (BID) | INTRAMUSCULAR | Status: AC
  Administered 2020-12-10 – 2020-12-11 (×2): 40 mg via INTRAVENOUS
  Filled 2020-12-10 (×2): qty 1

## 2020-12-10 MED ORDER — SODIUM CHLORIDE 0.9 % IV SOLN
2.0000 g | Freq: Once | INTRAVENOUS | Status: AC
  Administered 2020-12-10: 2 g via INTRAVENOUS
  Filled 2020-12-10: qty 20

## 2020-12-10 MED ORDER — HYDROCHLOROTHIAZIDE 25 MG PO TABS
25.0000 mg | ORAL_TABLET | Freq: Every day | ORAL | Status: DC
  Administered 2020-12-11 – 2020-12-13 (×3): 25 mg via ORAL
  Filled 2020-12-10 (×3): qty 1

## 2020-12-10 MED ORDER — ALBUTEROL SULFATE (2.5 MG/3ML) 0.083% IN NEBU
10.0000 mg/h | INHALATION_SOLUTION | Freq: Once | RESPIRATORY_TRACT | Status: AC
  Administered 2020-12-10: 10 mg/h via RESPIRATORY_TRACT
  Filled 2020-12-10: qty 20
  Filled 2020-12-10: qty 12

## 2020-12-10 MED ORDER — DILTIAZEM HCL 25 MG/5ML IV SOLN
5.0000 mg | Freq: Once | INTRAVENOUS | Status: DC
  Filled 2020-12-10: qty 5

## 2020-12-10 MED ORDER — IPRATROPIUM-ALBUTEROL 0.5-2.5 (3) MG/3ML IN SOLN
3.0000 mL | Freq: Once | RESPIRATORY_TRACT | Status: AC
  Administered 2020-12-10: 3 mL via RESPIRATORY_TRACT
  Filled 2020-12-10: qty 3
  Filled 2020-12-10: qty 6

## 2020-12-10 MED ORDER — MAGNESIUM SULFATE 2 GM/50ML IV SOLN
2.0000 g | Freq: Once | INTRAVENOUS | Status: AC
  Administered 2020-12-10: 2 g via INTRAVENOUS
  Filled 2020-12-10: qty 50

## 2020-12-10 MED ORDER — SODIUM CHLORIDE 0.9 % IV SOLN: 250.0000 mL | INTRAVENOUS | Status: DC | PRN

## 2020-12-10 MED ORDER — IPRATROPIUM-ALBUTEROL 0.5-2.5 (3) MG/3ML IN SOLN
3.0000 mL | Freq: Four times a day (QID) | RESPIRATORY_TRACT | Status: DC
  Administered 2020-12-10 – 2020-12-13 (×10): 3 mL via RESPIRATORY_TRACT
  Filled 2020-12-10 (×10): qty 3

## 2020-12-10 MED ORDER — METHYLPREDNISOLONE SODIUM SUCC 125 MG IJ SOLR
125.0000 mg | Freq: Once | INTRAMUSCULAR | Status: AC
  Administered 2020-12-10: 125 mg via INTRAVENOUS
  Filled 2020-12-10: qty 2

## 2020-12-10 MED ORDER — ONDANSETRON HCL 4 MG/2ML IJ SOLN: 4.0000 mg | Freq: Four times a day (QID) | INTRAMUSCULAR | Status: DC | PRN

## 2020-12-10 MED ORDER — ACETAMINOPHEN 650 MG RE SUPP
650.0000 mg | Freq: Four times a day (QID) | RECTAL | Status: DC | PRN
  Filled 2020-12-10: qty 1

## 2020-12-10 MED ORDER — ALBUTEROL SULFATE (2.5 MG/3ML) 0.083% IN NEBU: 2.5000 mg | INHALATION_SOLUTION | RESPIRATORY_TRACT | Status: DC | PRN

## 2020-12-10 MED ORDER — ACETAMINOPHEN 325 MG PO TABS: 650.0000 mg | ORAL_TABLET | Freq: Four times a day (QID) | ORAL | Status: DC | PRN

## 2020-12-10 MED ORDER — SILDENAFIL CITRATE 20 MG PO TABS
20.0000 mg | ORAL_TABLET | Freq: Three times a day (TID) | ORAL | Status: DC
  Administered 2020-12-10 – 2020-12-12 (×7): 20 mg via ORAL
  Filled 2020-12-10 (×10): qty 1

## 2020-12-10 MED ORDER — SODIUM CHLORIDE 0.9% FLUSH: 3.0000 mL | INTRAVENOUS | Status: DC | PRN

## 2020-12-10 MED ORDER — PANTOPRAZOLE SODIUM 40 MG PO TBEC
40.0000 mg | DELAYED_RELEASE_TABLET | Freq: Every day | ORAL | Status: DC
  Administered 2020-12-11 – 2020-12-13 (×3): 40 mg via ORAL
  Filled 2020-12-10 (×3): qty 1

## 2020-12-10 MED ORDER — IPRATROPIUM-ALBUTEROL 0.5-2.5 (3) MG/3ML IN SOLN
3.0000 mL | Freq: Once | RESPIRATORY_TRACT | Status: AC
  Administered 2020-12-10: 3 mL via RESPIRATORY_TRACT

## 2020-12-10 MED ORDER — MOMETASONE FURO-FORMOTEROL FUM 200-5 MCG/ACT IN AERO
2.0000 | INHALATION_SPRAY | Freq: Two times a day (BID) | RESPIRATORY_TRACT | Status: DC
  Administered 2020-12-10 – 2020-12-13 (×6): 2 via RESPIRATORY_TRACT
  Filled 2020-12-10: qty 8.8

## 2020-12-10 MED ORDER — SODIUM CHLORIDE 0.9% FLUSH
3.0000 mL | Freq: Two times a day (BID) | INTRAVENOUS | Status: DC
  Administered 2020-12-10 – 2020-12-12 (×5): 3 mL via INTRAVENOUS

## 2020-12-10 MED ORDER — PREDNISONE 20 MG PO TABS: 40.0000 mg | ORAL_TABLET | Freq: Every day | ORAL | Status: DC

## 2020-12-10 MED ORDER — AZITHROMYCIN 500 MG IV SOLR
500.0000 mg | Freq: Once | INTRAVENOUS | Status: DC
  Filled 2020-12-10 (×2): qty 500

## 2020-12-10 MED ORDER — GUAIFENESIN 100 MG/5ML PO LIQD
5.0000 mL | ORAL | Status: DC | PRN
  Filled 2020-12-10: qty 5

## 2020-12-10 MED ORDER — SODIUM CHLORIDE 0.9 % IV BOLUS
1000.0000 mL | Freq: Once | INTRAVENOUS | Status: AC
  Administered 2020-12-10: 1000 mL via INTRAVENOUS

## 2020-12-10 MED ORDER — LEVOFLOXACIN IN D5W 750 MG/150ML IV SOLN
750.0000 mg | INTRAVENOUS | Status: DC
  Administered 2020-12-10 – 2020-12-12 (×3): 750 mg via INTRAVENOUS
  Filled 2020-12-10 (×4): qty 150

## 2020-12-10 MED ORDER — ALBUTEROL SULFATE HFA 108 (90 BASE) MCG/ACT IN AERS
2.0000 | INHALATION_SPRAY | RESPIRATORY_TRACT | Status: DC | PRN
  Filled 2020-12-10: qty 6.7

## 2020-12-10 MED ORDER — MONTELUKAST SODIUM 10 MG PO TABS
10.0000 mg | ORAL_TABLET | Freq: Every day | ORAL | Status: DC
  Administered 2020-12-10 – 2020-12-12 (×3): 10 mg via ORAL
  Filled 2020-12-10 (×3): qty 1

## 2020-12-10 MED ORDER — ALUM & MAG HYDROXIDE-SIMETH 200-200-20 MG/5ML PO SUSP
30.0000 mL | ORAL | Status: DC | PRN
  Administered 2020-12-10: 30 mL via ORAL
  Filled 2020-12-10: qty 30

## 2020-12-10 NOTE — ED Triage Notes (Addendum)
C/O worsening SOB.  Was seen through ED for same recently, refused admission.  Wears 3l/ [place] home o2, arrives on RA, states out of oxygen at home.  STates symptoms had improved, but wife has a URI and things worsened again last night.  AAOx3.  Rr increased, pursed lip breathing noted.  Unable to speak in complete sentences.

## 2020-12-10 NOTE — ED Notes (Signed)
This RN called to IP unit to find out which nurse will be assigned this pt & give phone report. The unit will assign the RN & this RN to give electronic report.

## 2020-12-10 NOTE — ED Notes (Signed)
2nd msg sent to [place]le Guard, RN re: ED to [employer]. No response.

## 2020-12-10 NOTE — ED Provider Notes (Signed)
[facility] Emergency Department Provider Note  ____________________________________________   Event Date/Time   First MD Initiated Contact with Patient 12/10/20 1141     (approximate)  I have reviewed the triage vital signs and the nursing notes.   HISTORY  Chief Complaint Shortness of Breath    HPI Joseph [name] is a 40 y.o. male   with past medical history of asthma, severe, on chronic prednisone, here with shortness of breath.  The patient states that he has been significantly more short of breath over the last 24 hours.  His wife is currently diagnosed with RSV and he believes he picked it up from her.  He has had cough, sputum production, general fatigue, and dyspnea.  He uses 3 L at baseline and had to increase this to 4-1/2 L last night.  He has had associated lightheadedness.  He has had no fevers.  He has had some mild yellow-green sputum production.  No diarrhea.  No vomiting.       Past Medical History:  Diagnosis Date   Asthma    Eczema     Patient Active Problem List   Diagnosis Date Noted   Acute asthma exacerbation 12/10/2020   COPD with acute bronchitis (HCC) 12/10/2020   Chronic respiratory failure (HCC) 12/10/2020   Essential hypertension 12/10/2020   GERD (gastroesophageal reflux disease) 12/10/2020   Sepsis (HCC) 12/05/2017    History reviewed. No pertinent surgical history.  Prior to Admission medications   Medication Sig Start Date End Date Taking? Authorizing Provider  albuterol (PROVENTIL HFA;VENTOLIN HFA) 108 (90 Base) MCG/ACT inhaler Inhale 2 puffs into the lungs every 6 (six) hours as needed for wheezing or shortness of breath.   Yes [provider]  dupilumab (DUPIXENT) 300 MG/2ML prefilled syringe Inject 2 mLs into the skin every 14 (fourteen) days. 07/16/20  Yes [provider]  Fluticasone-Salmeterol (ADVAIR) 500-50 MCG/DOSE AEPB Inhale 1 puff into the lungs every 12 (twelve) hours. 07/21/14  Yes  [provider]  hydrochlorothiazide (HYDRODIURIL) 25 MG tablet Take 25 mg by mouth daily. 09/24/20  Yes [provider]  montelukast (SINGULAIR) 10 MG tablet Take 10 mg by mouth at bedtime.   Yes [provider]  omeprazole (PRILOSEC) 20 MG capsule Take 20 mg by mouth daily. 06/27/16  Yes [provider]  sildenafil (REVATIO) 20 MG tablet Take 20 mg by mouth 3 (three) times daily. 09/13/20  Yes [provider]  Tiotropium Bromide Monohydrate 2.5 MCG/ACT AERS Inhale 2 puffs into the lungs daily. 04/24/17  Yes [provider]  triamcinolone ointment (KENALOG) 0.5 % Apply 1 application topically 2 (two) times daily. 09/17/20  Yes [provider]  azithromycin (ZITHROMAX) 250 MG tablet Take 2 tabs today, then 1 tab daily x 4 days Patient not taking: No sig reported 08/01/20   Lorre Munroe, NP  guaiFENesin-dextromethorphan (ROBITUSSIN DM) 100-10 MG/5ML syrup Take 5 mLs by mouth every 4 (four) hours as needed for cough. Patient not taking: No sig reported 12/07/17   Shaune Pollack, MD  ipratropium-albuterol (DUONEB) 0.5-2.5 (3) MG/3ML SOLN Take 3 mLs by nebulization every 6 (six) hours as needed. Patient not taking: Reported on 12/10/2020 03/27/17   [name], [name]  predniSONE (DELTASONE) 10 MG tablet Take 6 tabs on day 1, 5 tabs on day 2, 4 tabs on day 3, 3 tabs on day 4, 2 tabs on day 5, 1 tab on day 6 Patient not taking: No sig reported 08/01/20   Sampson Si,  Salvadore Oxford, NP    Allergies Penicillins, Pork-derived products, Banana, and Eggs or egg-derived products  Family History  Problem Relation Age of Onset   Hypertension Mother     Social History Social History   Tobacco Use   Smoking status: Every Day   Smokeless tobacco: Never  Substance Use Topics   Alcohol use: No    Review of Systems  Review of Systems  Constitutional:  Positive for fatigue. Negative for chills and fever.  HENT:  Negative for sore throat.   Respiratory:   Positive for cough, shortness of breath and wheezing.   Cardiovascular:  Negative for chest pain.  Gastrointestinal:  Negative for abdominal pain.  Genitourinary:  Negative for flank pain.  Musculoskeletal:  Negative for neck pain.  Skin:  Negative for rash and wound.  Allergic/Immunologic: Negative for immunocompromised state.  Neurological:  Negative for weakness and numbness.  Hematological:  Does not bruise/bleed easily.  All other systems reviewed and are negative.   ____________________________________________  PHYSICAL EXAM:      VITAL SIGNS: ED Triage Vitals  Enc Vitals Group     BP 12/10/20 0842 (!) 165/101     Pulse Rate 12/10/20 0842 (!) 120     Resp 12/10/20 0842 (!) 26     Temp 12/10/20 0842 97.8 F (36.6 C)     Temp Source 12/10/20 0842 Oral     SpO2 12/10/20 0842 97 %     Weight 12/10/20 0843 139 lb 15.9 oz (63.5 kg)     Height 12/10/20 0843 5\' 5"  (1.651 m)     Head Circumference --      Peak Flow --      Pain Score 12/10/20 0843 0     Pain Loc --      Pain Edu? --      Excl. in GC? --      Physical Exam Vitals and nursing note reviewed.  Constitutional:      General: He is not in acute distress.    Appearance: He is well-developed.  HENT:     Head: Normocephalic and atraumatic.  Eyes:     Conjunctiva/sclera: Conjunctivae normal.  Cardiovascular:     Rate and Rhythm: Normal rate and regular rhythm.     Heart sounds: Normal heart sounds.  Pulmonary:     Effort: Pulmonary effort is normal. No respiratory distress.     Breath sounds: Examination of the right-upper field reveals wheezing. Examination of the left-upper field reveals wheezing. Examination of the right-middle field reveals wheezing. Examination of the left-middle field reveals wheezing. Examination of the right-lower field reveals wheezing. Examination of the left-lower field reveals wheezing. Wheezing present.  Abdominal:     General: There is no distension.  Musculoskeletal:      Cervical back: Neck supple.  Skin:    General: Skin is warm.     Capillary Refill: Capillary refill takes less than 2 seconds.     Findings: No rash.  Neurological:     Mental Status: He is alert and oriented to person, place, and time.     Motor: No abnormal muscle tone.      ____________________________________________   LABS (all labs ordered are listed, but only abnormal results are displayed)  Labs Reviewed  CBC WITH DIFFERENTIAL/PLATELET - Abnormal; Notable for the following components:      Result Value   WBC 30.6 (*)    RDW 17.5 (*)    Platelets 416 (*)    Neutro Abs 23.4 (*)  Monocytes Absolute 3.2 (*)    Eosinophils Absolute 0.6 (*)    Abs Immature Granulocytes 0.42 (*)    All other components within normal limits  COMPREHENSIVE METABOLIC PANEL - Abnormal; Notable for the following components:   Chloride 93 (*)    CO2 33 (*)    BUN 22 (*)    All other components within normal limits  RESP PANEL BY RT-PCR (FLU A&B, COVID) ARPGX2  CULTURE, BLOOD (SINGLE)  EXPECTORATED SPUTUM ASSESSMENT W GRAM STAIN, RFLX TO RESP C  PROCALCITONIN  BRAIN NATRIURETIC PEPTIDE  PATHOLOGIST SMEAR REVIEW  HIV ANTIBODY (ROUTINE TESTING W REFLEX)  CBC  BASIC METABOLIC PANEL    ____________________________________________  EKG: Sinus tachycardia, ventricular 106.  PR 120, QRS 85, QTc 424.  No acute ST elevations or depressions.  No acute evidence of acute ischemia or infarct. ________________________________________  RADIOLOGY All imaging, including plain films, CT scans, and ultrasounds, independently reviewed by me, and interpretations confirmed via formal radiology reads.  ED MD interpretation:   Chest x-ray: No acute cardiopulmonary disease  Official radiology report(s): DG Chest 2 View  Result Date: 12/10/2020 CLINICAL DATA:  Worsening SOB x 2 weeks. Wears 3l/ [place] home o2 due to COPD. Hx of asthma, COPD. Ex smoker quit 2 months ago. EXAM: CHEST - 2 VIEW COMPARISON:   11/28/2020 FINDINGS: Lungs are clear. Heart size and mediastinal contours are within normal limits. No effusion.  No pneumothorax. Multiple old rib fracture deformities. IMPRESSION: No acute cardiopulmonary disease. Electronically Signed   By: Corlis Leak M.D.   On: 12/10/2020 09:11    ____________________________________________  PROCEDURES   Procedure(s) performed (including Critical Care):  Procedures  ____________________________________________  INITIAL IMPRESSION / MDM / ASSESSMENT AND PLAN / ED COURSE  As part of my medical decision making, I reviewed the following data within the electronic MEDICAL RECORD NUMBER Nursing notes reviewed and incorporated, Old chart reviewed, Notes from prior ED visits, and [place] Controlled Substance Database       *Cristobal Advani was evaluated in Emergency Department on 12/10/2020 for the symptoms described in the history of present illness. He was evaluated in the context of the global COVID-19 pandemic, which necessitated consideration that the patient might be at risk for infection with the SARS-CoV-2 virus that causes COVID-19. Institutional protocols and algorithms that pertain to the evaluation of patients at risk for COVID-19 are in a state of rapid change based on information released by regulatory bodies including the CDC and federal and state organizations. These policies and algorithms were followed during the patient's care in the ED.  Some ED evaluations and interventions may be delayed as a result of limited staffing during the pandemic.*     Medical Decision Making:  40 yo M with h/o asthma here with acute on chronic hypoxic resp failure, likely 2/2 viral URI with known sick contacts with wife. Pt has diffuse wheezing, slight increased WOB on exam but is otherwise HDS, nontoxic. He was given steroids, nebs with minimal improvement. CXR clear. CBC shows WBC 30k. He has chronic leukocytosis possibly 2/2 his steroid use, but this is above his prior  values. Morphology unremarkable on diff. Will cover empirically for possible concomitant CAP, admit to medicine.  ____________________________________________  FINAL CLINICAL IMPRESSION(S) / ED DIAGNOSES  Final diagnoses:  Moderate persistent asthma with acute exacerbation     MEDICATIONS GIVEN DURING THIS VISIT:  Medications  azithromycin (ZITHROMAX) 500 mg in sodium chloride 0.9 % 250 mL IVPB (500 mg Intravenous Not Given 12/10/20 1510)  sildenafil (REVATIO) tablet 20 mg (0 mg Oral Hold 12/10/20 1902)  hydrochlorothiazide (HYDRODIURIL) tablet 25 mg (25 mg Oral Not Given 12/10/20 1530)  pantoprazole (PROTONIX) EC tablet 40 mg (40 mg Oral Not Given 12/10/20 1529)  mometasone-formoterol (DULERA) 200-5 MCG/ACT inhaler 2 puff (has no administration in time range)  montelukast (SINGULAIR) tablet 10 mg (has no administration in time range)  levofloxacin (LEVAQUIN) IVPB 750 mg (0 mg Intravenous Stopped 12/10/20 1854)  methylPREDNISolone sodium succinate (SOLU-MEDROL) 40 mg/mL injection 40 mg (40 mg Intravenous Given 12/10/20 1722)    Followed by  predniSONE (DELTASONE) tablet 40 mg (has no administration in time range)  ipratropium-albuterol (DUONEB) 0.5-2.5 (3) MG/3ML nebulizer solution 3 mL (0 mLs Nebulization Hold 12/10/20 1445)  albuterol (PROVENTIL) (2.5 MG/3ML) 0.083% nebulizer solution 2.5 mg (has no administration in time range)  sodium chloride flush (NS) 0.9 % injection 3 mL (3 mLs Intravenous Not Given 12/10/20 1427)  sodium chloride flush (NS) 0.9 % injection 3 mL (has no administration in time range)  0.9 %  sodium chloride infusion (has no administration in time range)  acetaminophen (TYLENOL) tablet 650 mg (has no administration in time range)    Or  acetaminophen (TYLENOL) suppository 650 mg (has no administration in time range)  ondansetron (ZOFRAN) tablet 4 mg (has no administration in time range)    Or  ondansetron (ZOFRAN) injection 4 mg (has no administration in time  range)  guaiFENesin (ROBITUSSIN) 100 MG/5ML liquid 5 mL (has no administration in time range)  ipratropium-albuterol (DUONEB) 0.5-2.5 (3) MG/3ML nebulizer solution 3 mL (3 mLs Nebulization Given 12/10/20 0847)  ipratropium-albuterol (DUONEB) 0.5-2.5 (3) MG/3ML nebulizer solution 3 mL (3 mLs Nebulization Given 12/10/20 0844)  ipratropium-albuterol (DUONEB) 0.5-2.5 (3) MG/3ML nebulizer solution 3 mL (3 mLs Nebulization Given 12/10/20 0845)  methylPREDNISolone sodium succinate (SOLU-MEDROL) 125 mg/2 mL injection 125 mg (125 mg Intravenous Given 12/10/20 0851)  albuterol (PROVENTIL) (2.5 MG/3ML) 0.083% nebulizer solution (10 mg/hr Nebulization Given 12/10/20 1431)  magnesium sulfate IVPB 2 g 50 mL (0 g Intravenous Stopped 12/10/20 1430)  cefTRIAXone (ROCEPHIN) 2 g in sodium chloride 0.9 % 100 mL IVPB (0 g Intravenous Stopped 12/10/20 1504)  sodium chloride 0.9 % bolus 1,000 mL (1,000 mLs Intravenous New Bag/Given 12/10/20 1228)     ED Discharge Orders     None        Note:  This document was prepared using Dragon voice recognition software and may include unintentional dictation errors.   Shaune Pollack, MD 12/10/20 [phone]

## 2020-12-10 NOTE — ED Notes (Signed)
COVID swab sent to lab.

## 2020-12-10 NOTE — ED Provider Notes (Signed)
Emergency Medicine Provider Triage Evaluation Note  Joseph [name] , a 40 y.o. male  was evaluated in triage.  Pt complains of difficulty breathing, asthma attack.  Review of Systems  Positive: Difficulty breathing, AsthmA attack, shortness of breath Negative: Fever, chills, chest pain  Physical Exam  There were no vitals taken for this visit. Gen:   Awake, mild respiratory distress Resp:  Increased respiratory effort MSK:   Moves extremities without difficulty  Other:    Medical Decision Making  Medically screening exam initiated at 8:38 AM.  Appropriate orders placed.  Joseph [name] was informed that the remainder of the evaluation will be completed by another provider, this initial triage assessment does not replace that evaluation, and the importance of remaining in the ED until their evaluation is complete.  Patient appears to have difficulty breathing in triage.  DuoNeb started.  Will evaluate after DuoNeb X3, SOLUMEDROL also ordered.  Pt is on 3 L o2 at home   [name], [name] 12/10/20 0355    [name], MD 12/10/20 1140

## 2020-12-10 NOTE — ED Notes (Addendum)
Secure msg sent to Windle Guard, RN for ED to [employer]. No response.

## 2020-12-10 NOTE — ED Notes (Signed)
Patient placed on 3l/ [place]

## 2020-12-10 NOTE — ED Notes (Signed)
Still awaiting response from IP RN. Bed has been assigned for 1hr 45 mins. This RN has called the floor & sent 2 secure msgs to receiving RN, Tresa Endo Isenhour, with no response.

## 2020-12-10 NOTE — H&P (Signed)
History and Physical    Joseph [name] YEL:[id number] DOB: 05/22/1980 DOA: 12/10/2020  PCP: Lydia Guiles, MD   Patient coming from: Home  I have personally briefly reviewed patient's old medical records in [facility]  Chief Complaint: Shortness of breath  HPI: Joseph [name] is a 40 y.o. male with medical history significant for asthma/COPD with chronic respiratory failure on 2 L of oxygen continuous, history of marijuana use who presents to the ER for evaluation of shortness of breath 1 day prior to his admission. Patient has a history of chronic persistent asthma and is on chronic steroid therapy who presents to the ER for evaluation of worsening shortness of breath from his baseline. Patient was seen in the emergency room on 10/16 for evaluation of shortness of breath presumed to be from an acute asthma flare.  He was offered hospitalization but declined and signed out AGAINST MEDICAL ADVICE.  He was given a prescription for doxycycline and a prednisone taper which he completed with improvement in his symptoms.  He states that his wife has an upper respiratory tract infection from RSV and 1 night prior to his admission he developed worsening shortness of breath associated with diffuse audible wheezes and a cough productive of yellow phlegm. He has pleuritic chest pain but denies having any fever or chills. He has no leg swelling, no nausea, no vomiting, no headache, no dizziness, no lightheadedness, no abdominal pain, no changes in his bowel habits, no urinary symptoms, no palpitations, no diaphoresis, no orthopnea or paroxysmal nocturnal dyspnea, no blurred vision or focal deficit. Labs show sodium 138, potassium 3.5, chloride 93, bicarb 33, glucose 74, BUN 22, creatinine 0.83, calcium 8.9, alkaline phosphatase 62, albumin 3.8, AST 21, ALT 18, total protein 7.6, white count 30.6, hemoglobin 14.6, hematocrit 45.6, MCV 83.5, RDW 17.5, platelet count 416 Chest x-ray reviewed by me shows  no acute cardiopulmonary disease Twelve-lead EKG reviewed by me shows sinus tachycardia   ED Course: Patient is a 40 year old male with a history of chronic persistent asthma/COPD with chronic respiratory failure on 2 L of oxygen who presents to the ER for evaluation of worsening shortness of breath from his baseline associated with a cough productive of yellow phlegm and diffuse wheezes. Patient states that he had to increase his home oxygen to 4 L prior to coming to the ER for symptom relief. He is down to 3 L via nasal cannula. Patient received IV magnesium, IV Solu-Medrol, bronchodilator therapy and IV antibiotics in the emergency room and will be admitted to the hospital for further evaluation.    Review of Systems: As per HPI otherwise all other systems reviewed and negative.    Past Medical History:  Diagnosis Date   Asthma    Eczema     History reviewed. No pertinent surgical history.   reports that he has been smoking. He has never used smokeless tobacco. He reports that he does not drink alcohol. No history on file for drug use.  Allergies  Allergen Reactions   Penicillins Shortness Of Breath   Pork-Derived Products Nausea Only   Banana Hives   Eggs Or Egg-Derived Products Hives    Family History  Problem Relation Age of Onset   Hypertension Mother       Prior to Admission medications   Medication Sig Start Date End Date Taking? Authorizing Provider  dupilumab (DUPIXENT) 300 MG/2ML prefilled syringe Inject 2 mLs into the skin every 14 (fourteen) days. 07/16/20  Yes [provider]  albuterol (PROVENTIL  HFA;VENTOLIN HFA) 108 (90 Base) MCG/ACT inhaler Inhale 2 puffs into the lungs every 6 (six) hours as needed for wheezing or shortness of breath.    [provider]  azithromycin (ZITHROMAX) 250 MG tablet Take 2 tabs today, then 1 tab daily x 4 days Patient not taking: Reported on 12/10/2020 08/01/20   Lorre Munroe, NP  Fluticasone-Salmeterol  (ADVAIR) 500-50 MCG/DOSE AEPB Inhale 1 puff into the lungs every 12 (twelve) hours. 07/21/14   [provider]  guaiFENesin-dextromethorphan (ROBITUSSIN DM) 100-10 MG/5ML syrup Take 5 mLs by mouth every 4 (four) hours as needed for cough. Patient not taking: Reported on 12/10/2020 12/07/17   Shaune Pollack, MD  hydrochlorothiazide (HYDRODIURIL) 25 MG tablet Take 25 mg by mouth daily. 09/24/20   [provider]  ipratropium-albuterol (DUONEB) 0.5-2.5 (3) MG/3ML SOLN Take 3 mLs by nebulization every 6 (six) hours as needed. 03/27/17   Fisher, Roselyn Bering, [name]  montelukast (SINGULAIR) 10 MG tablet Take 10 mg by mouth at bedtime.    [provider]  omeprazole (PRILOSEC) 20 MG capsule Take 20 mg by mouth daily. 06/27/16   [provider]  predniSONE (DELTASONE) 10 MG tablet Take 6 tabs on day 1, 5 tabs on day 2, 4 tabs on day 3, 3 tabs on day 4, 2 tabs on day 5, 1 tab on day 6 Patient not taking: Reported on 12/10/2020 08/01/20   Lorre Munroe, NP  sildenafil (REVATIO) 20 MG tablet Take 20 mg by mouth 3 (three) times daily. 09/13/20   [provider]  Tiotropium Bromide Monohydrate (SPIRIVA RESPIMAT) 2.5 MCG/ACT AERS Inhale 2 puffs into the lungs daily. 04/24/17   [provider]  triamcinolone ointment (KENALOG) 0.5 % Apply 1 application topically 2 (two) times daily. 09/17/20   [provider]    Physical Exam: Vitals:   12/10/20 1140 12/10/20 1200 12/10/20 1230 12/10/20 1330  BP:  (!) 142/104 (!) 157/107 (!) 154/101  Pulse: (!) 106 89 (!) 103 (!) 103  Resp: (!) 21 (!) 21 (!) 21 19  Temp:      TempSrc:      SpO2: 97% 96% 96% 97%  Weight:      Height:         Vitals:   12/10/20 1140 12/10/20 1200 12/10/20 1230 12/10/20 1330  BP:  (!) 142/104 (!) 157/107 (!) 154/101  Pulse: (!) 106 89 (!) 103 (!) 103  Resp: (!) 21 (!) 21 (!) 21 19  Temp:      TempSrc:      SpO2: 97% 96% 96% 97%  Weight:      Height:          Constitutional: Alert  and oriented x 3 .  Appears to be in mild respiratory distress.  Able to speak in full sentences.  Has audible wheezes.  Cushingoid facies HEENT:      Head: Normocephalic and atraumatic.         Eyes: PERLA, EOMI, Conjunctivae are normal. Sclera is non-icteric.       Mouth/Throat: Mucous membranes are moist.       Neck: Supple with no signs of meningismus. Cardiovascular: Tachycardia. No murmurs, gallops, or rubs. 2+ symmetrical distal pulses are present . No JVD. No LE edema.  Reproducible chest pain Respiratory: Tachypnea. diffuse wheezes in all lung fields. No crackles, or rhonchi.  Gastrointestinal: Soft, non tender, and non distended with positive bowel sounds.  Genitourinary: No CVA tenderness. Musculoskeletal: Nontender with normal range of motion in  all extremities. No cyanosis, or erythema of extremities. Neurologic:  Face is symmetric. Moving all extremities. No gross focal neurologic deficits . Skin: Skin is warm, dry.  No rash or ulcers Psychiatric: Mood and affect are normal    Labs on Admission: I have personally reviewed following labs and imaging studies  CBC: Recent Labs  Lab 12/10/20 0849  WBC 30.6*  NEUTROABS 23.4*  HGB 14.6  HCT 44.6  MCV 83.5  PLT 416*   Basic Metabolic Panel: Recent Labs  Lab 12/10/20 0849  NA 138  K 3.5  CL 93*  CO2 33*  GLUCOSE 74  BUN 22*  CREATININE 0.83  CALCIUM 8.9   GFR: Estimated Creatinine Clearance: 100.9 mL/min (by C-G formula based on SCr of 0.83 mg/dL). Liver Function Tests: Recent Labs  Lab 12/10/20 0849  AST 21  ALT 18  ALKPHOS 62  BILITOT 1.1  PROT 7.6  ALBUMIN 3.8   No results for input(s): LIPASE, AMYLASE in the last 168 hours. No results for input(s): AMMONIA in the last 168 hours. Coagulation Profile: No results for input(s): INR, PROTIME in the last 168 hours. Cardiac Enzymes: No results for input(s): CKTOTAL, CKMB, CKMBINDEX, TROPONINI in the last 168 hours. BNP (last 3 results) No results for  input(s): PROBNP in the last 8760 hours. HbA1C: No results for input(s): HGBA1C in the last 72 hours. CBG: No results for input(s): GLUCAP in the last 168 hours. Lipid Profile: No results for input(s): CHOL, HDL, LDLCALC, TRIG, CHOLHDL, LDLDIRECT in the last 72 hours. Thyroid Function Tests: No results for input(s): TSH, T4TOTAL, FREET4, T3FREE, THYROIDAB in the last 72 hours. Anemia Panel: No results for input(s): VITAMINB12, FOLATE, FERRITIN, TIBC, IRON, RETICCTPCT in the last 72 hours. Urine analysis:    Component Value Date/Time   COLORURINE YELLOW (A) 12/05/2017 0823   APPEARANCEUR CLEAR (A) 12/05/2017 0823   LABSPEC 1.016 12/05/2017 0823   PHURINE 7.0 12/05/2017 0823   GLUCOSEU NEGATIVE 12/05/2017 0823   HGBUR NEGATIVE 12/05/2017 0823   BILIRUBINUR NEGATIVE 12/05/2017 0823   KETONESUR NEGATIVE 12/05/2017 0823   PROTEINUR NEGATIVE 12/05/2017 0823   NITRITE NEGATIVE 12/05/2017 0823   LEUKOCYTESUR NEGATIVE 12/05/2017 0823    Radiological Exams on Admission: DG Chest 2 View  Result Date: 12/10/2020 CLINICAL DATA:  Worsening SOB x 2 weeks. Wears 3l/ [place] home o2 due to COPD. Hx of asthma, COPD. Ex smoker quit 2 months ago. EXAM: CHEST - 2 VIEW COMPARISON:  11/28/2020 FINDINGS: Lungs are clear. Heart size and mediastinal contours are within normal limits. No effusion.  No pneumothorax. Multiple old rib fracture deformities. IMPRESSION: No acute cardiopulmonary disease. Electronically Signed   By: Corlis Leak M.D.   On: 12/10/2020 09:11     Assessment/Plan Principal Problem:   Acute asthma exacerbation Active Problems:   COPD with acute bronchitis (HCC)   Chronic respiratory failure (HCC)   Essential hypertension   GERD (gastroesophageal reflux disease)      Patient is a 40 year old with a history of chronic persistent asthma/COPD with chronic respiratory failure who presents to the ER for evaluation of worsening shortness of breath compared to his baseline.     Acute  asthma exacerbation/COPD with acute bronchitis Patient has a history of chronic persistent asthma and is on steroid therapy. He also has a history of COPD with chronic respiratory failure who presents for evaluation of worsening shortness of breath from his baseline associated with a cough productive of yellow phlegm as well as diffuse wheezes. Place patient on  scheduled and as needed bronchodilator therapy Place patient on IV Solu-Medrol Will place patient on Levaquin 750 mg IV daily to complete a 5-day course of therapy Continue oxygen supplementation to maintain pulse oximetry greater than 92%      Hypertension Continue hydrochlorothiazide    Gerd Continue oral PPI   DVT prophylaxis: SCD  Code Status: full code  Family Communication: Greater than 50% of time was spent discussing patient's condition and plan of care with him at the bedside.  All questions and concerns have been addressed.  He verbalizes understanding and agrees with the plan. Disposition Plan: Back to previous home environment Consults called: none  Status:At the time of admission, it appears that the appropriate admission status for this patient is inpatient. This is judged to be reasonable and necessary to provide the required intensity of service to ensure the patient's safety given the presenting symptoms, physical exam findings, and initial radiographic and laboratory data in the context of their comorbid conditions. Patient requires inpatient status due to high intensity of service, high risk for further deterioration and high frequency of surveillance required.     Lucile Shutters MD [facility]     12/10/2020, 1:58 PM

## 2020-12-10 NOTE — ED Notes (Signed)
Secure msg sent to pharmacy re: unable to remove azithromycin from Pyxis.

## 2020-12-11 DIAGNOSIS — J45901 Unspecified asthma with (acute) exacerbation: Secondary | ICD-10-CM

## 2020-12-11 DIAGNOSIS — K219 Gastro-esophageal reflux disease without esophagitis: Secondary | ICD-10-CM | POA: Diagnosis not present

## 2020-12-11 DIAGNOSIS — J440 Chronic obstructive pulmonary disease with acute lower respiratory infection: Secondary | ICD-10-CM | POA: Diagnosis not present

## 2020-12-11 DIAGNOSIS — I10 Essential (primary) hypertension: Secondary | ICD-10-CM | POA: Diagnosis not present

## 2020-12-11 DIAGNOSIS — J209 Acute bronchitis, unspecified: Secondary | ICD-10-CM

## 2020-12-11 LAB — BASIC METABOLIC PANEL
Anion gap: 2 — ABNORMAL LOW (ref 5–15)
BUN: 24 mg/dL — ABNORMAL HIGH (ref 6–20)
CO2: 34 mmol/L — ABNORMAL HIGH (ref 22–32)
Calcium: 8.5 mg/dL — ABNORMAL LOW (ref 8.9–10.3)
Chloride: 101 mmol/L (ref 98–111)
Creatinine, Ser: 0.79 mg/dL (ref 0.61–1.24)
GFR, Estimated: >60 mL/min (ref >60)
Glucose, Bld: 121 mg/dL — ABNORMAL HIGH (ref 70–99)
Potassium: 4.6 mmol/L (ref 3.5–5.1)
Sodium: 137 mmol/L (ref 135–145)

## 2020-12-11 LAB — CBC
HCT: 39.9 % (ref 39.0–52.0)
Hemoglobin: 13.3 g/dL (ref 13.0–17.0)
MCH: 27.8 pg (ref 26.0–34.0)
MCHC: 33.3 g/dL (ref 30.0–36.0)
MCV: 83.5 fL (ref 80.0–100.0)
Platelets: 370 10*3/uL (ref 150–400)
RBC: 4.78 MIL/uL (ref 4.22–5.81)
RDW: 17.1 % — ABNORMAL HIGH (ref 11.5–15.5)
WBC: 26.5 10*3/uL — ABNORMAL HIGH (ref 4.0–10.5)
nRBC: 0 % (ref 0.0–0.2)

## 2020-12-11 MED ORDER — METHYLPREDNISOLONE SODIUM SUCC 40 MG IJ SOLR: 40.0000 mg | Freq: Every day | INTRAMUSCULAR | Status: DC

## 2020-12-11 MED ORDER — METHYLPREDNISOLONE SODIUM SUCC 40 MG IJ SOLR
40.0000 mg | Freq: Two times a day (BID) | INTRAMUSCULAR | Status: DC
  Administered 2020-12-11 – 2020-12-13 (×4): 40 mg via INTRAVENOUS
  Filled 2020-12-11 (×4): qty 1

## 2020-12-11 NOTE — Plan of Care (Signed)
Continuing with the plan of care.

## 2020-12-11 NOTE — Progress Notes (Signed)
PROGRESS NOTE    Joseph [name]  FMB:[id number] DOB: 1980-11-01 DOA: 12/10/2020 PCP: Lydia Guiles, MD    Brief Narrative:   Joseph [name] is a 40 y.o. male with medical history significant for asthma/COPD with chronic respiratory failure on 2 L of oxygen continuous, history of marijuana use who presents to the ER for evaluation of shortness of breath 1 day prior to his admission.    Consultants:    Procedures:   Antimicrobials:  Levofloxacin azith    Subjective: Feels better, not at baseline, as he reports wheezing. Walked in hallway on room air 02 dropped to 88% was placed on 2L with 02 up low 90's.no cp  Objective: Vitals:   12/10/20 2302 12/11/20 0227 12/11/20 0507 12/11/20 0722  BP: (!) 150/99  132/88 (!) 127/93  Pulse: (!) 115  91 96  Resp: 20  18 18   Temp: 97.8 F (36.6 C)  97.7 F (36.5 C) 98.6 F (37 C)  TempSrc: Oral  Oral   SpO2: 93% 95% 97% 97%  Weight:      Height:        Intake/Output Summary (Last 24 hours) at 12/11/2020 0825 Last data filed at 12/10/2020 1854 Gross per 24 hour  Intake 300 ml  Output --  Net 300 ml   Filed Weights   12/10/20 0843 12/10/20 1934  Weight: 63.5 kg 83 kg    Examination:  General exam: Appears calm and comfortable  Respiratory system: mildy rhonchorus, exp wheezing mild Cardiovascular system: S1 & S2 heard, RRR. No JVD, murmurs, rubs, gallops or clicks.  Gastrointestinal system: Abdomen is nondistended, soft and nontendeNormal bowel sounds heard. Central nervous system: Alert and oriented. No focal neurological deficits. Extremities:no edema Psychiatry: Judgement and insight appear normal. Mood & affect appropriate.     Data Reviewed: I have personally reviewed following labs and imaging studies  CBC: Recent Labs  Lab 12/10/20 0849 12/11/20 0434  WBC 30.6* 26.5*  NEUTROABS 23.4*  --   HGB 14.6 13.3  HCT 44.6 39.9  MCV 83.5 83.5  PLT 416* 370   Basic Metabolic Panel: Recent Labs  Lab  12/10/20 0849 12/11/20 0434  NA 138 137  K 3.5 4.6  CL 93* 101  CO2 33* 34*  GLUCOSE 74 121*  BUN 22* 24*  CREATININE 0.83 0.79  CALCIUM 8.9 8.5*   GFR: Estimated Creatinine Clearance: 119.3 mL/min (by C-G formula based on SCr of 0.79 mg/dL). Liver Function Tests: Recent Labs  Lab 12/10/20 0849  AST 21  ALT 18  ALKPHOS 62  BILITOT 1.1  PROT 7.6  ALBUMIN 3.8   No results for input(s): LIPASE, AMYLASE in the last 168 hours. No results for input(s): AMMONIA in the last 168 hours. Coagulation Profile: No results for input(s): INR, PROTIME in the last 168 hours. Cardiac Enzymes: No results for input(s): CKTOTAL, CKMB, CKMBINDEX, TROPONINI in the last 168 hours. BNP (last 3 results) No results for input(s): PROBNP in the last 8760 hours. HbA1C: No results for input(s): HGBA1C in the last 72 hours. CBG: No results for input(s): GLUCAP in the last 168 hours. Lipid Profile: No results for input(s): CHOL, HDL, LDLCALC, TRIG, CHOLHDL, LDLDIRECT in the last 72 hours. Thyroid Function Tests: No results for input(s): TSH, T4TOTAL, FREET4, T3FREE, THYROIDAB in the last 72 hours. Anemia Panel: No results for input(s): VITAMINB12, FOLATE, FERRITIN, TIBC, IRON, RETICCTPCT in the last 72 hours. Sepsis Labs: Recent Labs  Lab 12/10/20 0849  PROCALCITON <0.10    Recent Results (from the  past 240 hour(s))  Blood culture (single)     Status: None (Preliminary result)   Collection Time: 12/10/20 12:07 PM   Specimen: BLOOD  Result Value Ref Range Status   Specimen Description BLOOD LEFT AC  Final   Special Requests   Final    BOTTLES DRAWN AEROBIC AND ANAEROBIC Blood Culture adequate volume   Culture   Final    NO GROWTH < 24 HOURS Performed at [facility], [address], [place], [place] [address]    Report Status PENDING  Incomplete  Resp Panel by RT-PCR (Flu A&B, Covid) Nasopharyngeal Swab     Status: None   Collection Time: 12/10/20  2:41 PM   Specimen:  Nasopharyngeal Swab; Nasopharyngeal(NP) swabs in vial transport medium  Result Value Ref Range Status   SARS Coronavirus 2 by RT PCR NEGATIVE NEGATIVE Final    Comment: (NOTE) SARS-CoV-2 target nucleic acids are NOT DETECTED.  The SARS-CoV-2 RNA is generally detectable in upper respiratory specimens during the acute phase of infection. The lowest concentration of SARS-CoV-2 viral copies this assay can detect is 138 copies/mL. A negative result does not preclude SARS-Cov-2 infection and should not be used as the sole basis for treatment or other patient management decisions. A negative result may occur with  improper specimen collection/handling, submission of specimen other than nasopharyngeal swab, presence of viral mutation(s) within the areas targeted by this assay, and inadequate number of viral copies(<138 copies/mL). A negative result must be combined with clinical observations, patient history, and epidemiological information. The expected result is Negative.  Fact Sheet for Patients:  BloggerCourse.com  Fact Sheet for Healthcare Providers:  SeriousBroker.it  This test is no t yet approved or cleared by the [place] FDA and  has been authorized for detection and/or diagnosis of SARS-CoV-2 by FDA under an Emergency Use Authorization (EUA). This EUA will remain  in effect (meaning this test can be used) for the duration of the COVID-19 declaration under Section 564(b)(1) of the Act, 21 U.S.C.section 360bbb-3(b)(1), unless the authorization is terminated  or revoked sooner.       Influenza A by PCR NEGATIVE NEGATIVE Final   Influenza B by PCR NEGATIVE NEGATIVE Final    Comment: (NOTE) The Xpert Xpress SARS-CoV-2/FLU/RSV plus assay is intended as an aid in the diagnosis of influenza from Nasopharyngeal swab specimens and should not be used as a sole basis for treatment. Nasal washings and aspirates are unacceptable for  Xpert Xpress SARS-CoV-2/FLU/RSV testing.  Fact Sheet for Patients: BloggerCourse.com  Fact Sheet for Healthcare Providers: SeriousBroker.it  This test is not yet approved or cleared by the [place] FDA and has been authorized for detection and/or diagnosis of SARS-CoV-2 by FDA under an Emergency Use Authorization (EUA). This EUA will remain in effect (meaning this test can be used) for the duration of the COVID-19 declaration under Section 564(b)(1) of the Act, 21 U.S.C. section 360bbb-3(b)(1), unless the authorization is terminated or revoked.  Performed at [facility], [address] [place], [place] [address]          Radiology Studies: DG Chest 2 View  Result Date: 12/10/2020 CLINICAL DATA:  Worsening SOB x 2 weeks. Wears 3l/ [place] home o2 due to COPD. Hx of asthma, COPD. Ex smoker quit 2 months ago. EXAM: CHEST - 2 VIEW COMPARISON:  11/28/2020 FINDINGS: Lungs are clear. Heart size and mediastinal contours are within normal limits. No effusion.  No pneumothorax. Multiple old rib fracture deformities. IMPRESSION: No acute cardiopulmonary disease.  Electronically Signed   By: Corlis Leak M.D.   On: 12/10/2020 09:11        Scheduled Meds:  hydrochlorothiazide  25 mg Oral Daily   ipratropium-albuterol  3 mL Nebulization Q6H   mometasone-formoterol  2 puff Inhalation BID   montelukast  10 mg Oral QHS   pantoprazole  40 mg Oral Daily   predniSONE  40 mg Oral Q breakfast   sildenafil  20 mg Oral TID   sodium chloride flush  3 mL Intravenous Q12H   Continuous Infusions:  sodium chloride     azithromycin (ZITHROMAX) 500 MG IVPB (Vial-Mate [employer])     levofloxacin (LEVAQUIN) IV Stopped (12/10/20 1854)    Assessment & Plan:   Principal Problem:   Acute asthma exacerbation Active Problems:   COPD with acute bronchitis (HCC)   Chronic respiratory failure (HCC)   Essential hypertension   GERD  (gastroesophageal reflux disease)   Patient is a 40 year old with a history of chronic persistent asthma/COPD with chronic respiratory failure who presents to the ER for evaluation of worsening shortness of breath compared to his baseline.         Acute asthma exacerbation/COPD with acute bronchitis Patient has a history of chronic persistent asthma and is on steroid therapy. He also has a history of COPD with chronic respiratory failure who presents for evaluation of worsening shortness of breath from his baseline associated with a cough productive of yellow phlegm as well as diffuse wheezes. 10/29 switch p.o. prednisone back to IV Solu-Medrol  Continue Levaquin to complete 5-day course  Continue oxygen supplementation  I-S and flutter valves  Will check respiratory panel COVID-negative       Hypertension Continue hydrochlorothiazide continue PPI       Gerd Continue oral PPI     DVT prophylaxis: scd Code Status:full Family Communication: None at bedside Disposition Plan: Back home Status is: Inpatient  Remains inpatient appropriate because: Requiring IV treatment, still wheezy.            LOS: 1 day   Time spent: 35 minutes with more than 50% on COC    Lynn Ito, MD [facility] Pager [phone]  If 7PM-7AM, please contact night-coverage 12/11/2020, 8:25 AM

## 2020-12-12 DIAGNOSIS — J209 Acute bronchitis, unspecified: Secondary | ICD-10-CM | POA: Diagnosis not present

## 2020-12-12 DIAGNOSIS — J440 Chronic obstructive pulmonary disease with acute lower respiratory infection: Secondary | ICD-10-CM | POA: Diagnosis not present

## 2020-12-12 DIAGNOSIS — I10 Essential (primary) hypertension: Secondary | ICD-10-CM | POA: Diagnosis not present

## 2020-12-12 DIAGNOSIS — K219 Gastro-esophageal reflux disease without esophagitis: Secondary | ICD-10-CM | POA: Diagnosis not present

## 2020-12-12 LAB — RESPIRATORY PANEL BY PCR

## 2020-12-12 MED ORDER — ASCORBIC ACID 500 MG PO TABS
500.0000 mg | ORAL_TABLET | Freq: Every day | ORAL | Status: DC
  Administered 2020-12-12 – 2020-12-13 (×2): 500 mg via ORAL
  Filled 2020-12-12 (×2): qty 1

## 2020-12-12 MED ORDER — GUAIFENESIN ER 600 MG PO TB12
600.0000 mg | ORAL_TABLET | Freq: Two times a day (BID) | ORAL | Status: DC
  Administered 2020-12-12 – 2020-12-13 (×3): 600 mg via ORAL
  Filled 2020-12-12 (×3): qty 1

## 2020-12-12 NOTE — Progress Notes (Addendum)
PROGRESS NOTE    Joseph [name]  QJJ:[id number] DOB: [birth year] DOA: 12/10/2020 PCP: Lydia Guiles, MD    Brief Narrative:   Joseph [name] is a 40 y.o. male with medical history significant for asthma/COPD with chronic respiratory failure on 2 L of oxygen continuous, history of marijuana use who presents to the ER for evaluation of shortness of breath 1 day prior to his admission.  10/30- on 2L at rest. Has wheezing. Some cough. Feels little better  Consultants:    Procedures:   Antimicrobials:  Levofloxacin azith    Subjective: No cp, dizziness  Objective: Vitals:   12/12/20 0422 12/12/20 0734 12/12/20 0740 12/12/20 1311  BP: 128/87  (!) 131/96   Pulse: 77 99 86 (!) 114  Resp:  18 18 18   Temp:   97.8 F (36.6 C)   TempSrc:   Oral   SpO2:  92% 98% 92%  Weight:      Height:       No intake or output data in the 24 hours ending 12/12/20 1339  Filed Weights   12/10/20 0843 12/10/20 1934  Weight: 63.5 kg 83 kg    Examination: Nad, calm Exp. Wheezing , no rales Rrr s1/s2 no gallop Soft benign +bs No edema aaoxox3   Data Reviewed: I have personally reviewed following labs and imaging studies  CBC: Recent Labs  Lab 12/10/20 0849 12/11/20 0434  WBC 30.6* 26.5*  NEUTROABS 23.4*  --   HGB 14.6 13.3  HCT 44.6 39.9  MCV 83.5 83.5  PLT 416* 370   Basic Metabolic Panel: Recent Labs  Lab 12/10/20 0849 12/11/20 0434  NA 138 137  K 3.5 4.6  CL 93* 101  CO2 33* 34*  GLUCOSE 74 121*  BUN 22* 24*  CREATININE 0.83 0.79  CALCIUM 8.9 8.5*   GFR: Estimated Creatinine Clearance: 119.3 mL/min (by C-G formula based on SCr of 0.79 mg/dL). Liver Function Tests: Recent Labs  Lab 12/10/20 0849  AST 21  ALT 18  ALKPHOS 62  BILITOT 1.1  PROT 7.6  ALBUMIN 3.8   No results for input(s): LIPASE, AMYLASE in the last 168 hours. No results for input(s): AMMONIA in the last 168 hours. Coagulation Profile: No results for input(s): INR, PROTIME in the last  168 hours. Cardiac Enzymes: No results for input(s): CKTOTAL, CKMB, CKMBINDEX, TROPONINI in the last 168 hours. BNP (last 3 results) No results for input(s): PROBNP in the last 8760 hours. HbA1C: No results for input(s): HGBA1C in the last 72 hours. CBG: No results for input(s): GLUCAP in the last 168 hours. Lipid Profile: No results for input(s): CHOL, HDL, LDLCALC, TRIG, CHOLHDL, LDLDIRECT in the last 72 hours. Thyroid Function Tests: No results for input(s): TSH, T4TOTAL, FREET4, T3FREE, THYROIDAB in the last 72 hours. Anemia Panel: No results for input(s): VITAMINB12, FOLATE, FERRITIN, TIBC, IRON, RETICCTPCT in the last 72 hours. Sepsis Labs: Recent Labs  Lab 12/10/20 0849  PROCALCITON <0.10    Recent Results (from the past 240 hour(s))  Blood culture (single)     Status: None (Preliminary result)   Collection Time: 12/10/20 12:07 PM   Specimen: BLOOD  Result Value Ref Range Status   Specimen Description BLOOD LEFT [facility]  Final   Special Requests   Final    BOTTLES DRAWN AEROBIC AND ANAEROBIC Blood Culture adequate volume   Culture   Final    NO GROWTH 2 DAYS Performed at [facility], [address], [place], [place]    Report  Status PENDING  Incomplete  Resp Panel by RT-PCR (Flu A&B, Covid) Nasopharyngeal Swab     Status: None   Collection Time: 12/10/20  2:41 PM   Specimen: Nasopharyngeal Swab; Nasopharyngeal(NP) swabs in vial transport medium  Result Value Ref Range Status   SARS Coronavirus 2 by RT PCR NEGATIVE NEGATIVE Final    Comment: (NOTE) SARS-CoV-2 target nucleic acids are NOT DETECTED.  The SARS-CoV-2 RNA is generally detectable in upper respiratory specimens during the acute phase of infection. The lowest concentration of SARS-CoV-2 viral copies this assay can detect is 138 copies/mL. A negative result does not preclude SARS-Cov-2 infection and should not be used as the sole basis for treatment or other patient management decisions. A  negative result may occur with  improper specimen collection/handling, submission of specimen other than nasopharyngeal swab, presence of viral mutation(s) within the areas targeted by this assay, and inadequate number of viral copies(<138 copies/mL). A negative result must be combined with clinical observations, patient history, and epidemiological information. The expected result is Negative.  Fact Sheet for Patients:  BloggerCourse.com  Fact Sheet for Healthcare Providers:  SeriousBroker.it  This test is no t yet approved or cleared by the [place] FDA and  has been authorized for detection and/or diagnosis of SARS-CoV-2 by FDA under an Emergency Use Authorization (EUA). This EUA will remain  in effect (meaning this test can be used) for the duration of the COVID-19 declaration under Section 564(b)(1) of the Act, 21 U.S.C.section 360bbb-3(b)(1), unless the authorization is terminated  or revoked sooner.       Influenza A by PCR NEGATIVE NEGATIVE Final   Influenza B by PCR NEGATIVE NEGATIVE Final    Comment: (NOTE) The Xpert Xpress SARS-CoV-2/FLU/RSV plus assay is intended as an aid in the diagnosis of influenza from Nasopharyngeal swab specimens and should not be used as a sole basis for treatment. Nasal washings and aspirates are unacceptable for Xpert Xpress SARS-CoV-2/FLU/RSV testing.  Fact Sheet for Patients: BloggerCourse.com  Fact Sheet for Healthcare Providers: SeriousBroker.it  This test is not yet approved or cleared by the [place] FDA and has been authorized for detection and/or diagnosis of SARS-CoV-2 by FDA under an Emergency Use Authorization (EUA). This EUA will remain in effect (meaning this test can be used) for the duration of the COVID-19 declaration under Section 564(b)(1) of the Act, 21 U.S.C. section 360bbb-3(b)(1), unless the authorization  is terminated or revoked.  Performed at [facility], [address] [place], [place] [address]   Respiratory (~20 pathogens) panel by PCR     Status: Abnormal   Collection Time: 12/11/20 12:35 PM   Specimen: Nasopharyngeal Swab; Respiratory  Result Value Ref Range Status   Adenovirus NOT DETECTED NOT DETECTED Final   Coronavirus 229E NOT DETECTED NOT DETECTED Final    Comment: (NOTE) The Coronavirus on the Respiratory Panel, DOES NOT test for the novel  Coronavirus (2019 nCoV)    Coronavirus HKU1 NOT DETECTED NOT DETECTED Final   Coronavirus NL63 NOT DETECTED NOT DETECTED Final   Coronavirus OC43 NOT DETECTED NOT DETECTED Final   Metapneumovirus NOT DETECTED NOT DETECTED Final   Rhinovirus / Enterovirus DETECTED (A) NOT DETECTED Final   Influenza A NOT DETECTED NOT DETECTED Final   Influenza B NOT DETECTED NOT DETECTED Final   Parainfluenza Virus 1 NOT DETECTED NOT DETECTED Final   Parainfluenza Virus 2 NOT DETECTED NOT DETECTED Final   Parainfluenza Virus 3 NOT DETECTED NOT DETECTED Final   Parainfluenza Virus 4 NOT  DETECTED NOT DETECTED Final   Respiratory Syncytial Virus NOT DETECTED NOT DETECTED Final   Bordetella pertussis NOT DETECTED NOT DETECTED Final   Bordetella Parapertussis NOT DETECTED NOT DETECTED Final   Chlamydophila pneumoniae NOT DETECTED NOT DETECTED Final   Mycoplasma pneumoniae NOT DETECTED NOT DETECTED Final    Comment: Performed at [facility], [address], [place], [place] [address]         Radiology Studies: No results found.      Scheduled Meds:  vitamin C  500 mg Oral Daily   guaiFENesin  600 mg Oral BID   hydrochlorothiazide  25 mg Oral Daily   ipratropium-albuterol  3 mL Nebulization Q6H   methylPREDNISolone (SOLU-MEDROL) injection  40 mg Intravenous Q12H   mometasone-formoterol  2 puff Inhalation BID   montelukast  10 mg Oral QHS   pantoprazole  40 mg Oral Daily   sildenafil  20 mg Oral TID   sodium chloride  flush  3 mL Intravenous Q12H   Continuous Infusions:  sodium chloride     azithromycin (ZITHROMAX) 500 MG IVPB (Vial-Mate [employer])     levofloxacin (LEVAQUIN) IV 750 mg (12/11/20 1556)    Assessment & Plan:   Principal Problem:   Acute asthma exacerbation Active Problems:   COPD with acute bronchitis (HCC)   Chronic respiratory failure (HCC)   Essential hypertension   GERD (gastroesophageal reflux disease)   Patient is a 40 year old with a history of chronic persistent asthma/COPD with chronic respiratory failure who presents to the ER for evaluation of worsening shortness of breath compared to his baseline.         Acute asthma exacerbation/COPD with acute bronchitis Patient has a history of chronic persistent asthma and is on steroid therapy. He also has a history of COPD with chronic respiratory failure who presents for evaluation of worsening shortness of breath from his baseline associated with a cough productive of yellow phlegm as well as diffuse wheezes. 10/30 Respiratory panel positive for rhinovirus still wheezing and requiring 2Lnc with ambulation/rest. Continue IV steroids Continue Levaquin I-S and flutter valves       Hypertension Continue HCTZ       Genella Rife Continue PPI     DVT prophylaxis: scd Code Status:full Family Communication: None at bedside Disposition Plan: Back home Status is: Inpatient  Remains inpatient appropriate because: Requiring IV treatment, still wheezy.  Possible DC in a.m.          LOS: 2 days   Time spent: 35 minutes with more than 50% on COC    Lynn Ito, MD [facility] Pager [phone]  If 7PM-7AM, please contact night-coverage 12/12/2020, 1:39 PM

## 2020-12-12 NOTE — Plan of Care (Signed)
Continuing with plan of care. 

## 2020-12-13 DIAGNOSIS — J440 Chronic obstructive pulmonary disease with acute lower respiratory infection: Secondary | ICD-10-CM | POA: Diagnosis not present

## 2020-12-13 DIAGNOSIS — I10 Essential (primary) hypertension: Secondary | ICD-10-CM | POA: Diagnosis not present

## 2020-12-13 DIAGNOSIS — J4541 Moderate persistent asthma with (acute) exacerbation: Secondary | ICD-10-CM | POA: Diagnosis not present

## 2020-12-13 DIAGNOSIS — J9611 Chronic respiratory failure with hypoxia: Secondary | ICD-10-CM

## 2020-12-13 MED ORDER — ASCORBIC ACID 500 MG PO TABS: 500.0000 mg | ORAL_TABLET | Freq: Every day | ORAL | 0 refills | Status: AC

## 2020-12-13 MED ORDER — GUAIFENESIN-DM 100-10 MG/5ML PO SYRP: 5.0000 mL | ORAL_SOLUTION | ORAL | 0 refills | Status: AC | PRN

## 2020-12-13 MED ORDER — LEVOFLOXACIN 750 MG PO TABS: 750.0000 mg | ORAL_TABLET | Freq: Every day | ORAL | 0 refills | Status: AC

## 2020-12-13 MED ORDER — PREDNISONE 20 MG PO TABS: 40.0000 mg | ORAL_TABLET | Freq: Every day | ORAL | 0 refills | Status: AC

## 2020-12-13 MED ORDER — GUAIFENESIN ER 600 MG PO TB12: 600.0000 mg | ORAL_TABLET | Freq: Two times a day (BID) | ORAL | 0 refills | Status: AC

## 2020-12-13 NOTE — TOC CM/SW Note (Signed)
Patient has orders to discharge home today. Chart reviewed. PCP is Lydia Guiles, MD. On room air. No wounds. No TOC needs identified. CSW signing off.  Charlynn Court, CSW [phone]

## 2020-12-13 NOTE — Care Management Important Message (Signed)
Important Message  Patient Details  Name: Joseph [name] MRN: [id number] Date of Birth: 1980-09-23   Medicare Important Message Given:  No  Patient discharged prior to arrival to unit to deliver concurrent Medicare IM.   Johnell Comings 12/13/2020, 1:24 PM

## 2020-12-13 NOTE — Discharge Summary (Signed)
Joseph [name] FHL:[id number] DOB: 16-Dec-1980 DOA: 12/10/2020  PCP: Lydia Guiles, MD  Admit date: 12/10/2020 Discharge date: 12/13/2020  Admitted From: Home Disposition: Home  Recommendations for Outpatient Follow-up:  Follow up with PCP in 1 week Please obtain BMP/CBC in one week      Discharge Condition:Stable CODE STATUS: Home Diet recommendation: Heart Healthy  Brief/Interim Summary: Per HTD:Joseph [name] is a 40 y.o. male with medical history significant for asthma/COPD with chronic respiratory failure on 2 L of oxygen continuous, history of marijuana use who presents to the ER for evaluation of shortness of breath 1 day prior to his admission.  Patient was positive for marijuana.  He was admitted for acute asthma exacerbation with acute bronchitis.  He initially required 2 L of oxygen which was weaned down to room air.   Acute asthma exacerbation/COPD with acute bronchitis Patient has a history of chronic persistent asthma and is on steroid therapy. He also has a history of COPD with chronic respiratory failure on 2 L as needed at home.   Respiratory panel was positive for rhinovirus  Patient was started on antibiotics and IV steroids  Today on room air, personally ambulated patient with pulse ox satting 91 to 92% on room air with ambulation  Encouraged to stop marijuana use Continue I-S and flutter valve at home Continue steroid p.o. taper on discharge    Hypertension Continue home meds continue home meds       Gerd Continue PPI     Discharge Diagnoses:  Principal Problem:   Acute asthma exacerbation Active Problems:   COPD with acute bronchitis (HCC)   Chronic respiratory failure (HCC)   Essential hypertension   GERD (gastroesophageal reflux disease)    Discharge Instructions  Discharge Instructions     Diet - low sodium heart healthy   Complete by: As directed    Discharge instructions   Complete by: As directed    F/u with your pcp this  week Stop using marijuana Use the incentive spirometer 10x/hr   Increase activity slowly   Complete by: As directed       Allergies as of 12/13/2020       Reactions   Penicillins Shortness Of Breath   Pork-derived Products Nausea Only   Banana Hives   Eggs Or Egg-derived Products Hives        Medication List     STOP taking these medications    azithromycin 250 MG tablet Commonly known as: ZITHROMAX       TAKE these medications    albuterol 108 (90 Base) MCG/ACT inhaler Commonly known as: VENTOLIN HFA Inhale 2 puffs into the lungs every 6 (six) hours as needed for wheezing or shortness of breath.   ascorbic acid 500 MG tablet Commonly known as: VITAMIN C Take 1 tablet (500 mg total) by mouth daily.   Dupixent 300 MG/2ML prefilled syringe Generic drug: dupilumab Inject 2 mLs into the skin every 14 (fourteen) days.   Fluticasone-Salmeterol 500-50 MCG/DOSE Aepb Commonly known as: ADVAIR Inhale 1 puff into the lungs every 12 (twelve) hours.   guaiFENesin 600 MG 12 hr tablet Commonly known as: MUCINEX Take 1 tablet (600 mg total) by mouth 2 (two) times daily for 7 days.   guaiFENesin-dextromethorphan 100-10 MG/5ML syrup Commonly known as: ROBITUSSIN DM Take 5 mLs by mouth every 4 (four) hours as needed for up to 7 days for cough.   hydrochlorothiazide 25 MG tablet Commonly known as: HYDRODIURIL Take 25 mg by mouth daily.   ipratropium-albuterol  0.5-2.5 (3) MG/3ML Soln Commonly known as: DUONEB Take 3 mLs by nebulization every 6 (six) hours as needed.   levofloxacin 750 MG tablet Commonly known as: LEVAQUIN Take 1 tablet (750 mg total) by mouth daily for 2 days.   montelukast 10 MG tablet Commonly known as: SINGULAIR Take 10 mg by mouth at bedtime.   omeprazole 20 MG capsule Commonly known as: PRILOSEC Take 20 mg by mouth daily.   predniSONE 20 MG tablet Commonly known as: DELTASONE Take 2 tablets (40 mg total) by mouth daily with breakfast for  5 days. What changed:  medication strength how much to take how to take this when to take this additional instructions   sildenafil 20 MG tablet Commonly known as: REVATIO Take 20 mg by mouth 3 (three) times daily.   Tiotropium Bromide Monohydrate 2.5 MCG/ACT Aers Inhale 2 puffs into the lungs daily.   triamcinolone ointment 0.5 % Commonly known as: KENALOG Apply 1 application topically 2 (two) times daily.        Follow-up Information     Lydia Guiles, MD Follow up in 1 week(s).   Specialty: Family Medicine Contact information: [address] [place] [address] [phone]                Allergies  Allergen Reactions   Penicillins Shortness Of Breath   Pork-Derived Products Nausea Only   Banana Hives   Eggs Or Egg-Derived Products Hives    Consultations:    Procedures/Studies: DG Chest 2 View  Result Date: 12/10/2020 CLINICAL DATA:  Worsening SOB x 2 weeks. Wears 3l/ [place] home o2 due to COPD. Hx of asthma, COPD. Ex smoker quit 2 months ago. EXAM: CHEST - 2 VIEW COMPARISON:  11/28/2020 FINDINGS: Lungs are clear. Heart size and mediastinal contours are within normal limits. No effusion.  No pneumothorax. Multiple old rib fracture deformities. IMPRESSION: No acute cardiopulmonary disease. Electronically Signed   By: Corlis Leak M.D.   On: 12/10/2020 09:11   DG Chest Portable 1 View  Result Date: 11/28/2020 CLINICAL DATA:  Asthma exacerbation, evaluate for infiltrate, pneumothorax EXAM: PORTABLE CHEST 1 VIEW COMPARISON:  Chest radiograph 08/01/2020 FINDINGS: The cardiomediastinal silhouette is stable. The upper lobes are hyperlucent suggesting underlying COPD. There is no focal consolidation or pulmonary edema. There is no pleural effusion or pneumothorax. Multiple remote right-sided rib fractures are again seen. There is no evidence of acute osseous abnormality. IMPRESSION: 1. Hyperlucency in the upper lungs suggests underlying obstructive pulmonary  disease. 2. No evidence of acute cardiopulmonary process. Electronically Signed   By: Lesia Hausen M.D.   On: 11/28/2020 12:10      Subjective: Feels better.  No chest pain or shortness of breath.  Discharge Exam: Vitals:   12/13/20 0803 12/13/20 0835  BP:  (!) 147/94  Pulse: 93 99  Resp: 18 16  Temp:  98.6 F (37 C)  SpO2: 93% 92%   Vitals:   12/13/20 0143 12/13/20 0606 12/13/20 0803 12/13/20 0835  BP:  (!) 131/94  (!) 147/94  Pulse:  94 93 99  Resp:  16 18 16   Temp:  98.2 F (36.8 C)  98.6 F (37 C)  TempSrc:  Oral  Oral  SpO2: 97% 90% 93% 92%  Weight:      Height:        General: Pt is alert, awake, not in acute distress Cardiovascular: RRR, S1/S2 +, no rubs, no gallops Respiratory: CTA bilaterally,no wheezing Abdominal: Soft, NT, ND, bowel sounds +  Extremities: no edema    The results of significant diagnostics from this hospitalization (including imaging, microbiology, ancillary and laboratory) are listed below for reference.     Microbiology: Recent Results (from the past 240 hour(s))  Blood culture (single)     Status: None (Preliminary result)   Collection Time: 12/10/20 12:07 PM   Specimen: BLOOD  Result Value Ref Range Status   Specimen Description BLOOD LEFT [facility]  Final   Special Requests   Final    BOTTLES DRAWN AEROBIC AND ANAEROBIC Blood Culture adequate volume   Culture   Final    NO GROWTH 3 DAYS Performed at [facility], [address], [place], [place] [address]    Report Status PENDING  Incomplete  Resp Panel by RT-PCR (Flu A&B, Covid) Nasopharyngeal Swab     Status: None   Collection Time: 12/10/20  2:41 PM   Specimen: Nasopharyngeal Swab; Nasopharyngeal(NP) swabs in vial transport medium  Result Value Ref Range Status   SARS Coronavirus 2 by RT PCR NEGATIVE NEGATIVE Final    Comment: (NOTE) SARS-CoV-2 target nucleic acids are NOT DETECTED.  The SARS-CoV-2 RNA is generally detectable in upper respiratory specimens during  the acute phase of infection. The lowest concentration of SARS-CoV-2 viral copies this assay can detect is 138 copies/mL. A negative result does not preclude SARS-Cov-2 infection and should not be used as the sole basis for treatment or other patient management decisions. A negative result may occur with  improper specimen collection/handling, submission of specimen other than nasopharyngeal swab, presence of viral mutation(s) within the areas targeted by this assay, and inadequate number of viral copies(<138 copies/mL). A negative result must be combined with clinical observations, patient history, and epidemiological information. The expected result is Negative.  Fact Sheet for Patients:  BloggerCourse.com  Fact Sheet for Healthcare Providers:  SeriousBroker.it  This test is no t yet approved or cleared by the [place] FDA and  has been authorized for detection and/or diagnosis of SARS-CoV-2 by FDA under an Emergency Use Authorization (EUA). This EUA will remain  in effect (meaning this test can be used) for the duration of the COVID-19 declaration under Section 564(b)(1) of the Act, 21 U.S.C.section 360bbb-3(b)(1), unless the authorization is terminated  or revoked sooner.       Influenza A by PCR NEGATIVE NEGATIVE Final   Influenza B by PCR NEGATIVE NEGATIVE Final    Comment: (NOTE) The Xpert Xpress SARS-CoV-2/FLU/RSV plus assay is intended as an aid in the diagnosis of influenza from Nasopharyngeal swab specimens and should not be used as a sole basis for treatment. Nasal washings and aspirates are unacceptable for Xpert Xpress SARS-CoV-2/FLU/RSV testing.  Fact Sheet for Patients: BloggerCourse.com  Fact Sheet for Healthcare Providers: SeriousBroker.it  This test is not yet approved or cleared by the [place] FDA and has been authorized for detection and/or  diagnosis of SARS-CoV-2 by FDA under an Emergency Use Authorization (EUA). This EUA will remain in effect (meaning this test can be used) for the duration of the COVID-19 declaration under Section 564(b)(1) of the Act, 21 U.S.C. section 360bbb-3(b)(1), unless the authorization is terminated or revoked.  Performed at [facility], [address] [place], [place] [address]   Respiratory (~20 pathogens) panel by PCR     Status: Abnormal   Collection Time: 12/11/20 12:35 PM   Specimen: Nasopharyngeal Swab; Respiratory  Result Value Ref Range Status   Adenovirus NOT DETECTED NOT DETECTED Final   Coronavirus 229E NOT DETECTED NOT  DETECTED Final    Comment: (NOTE) The Coronavirus on the Respiratory Panel, DOES NOT test for the novel  Coronavirus (2019 nCoV)    Coronavirus HKU1 NOT DETECTED NOT DETECTED Final   Coronavirus NL63 NOT DETECTED NOT DETECTED Final   Coronavirus OC43 NOT DETECTED NOT DETECTED Final   Metapneumovirus NOT DETECTED NOT DETECTED Final   Rhinovirus / Enterovirus DETECTED (A) NOT DETECTED Final   Influenza A NOT DETECTED NOT DETECTED Final   Influenza B NOT DETECTED NOT DETECTED Final   Parainfluenza Virus 1 NOT DETECTED NOT DETECTED Final   Parainfluenza Virus 2 NOT DETECTED NOT DETECTED Final   Parainfluenza Virus 3 NOT DETECTED NOT DETECTED Final   Parainfluenza Virus 4 NOT DETECTED NOT DETECTED Final   Respiratory Syncytial Virus NOT DETECTED NOT DETECTED Final   Bordetella pertussis NOT DETECTED NOT DETECTED Final   Bordetella Parapertussis NOT DETECTED NOT DETECTED Final   Chlamydophila pneumoniae NOT DETECTED NOT DETECTED Final   Mycoplasma pneumoniae NOT DETECTED NOT DETECTED Final    Comment: Performed at [facility], [address] [place], [place] [address]     Labs: BNP (last 3 results) Recent Labs    12/10/20 0849  BNP 33.2   Basic Metabolic Panel: Recent Labs  Lab 12/10/20 0849 12/11/20 0434  NA 138 137  K 3.5 4.6  CL  93* 101  CO2 33* 34*  GLUCOSE 74 121*  BUN 22* 24*  CREATININE 0.83 0.79  CALCIUM 8.9 8.5*   Liver Function Tests: Recent Labs  Lab 12/10/20 0849  AST 21  ALT 18  ALKPHOS 62  BILITOT 1.1  PROT 7.6  ALBUMIN 3.8   No results for input(s): LIPASE, AMYLASE in the last 168 hours. No results for input(s): AMMONIA in the last 168 hours. CBC: Recent Labs  Lab 12/10/20 0849 12/11/20 0434  WBC 30.6* 26.5*  NEUTROABS 23.4*  --   HGB 14.6 13.3  HCT 44.6 39.9  MCV 83.5 83.5  PLT 416* 370   Cardiac Enzymes: No results for input(s): CKTOTAL, CKMB, CKMBINDEX, TROPONINI in the last 168 hours. BNP: Invalid input(s): POCBNP CBG: No results for input(s): GLUCAP in the last 168 hours. D-Dimer No results for input(s): DDIMER in the last 72 hours. Hgb A1c No results for input(s): HGBA1C in the last 72 hours. Lipid Profile No results for input(s): CHOL, HDL, LDLCALC, TRIG, CHOLHDL, LDLDIRECT in the last 72 hours. Thyroid function studies No results for input(s): TSH, T4TOTAL, T3FREE, THYROIDAB in the last 72 hours.  Invalid input(s): FREET3 Anemia work up No results for input(s): VITAMINB12, FOLATE, FERRITIN, TIBC, IRON, RETICCTPCT in the last 72 hours. Urinalysis    Component Value Date/Time   COLORURINE YELLOW (A) 12/05/2017 0823   APPEARANCEUR CLEAR (A) 12/05/2017 0823   LABSPEC 1.016 12/05/2017 0823   PHURINE 7.0 12/05/2017 0823   GLUCOSEU NEGATIVE 12/05/2017 0823   HGBUR NEGATIVE 12/05/2017 0823   BILIRUBINUR NEGATIVE 12/05/2017 0823   KETONESUR NEGATIVE 12/05/2017 0823   PROTEINUR NEGATIVE 12/05/2017 0823   NITRITE NEGATIVE 12/05/2017 0823   LEUKOCYTESUR NEGATIVE 12/05/2017 0823   Sepsis Labs Invalid input(s): PROCALCITONIN,  WBC,  LACTICIDVEN Microbiology Recent Results (from the past 240 hour(s))  Blood culture (single)     Status: None (Preliminary result)   Collection Time: 12/10/20 12:07 PM   Specimen: BLOOD  Result Value Ref Range Status   Specimen  Description BLOOD LEFT [facility]  Final   Special Requests   Final    BOTTLES DRAWN AEROBIC AND ANAEROBIC  Blood Culture adequate volume   Culture   Final    NO GROWTH 3 DAYS Performed at [facility], [address] [place], [place] [address]    Report Status PENDING  Incomplete  Resp Panel by RT-PCR (Flu A&B, Covid) Nasopharyngeal Swab     Status: None   Collection Time: 12/10/20  2:41 PM   Specimen: Nasopharyngeal Swab; Nasopharyngeal(NP) swabs in vial transport medium  Result Value Ref Range Status   SARS Coronavirus 2 by RT PCR NEGATIVE NEGATIVE Final    Comment: (NOTE) SARS-CoV-2 target nucleic acids are NOT DETECTED.  The SARS-CoV-2 RNA is generally detectable in upper respiratory specimens during the acute phase of infection. The lowest concentration of SARS-CoV-2 viral copies this assay can detect is 138 copies/mL. A negative result does not preclude SARS-Cov-2 infection and should not be used as the sole basis for treatment or other patient management decisions. A negative result may occur with  improper specimen collection/handling, submission of specimen other than nasopharyngeal swab, presence of viral mutation(s) within the areas targeted by this assay, and inadequate number of viral copies(<138 copies/mL). A negative result must be combined with clinical observations, patient history, and epidemiological information. The expected result is Negative.  Fact Sheet for Patients:  BloggerCourse.com  Fact Sheet for Healthcare Providers:  SeriousBroker.it  This test is no t yet approved or cleared by the [place] FDA and  has been authorized for detection and/or diagnosis of SARS-CoV-2 by FDA under an Emergency Use Authorization (EUA). This EUA will remain  in effect (meaning this test can be used) for the duration of the COVID-19 declaration under Section 564(b)(1) of the Act, 21 U.S.C.section 360bbb-3(b)(1),  unless the authorization is terminated  or revoked sooner.       Influenza A by PCR NEGATIVE NEGATIVE Final   Influenza B by PCR NEGATIVE NEGATIVE Final    Comment: (NOTE) The Xpert Xpress SARS-CoV-2/FLU/RSV plus assay is intended as an aid in the diagnosis of influenza from Nasopharyngeal swab specimens and should not be used as a sole basis for treatment. Nasal washings and aspirates are unacceptable for Xpert Xpress SARS-CoV-2/FLU/RSV testing.  Fact Sheet for Patients: BloggerCourse.com  Fact Sheet for Healthcare Providers: SeriousBroker.it  This test is not yet approved or cleared by the [place] FDA and has been authorized for detection and/or diagnosis of SARS-CoV-2 by FDA under an Emergency Use Authorization (EUA). This EUA will remain in effect (meaning this test can be used) for the duration of the COVID-19 declaration under Section 564(b)(1) of the Act, 21 U.S.C. section 360bbb-3(b)(1), unless the authorization is terminated or revoked.  Performed at [facility], [address] [place], [place] [address]   Respiratory (~20 pathogens) panel by PCR     Status: Abnormal   Collection Time: 12/11/20 12:35 PM   Specimen: Nasopharyngeal Swab; Respiratory  Result Value Ref Range Status   Adenovirus NOT DETECTED NOT DETECTED Final   Coronavirus 229E NOT DETECTED NOT DETECTED Final    Comment: (NOTE) The Coronavirus on the Respiratory Panel, DOES NOT test for the novel  Coronavirus (2019 nCoV)    Coronavirus HKU1 NOT DETECTED NOT DETECTED Final   Coronavirus NL63 NOT DETECTED NOT DETECTED Final   Coronavirus OC43 NOT DETECTED NOT DETECTED Final   Metapneumovirus NOT DETECTED NOT DETECTED Final   Rhinovirus / Enterovirus DETECTED (A) NOT DETECTED Final   Influenza A NOT DETECTED NOT DETECTED Final   Influenza B NOT DETECTED NOT DETECTED Final   Parainfluenza Virus  1 NOT DETECTED NOT DETECTED Final    Parainfluenza Virus 2 NOT DETECTED NOT DETECTED Final   Parainfluenza Virus 3 NOT DETECTED NOT DETECTED Final   Parainfluenza Virus 4 NOT DETECTED NOT DETECTED Final   Respiratory Syncytial Virus NOT DETECTED NOT DETECTED Final   Bordetella pertussis NOT DETECTED NOT DETECTED Final   Bordetella Parapertussis NOT DETECTED NOT DETECTED Final   Chlamydophila pneumoniae NOT DETECTED NOT DETECTED Final   Mycoplasma pneumoniae NOT DETECTED NOT DETECTED Final    Comment: Performed at [facility], [address] [place], [place] [address]     Time coordinating discharge: Over 30 minutes  SIGNED:   Lynn Ito, MD  [facility] 12/13/2020, 9:28 AM Pager   If 7PM-7AM, please contact night-coverage [email or web address] Password [email or web address]

## 2020-12-15 LAB — CULTURE, BLOOD (SINGLE)
Culture: NO GROWTH
Special Requests: ADEQUATE

## 2021-01-28 ENCOUNTER — Emergency Department
Admission: EM | Admit: 2021-01-28 | Discharge: 2021-01-28 | Disposition: A | Discharge status: Home / Self Care | Payer: No Typology Code available for payment source | Attending: Emergency Medicine | Admitting: Emergency Medicine

## 2021-01-28 ENCOUNTER — Emergency Department: Payer: No Typology Code available for payment source

## 2021-01-28 ENCOUNTER — Encounter: Payer: Self-pay | Admitting: Emergency Medicine

## 2021-01-28 ENCOUNTER — Other Ambulatory Visit: Payer: Self-pay

## 2021-01-28 DIAGNOSIS — Z7952 Long term (current) use of systemic steroids: Secondary | ICD-10-CM | POA: Insufficient documentation

## 2021-01-28 DIAGNOSIS — M25532 Pain in left wrist: Secondary | ICD-10-CM | POA: Diagnosis not present

## 2021-01-28 DIAGNOSIS — S20211A Contusion of right front wall of thorax, initial encounter: Secondary | ICD-10-CM | POA: Diagnosis not present

## 2021-01-28 DIAGNOSIS — Y92410 Unspecified street and highway as the place of occurrence of the external cause: Secondary | ICD-10-CM | POA: Insufficient documentation

## 2021-01-28 DIAGNOSIS — J449 Chronic obstructive pulmonary disease, unspecified: Secondary | ICD-10-CM | POA: Insufficient documentation

## 2021-01-28 DIAGNOSIS — Z79899 Other long term (current) drug therapy: Secondary | ICD-10-CM | POA: Diagnosis not present

## 2021-01-28 DIAGNOSIS — S59911A Unspecified injury of right forearm, initial encounter: Secondary | ICD-10-CM | POA: Diagnosis present

## 2021-01-28 DIAGNOSIS — J45909 Unspecified asthma, uncomplicated: Secondary | ICD-10-CM | POA: Insufficient documentation

## 2021-01-28 DIAGNOSIS — F17200 Nicotine dependence, unspecified, uncomplicated: Secondary | ICD-10-CM | POA: Insufficient documentation

## 2021-01-28 DIAGNOSIS — S5011XA Contusion of right forearm, initial encounter: Secondary | ICD-10-CM | POA: Insufficient documentation

## 2021-01-28 DIAGNOSIS — T07XXXA Unspecified multiple injuries, initial encounter: Secondary | ICD-10-CM

## 2021-01-28 DIAGNOSIS — I10 Essential (primary) hypertension: Secondary | ICD-10-CM | POA: Insufficient documentation

## 2021-01-28 MED ORDER — HYDROCODONE-ACETAMINOPHEN 5-325 MG PO TABS: 1.0000 | ORAL_TABLET | Freq: Four times a day (QID) | ORAL | 0 refills | Status: DC | PRN

## 2021-01-28 NOTE — ED Notes (Signed)
RN to bedside. Pt in no acute distress. Pt has obvious expiratory wheezing but pt states this is normal for him. Pain in right arm and rights signed abdominal/rib pain.
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
## 2021-02-02 ENCOUNTER — Emergency Department: Payer: No Typology Code available for payment source

## 2021-02-02 ENCOUNTER — Emergency Department
Admission: EM | Admit: 2021-02-02 | Discharge: 2021-02-02 | Disposition: A | Discharge status: Home / Self Care | Payer: No Typology Code available for payment source | Attending: Emergency Medicine | Admitting: Emergency Medicine

## 2021-02-02 ENCOUNTER — Other Ambulatory Visit: Payer: Self-pay

## 2021-02-02 DIAGNOSIS — M5432 Sciatica, left side: Secondary | ICD-10-CM | POA: Diagnosis not present

## 2021-02-02 DIAGNOSIS — Y92410 Unspecified street and highway as the place of occurrence of the external cause: Secondary | ICD-10-CM | POA: Insufficient documentation

## 2021-02-02 DIAGNOSIS — F17200 Nicotine dependence, unspecified, uncomplicated: Secondary | ICD-10-CM | POA: Diagnosis not present

## 2021-02-02 DIAGNOSIS — Z79899 Other long term (current) drug therapy: Secondary | ICD-10-CM | POA: Insufficient documentation

## 2021-02-02 DIAGNOSIS — Z7951 Long term (current) use of inhaled steroids: Secondary | ICD-10-CM | POA: Insufficient documentation

## 2021-02-02 DIAGNOSIS — I10 Essential (primary) hypertension: Secondary | ICD-10-CM | POA: Diagnosis not present

## 2021-02-02 DIAGNOSIS — J449 Chronic obstructive pulmonary disease, unspecified: Secondary | ICD-10-CM | POA: Diagnosis not present

## 2021-02-02 DIAGNOSIS — J45909 Unspecified asthma, uncomplicated: Secondary | ICD-10-CM | POA: Diagnosis not present

## 2021-02-02 DIAGNOSIS — S3992XA Unspecified injury of lower back, initial encounter: Secondary | ICD-10-CM | POA: Diagnosis present

## 2021-02-02 DIAGNOSIS — S39012A Strain of muscle, fascia and tendon of lower back, initial encounter: Secondary | ICD-10-CM

## 2021-02-02 MED ORDER — CYCLOBENZAPRINE HCL 5 MG PO TABS: 5.0000 mg | ORAL_TABLET | Freq: Three times a day (TID) | ORAL | 0 refills | Status: DC | PRN
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
## 2021-02-02 NOTE — Discharge Instructions (Addendum)
Your exam and x-ray are consistent with degenerative disc disease and sciatica on the left side.  Take the prescription steroid as directed.  Take the muscle relaxant as needed.  Follow-up with your primary provider for ongoing symptoms.
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
## 2021-02-02 NOTE — ED Notes (Signed)
Pt stated to wear 3L of O2 at home. Pt was placed on O2 in triage due to his sats being 86% RA. Pt stated, "I could not bring my oxygen tank because I am hurting and it was a lot to carry." Pt is 93% 3L on O2.

## 2021-08-07 ENCOUNTER — Observation Stay (HOSPITAL_BASED_OUTPATIENT_CLINIC_OR_DEPARTMENT_OTHER)
Admit: 2021-08-07 | Discharge: 2021-08-07 | Disposition: A | Discharge status: Home / Self Care | Payer: Medicare Other | Attending: Internal Medicine | Admitting: Internal Medicine

## 2021-08-07 ENCOUNTER — Encounter: Payer: Self-pay | Admitting: Emergency Medicine

## 2021-08-07 ENCOUNTER — Emergency Department: Payer: Medicare Other

## 2021-08-07 ENCOUNTER — Other Ambulatory Visit: Payer: Self-pay

## 2021-08-07 ENCOUNTER — Observation Stay
Admission: EM | Admit: 2021-08-07 | Discharge: 2021-08-08 | Disposition: A | Discharge status: Home / Self Care | Payer: Medicare Other | Attending: Hospitalist | Admitting: Hospitalist

## 2021-08-07 DIAGNOSIS — Z79899 Other long term (current) drug therapy: Secondary | ICD-10-CM | POA: Diagnosis not present

## 2021-08-07 DIAGNOSIS — J45909 Unspecified asthma, uncomplicated: Secondary | ICD-10-CM | POA: Insufficient documentation

## 2021-08-07 DIAGNOSIS — J441 Chronic obstructive pulmonary disease with (acute) exacerbation: Secondary | ICD-10-CM | POA: Diagnosis not present

## 2021-08-07 DIAGNOSIS — R918 Other nonspecific abnormal finding of lung field: Secondary | ICD-10-CM | POA: Insufficient documentation

## 2021-08-07 DIAGNOSIS — I10 Essential (primary) hypertension: Secondary | ICD-10-CM | POA: Diagnosis not present

## 2021-08-07 DIAGNOSIS — R9431 Abnormal electrocardiogram [ECG] [EKG]: Secondary | ICD-10-CM | POA: Diagnosis not present

## 2021-08-07 DIAGNOSIS — I340 Nonrheumatic mitral (valve) insufficiency: Secondary | ICD-10-CM | POA: Insufficient documentation

## 2021-08-07 DIAGNOSIS — Z7951 Long term (current) use of inhaled steroids: Secondary | ICD-10-CM | POA: Insufficient documentation

## 2021-08-07 DIAGNOSIS — G8929 Other chronic pain: Secondary | ICD-10-CM | POA: Diagnosis not present

## 2021-08-07 DIAGNOSIS — J9611 Chronic respiratory failure with hypoxia: Secondary | ICD-10-CM | POA: Insufficient documentation

## 2021-08-07 DIAGNOSIS — D72829 Elevated white blood cell count, unspecified: Secondary | ICD-10-CM | POA: Diagnosis not present

## 2021-08-07 DIAGNOSIS — R058 Other specified cough: Secondary | ICD-10-CM | POA: Diagnosis not present

## 2021-08-07 DIAGNOSIS — M7989 Other specified soft tissue disorders: Secondary | ICD-10-CM | POA: Diagnosis not present

## 2021-08-07 DIAGNOSIS — M25471 Effusion, right ankle: Secondary | ICD-10-CM | POA: Diagnosis not present

## 2021-08-07 DIAGNOSIS — E669 Obesity, unspecified: Secondary | ICD-10-CM | POA: Insufficient documentation

## 2021-08-07 DIAGNOSIS — K219 Gastro-esophageal reflux disease without esophagitis: Secondary | ICD-10-CM | POA: Diagnosis not present

## 2021-08-07 DIAGNOSIS — F17200 Nicotine dependence, unspecified, uncomplicated: Secondary | ICD-10-CM | POA: Diagnosis not present

## 2021-08-07 DIAGNOSIS — M25472 Effusion, left ankle: Secondary | ICD-10-CM | POA: Insufficient documentation

## 2021-08-07 DIAGNOSIS — F1290 Cannabis use, unspecified, uncomplicated: Secondary | ICD-10-CM | POA: Diagnosis not present

## 2021-08-07 DIAGNOSIS — R600 Localized edema: Secondary | ICD-10-CM | POA: Insufficient documentation

## 2021-08-07 DIAGNOSIS — R0609 Other forms of dyspnea: Secondary | ICD-10-CM

## 2021-08-07 DIAGNOSIS — J45901 Unspecified asthma with (acute) exacerbation: Secondary | ICD-10-CM | POA: Insufficient documentation

## 2021-08-07 DIAGNOSIS — Z6831 Body mass index (BMI) 31.0-31.9, adult: Secondary | ICD-10-CM | POA: Insufficient documentation

## 2021-08-07 DIAGNOSIS — R61 Generalized hyperhidrosis: Secondary | ICD-10-CM | POA: Insufficient documentation

## 2021-08-07 DIAGNOSIS — Z9981 Dependence on supplemental oxygen: Secondary | ICD-10-CM | POA: Diagnosis not present

## 2021-08-07 DIAGNOSIS — R0602 Shortness of breath: Secondary | ICD-10-CM | POA: Diagnosis present

## 2021-08-07 DIAGNOSIS — R000 Tachycardia, unspecified: Secondary | ICD-10-CM | POA: Insufficient documentation

## 2021-08-07 DIAGNOSIS — M8448XS Pathological fracture, other site, sequela: Secondary | ICD-10-CM | POA: Insufficient documentation

## 2021-08-07 DIAGNOSIS — Z7952 Long term (current) use of systemic steroids: Secondary | ICD-10-CM | POA: Diagnosis not present

## 2021-08-07 DIAGNOSIS — Z8249 Family history of ischemic heart disease and other diseases of the circulatory system: Secondary | ICD-10-CM | POA: Diagnosis not present

## 2021-08-07 LAB — RESPIRATORY PANEL BY PCR

## 2021-08-07 LAB — BASIC METABOLIC PANEL
Anion gap: 3 — ABNORMAL LOW (ref 5–15)
BUN: 20 mg/dL (ref 6–20)
CO2: 33 mmol/L — ABNORMAL HIGH (ref 22–32)
Calcium: 8.4 mg/dL — ABNORMAL LOW (ref 8.9–10.3)
Chloride: 107 mmol/L (ref 98–111)
Creatinine, Ser: 0.83 mg/dL (ref 0.61–1.24)
GFR, Estimated: >60 mL/min (ref >60)
Glucose, Bld: 87 mg/dL (ref 70–99)
Potassium: 4 mmol/L (ref 3.5–5.1)
Sodium: 143 mmol/L (ref 135–145)

## 2021-08-07 LAB — CBC
HCT: 47.1 % (ref 39.0–52.0)
Hemoglobin: 14.7 g/dL (ref 13.0–17.0)
MCH: 25.7 pg — ABNORMAL LOW (ref 26.0–34.0)
MCHC: 31.2 g/dL (ref 30.0–36.0)
MCV: 82.3 fL (ref 80.0–100.0)
Platelets: 384 10*3/uL (ref 150–400)
RBC: 5.72 MIL/uL (ref 4.22–5.81)
RDW: 17.8 % — ABNORMAL HIGH (ref 11.5–15.5)
WBC: 24.5 10*3/uL — ABNORMAL HIGH (ref 4.0–10.5)
nRBC: 0 % (ref 0.0–0.2)

## 2021-08-07 LAB — ECHOCARDIOGRAM COMPLETE
AR max vel: 3.05 cm2
AV Peak grad: 3.7 mmHg
Ao pk vel: 0.96 m/s
Area-P 1/2: 8.07 cm2
Calc EF: 55.4 %
Height: 65 in
S' Lateral: 3.35 cm
Single Plane A2C EF: 53.1 %
Single Plane A4C EF: 58.6 %
Weight: 3040 oz

## 2021-08-07 LAB — LACTIC ACID, PLASMA
Lactic Acid, Venous: 1.2 mmol/L (ref 0.5–1.9)
Lactic Acid, Venous: 1.3 mmol/L (ref 0.5–1.9)

## 2021-08-07 LAB — PROCALCITONIN: Procalcitonin: <0.1 ng/mL

## 2021-08-07 LAB — TROPONIN I (HIGH SENSITIVITY)
Troponin I (High Sensitivity): 13 ng/L (ref <18)
Troponin I (High Sensitivity): 10 ng/L (ref <18)

## 2021-08-07 LAB — BRAIN NATRIURETIC PEPTIDE: B Natriuretic Peptide: 23.2 pg/mL (ref 0.0–100.0)

## 2021-08-07 LAB — D-DIMER, QUANTITATIVE: D-Dimer, Quant: 0.45 ug/mL-FEU (ref 0.00–0.50)

## 2021-08-07 MED ORDER — CEFEPIME HCL 2 G IV SOLR
2.0000 g | Freq: Three times a day (TID) | INTRAVENOUS | Status: DC
Start: 2021-08-08 — End: 2021-08-08
  Administered 2021-08-08: 2 g via INTRAVENOUS
  Filled 2021-08-07: qty 12.5
  Filled 2021-08-07: qty 2

## 2021-08-07 MED ORDER — IPRATROPIUM-ALBUTEROL 0.5-2.5 (3) MG/3ML IN SOLN
3.0000 mL | Freq: Four times a day (QID) | RESPIRATORY_TRACT | Status: DC | PRN
  Administered 2021-08-08: 3 mL via RESPIRATORY_TRACT

## 2021-08-07 MED ORDER — FUROSEMIDE 10 MG/ML IJ SOLN
40.0000 mg | Freq: Once | INTRAMUSCULAR | Status: AC
  Administered 2021-08-07: 40 mg via INTRAVENOUS
  Filled 2021-08-07: qty 4

## 2021-08-07 MED ORDER — IPRATROPIUM-ALBUTEROL 0.5-2.5 (3) MG/3ML IN SOLN
3.0000 mL | Freq: Once | RESPIRATORY_TRACT | Status: AC
  Administered 2021-08-07: 3 mL via RESPIRATORY_TRACT
  Filled 2021-08-07: qty 3

## 2021-08-07 MED ORDER — HYDROCODONE-ACETAMINOPHEN 5-325 MG PO TABS
1.0000 | ORAL_TABLET | Freq: Four times a day (QID) | ORAL | Status: DC | PRN
  Administered 2021-08-07: 1 via ORAL
  Filled 2021-08-07: qty 1

## 2021-08-07 MED ORDER — SODIUM CHLORIDE 0.9 % IV SOLN
2.0000 g | Freq: Once | INTRAVENOUS | Status: AC
  Administered 2021-08-07: 2 g via INTRAVENOUS
  Filled 2021-08-07: qty 20

## 2021-08-07 MED ORDER — ALBUTEROL SULFATE (2.5 MG/3ML) 0.083% IN NEBU: 2.5000 mg | INHALATION_SOLUTION | Freq: Four times a day (QID) | RESPIRATORY_TRACT | Status: DC | PRN

## 2021-08-07 MED ORDER — MAGNESIUM SULFATE 2 GM/50ML IV SOLN
2.0000 g | Freq: Once | INTRAVENOUS | Status: AC
  Administered 2021-08-07: 2 g via INTRAVENOUS
  Filled 2021-08-07: qty 50

## 2021-08-07 MED ORDER — PANTOPRAZOLE SODIUM 40 MG PO TBEC
40.0000 mg | DELAYED_RELEASE_TABLET | Freq: Every day | ORAL | Status: DC
  Administered 2021-08-07 – 2021-08-08 (×2): 40 mg via ORAL
  Filled 2021-08-07 (×2): qty 1

## 2021-08-07 MED ORDER — SODIUM CHLORIDE 0.9 % IV SOLN
500.0000 mg | Freq: Once | INTRAVENOUS | Status: AC
Start: 2021-08-07 — End: 2021-08-07
  Administered 2021-08-07: 500 mg via INTRAVENOUS
  Filled 2021-08-07: qty 5

## 2021-08-07 MED ORDER — MONTELUKAST SODIUM 10 MG PO TABS
10.0000 mg | ORAL_TABLET | Freq: Every day | ORAL | Status: DC
  Administered 2021-08-07: 10 mg via ORAL
  Filled 2021-08-07: qty 1

## 2021-08-07 MED ORDER — MOMETASONE FURO-FORMOTEROL FUM 200-5 MCG/ACT IN AERO
2.0000 | INHALATION_SPRAY | Freq: Two times a day (BID) | RESPIRATORY_TRACT | Status: DC
  Administered 2021-08-07 – 2021-08-08 (×2): 2 via RESPIRATORY_TRACT
  Filled 2021-08-07: qty 8.8

## 2021-08-07 MED ORDER — HYDROCHLOROTHIAZIDE 25 MG PO TABS
25.0000 mg | ORAL_TABLET | Freq: Every day | ORAL | Status: DC
Start: 2021-08-07 — End: 2021-08-08
  Administered 2021-08-07 – 2021-08-08 (×2): 25 mg via ORAL
  Filled 2021-08-07 (×2): qty 1

## 2021-08-07 MED ORDER — SODIUM CHLORIDE 0.9 % IV BOLUS
1000.0000 mL | Freq: Once | INTRAVENOUS | Status: AC
  Administered 2021-08-07: 1000 mL via INTRAVENOUS

## 2021-08-07 MED ORDER — PREDNISONE 20 MG PO TABS
60.0000 mg | ORAL_TABLET | Freq: Once | ORAL | Status: AC
  Administered 2021-08-07: 60 mg via ORAL
  Filled 2021-08-07: qty 3

## 2021-08-07 MED ORDER — PREDNISONE 20 MG PO TABS
60.0000 mg | ORAL_TABLET | Freq: Every day | ORAL | Status: DC
  Administered 2021-08-08: 60 mg via ORAL
  Filled 2021-08-07: qty 3

## 2021-08-07 MED ORDER — IPRATROPIUM-ALBUTEROL 0.5-2.5 (3) MG/3ML IN SOLN
3.0000 mL | Freq: Three times a day (TID) | RESPIRATORY_TRACT | Status: DC
  Filled 2021-08-07: qty 3

## 2021-08-07 MED ORDER — RIVAROXABAN 10 MG PO TABS
10.0000 mg | ORAL_TABLET | Freq: Every day | ORAL | Status: DC
  Administered 2021-08-07 – 2021-08-08 (×2): 10 mg via ORAL
  Filled 2021-08-07 (×2): qty 1

## 2021-08-07 MED ORDER — CYCLOBENZAPRINE HCL 10 MG PO TABS
5.0000 mg | ORAL_TABLET | Freq: Three times a day (TID) | ORAL | Status: DC | PRN
  Administered 2021-08-07: 5 mg via ORAL
  Filled 2021-08-07: qty 1

## 2021-08-07 MED ORDER — IPRATROPIUM-ALBUTEROL 0.5-2.5 (3) MG/3ML IN SOLN
3.0000 mL | RESPIRATORY_TRACT | Status: DC
  Administered 2021-08-07 (×2): 3 mL via RESPIRATORY_TRACT
  Filled 2021-08-07 (×2): qty 3

## 2021-08-07 NOTE — ED Triage Notes (Signed)
Pt comes pov with generalized swelling-hands, feet, groin with shob worsening over the past few days. Lungs sound wet, pt having trouble walking around due to increased shob. Denies any known heart hx.

## 2021-08-07 NOTE — H&P (Addendum)
History and Physical    [name] [id number]:[id number] DOB: 10-10-1980 DOA: 08/07/2021 DOS: the patient was seen and examined on 08/07/2021 PCP: Lydia Guiles, MD  Patient coming from: Home  Chief Complaint:  Chief Complaint  Patient presents with   Shortness of Breath   HPI: Joseph [name] is a 41 y.o. male with medical history significant of asthma/COPD, chronic respiratory failure on 2 L oxygen continuously, history of marijuana use, hypertension, chronic pain and admitted for progressively worsening shortness of breath and lower extremity edema that began 2 days ago.  Patient notes 2 days ago showed with shortness of breath, woke up with it, prior to that felt like himself and fine.  He also noticed that time he had swelling in his ankles.  Swelling his ankles that occurred in the past but usually resolves on its own but this time it remained persistent.  He also notes coughing up sputum, shortness of breath with exertion.  No shortness of breath at rest.  Left sided chest pain that was 2 days ago, only noticed with cough or breathing too hard. Aching feeling.  Denies any exertional component.  Night sweats, no fevers, chills, abdominal pain, nausea vomiting, diarrhea, bloody stool, syncope or presyncope, potation's.No known sick contacts  Smoking, not too often, marijuana and no longer cigarettes. Reports no alcohol use. Denies any other drug use.  Review of Systems: As mentioned in the history of present illness. All other systems reviewed and are negative. Past Medical History:  Diagnosis Date   Asthma    Eczema    History reviewed. No pertinent surgical history. Social History:  reports that he has been smoking. He has never used smokeless tobacco. He reports that he does not drink alcohol. No history on file for drug use.  Allergies  Allergen Reactions   Penicillins Shortness Of Breath   Pork-Derived Products Nausea Only   Banana Hives   Eggs Or Egg-Derived  Products Hives    Family History  Problem Relation Age of Onset   Hypertension Mother     Prior to Admission medications   Medication Sig Start Date End Date Taking? Authorizing Provider  albuterol (PROVENTIL HFA;VENTOLIN HFA) 108 (90 Base) MCG/ACT inhaler Inhale 2 puffs into the lungs every 6 (six) hours as needed for wheezing or shortness of breath.    [provider]  cyclobenzaprine (FLEXERIL) 5 MG tablet Take 1 tablet (5 mg total) by mouth 3 (three) times daily as needed. 02/02/21   Menshew, Charlesetta Ivory, [name]  dupilumab (DUPIXENT) 300 MG/2ML prefilled syringe Inject 2 mLs into the skin every 14 (fourteen) days. 07/16/20   [provider]  Fluticasone-Salmeterol (ADVAIR) 500-50 MCG/DOSE AEPB Inhale 1 puff into the lungs every 12 (twelve) hours. 07/21/14   [provider]  hydrochlorothiazide (HYDRODIURIL) 25 MG tablet Take 25 mg by mouth daily. 09/24/20   [provider]  HYDROcodone-acetaminophen (NORCO/VICODIN) 5-325 MG tablet Take 1 tablet by mouth every 6 (six) hours as needed for moderate pain. 01/28/21 01/28/22  Tommi Rumps, [name]  ipratropium-albuterol (DUONEB) 0.5-2.5 (3) MG/3ML SOLN Take 3 mLs by nebulization every 6 (six) hours as needed. Patient not taking: Reported on 12/10/2020 03/27/17   [name], [name]  montelukast (SINGULAIR) 10 MG tablet Take 10 mg by mouth at bedtime.    [provider]  omeprazole (PRILOSEC) 20 MG capsule Take 20 mg by mouth daily. 06/27/16   [provider]  sildenafil (REVATIO) 20 MG tablet Take 20 mg by mouth 3 (  three) times daily. 09/13/20   [provider]  Tiotropium Bromide Monohydrate 2.5 MCG/ACT AERS Inhale 2 puffs into the lungs daily. 04/24/17   [provider]  triamcinolone ointment (KENALOG) 0.5 % Apply 1 application topically 2 (two) times daily. 09/17/20   [provider]    Physical Exam: Vitals:   08/07/21 1039 08/07/21 1040 08/07/21 1225  BP: (!)  161/113  (!) 155/105  Pulse: (!) 109  94  Resp: (!) 21  (!) 24  Temp: 98 F (36.7 C)  98.3 F (36.8 C)  TempSrc: Oral  Oral  SpO2: 90%  96%  Weight:  86.2 kg   Height:  5\' 5"  (1.651 m)    Physical Exam Vitals and nursing note reviewed.  Constitutional:      General: He is not in acute distress.    Appearance: He is not diaphoretic.  HENT:     Head: Normocephalic and atraumatic.  Eyes:     Pupils: Pupils are equal, round, and reactive to light.  Cardiovascular:     Rate and Rhythm: Tachycardia present.     Pulses: Normal pulses.     Heart sounds: No murmur heard. Pulmonary:     Effort: Pulmonary effort is normal. No respiratory distress.     Breath sounds: Examination of the right-upper field reveals wheezing. Examination of the left-upper field reveals wheezing. Examination of the right-middle field reveals wheezing. Examination of the left-middle field reveals wheezing. Examination of the right-lower field reveals wheezing. Examination of the left-lower field reveals wheezing. Wheezing (expiratory) present. No rales.  Chest:     Chest wall: No edema.  Abdominal:     General: Abdomen is flat. There is no distension.     Palpations: Abdomen is soft.     Tenderness: There is no abdominal tenderness. There is no rebound.  Musculoskeletal:     Right lower leg: Edema (+1 up to mid-shin) present.     Left lower leg: Edema (+1 to mid-shin) present.  Skin:    General: Skin is warm and dry.     Capillary Refill: Capillary refill takes less than 2 seconds.  Neurological:     Mental Status: He is alert and oriented to person, place, and time. Mental status is at baseline.  Psychiatric:        Mood and Affect: Mood normal.     Data Reviewed:     Latest Ref Rng & Units 08/07/2021   10:41 AM 12/11/2020    4:34 AM 12/10/2020    8:49 AM  CBC  WBC 4.0 - 10.5 K/uL 24.5  26.5  30.6   Hemoglobin 13.0 - 17.0 g/dL 12/12/2020  66.4  40.3   Hematocrit 39.0 - 52.0 % 47.1  39.9  44.6    Platelets 150 - 400 K/uL 384  370  416       Latest Ref Rng & Units 08/07/2021   10:41 AM 12/11/2020    4:34 AM 12/10/2020    8:49 AM  BMP  Glucose 70 - 99 mg/dL 87  12/12/2020  74   BUN 6 - 20 mg/dL 20  24  22    Creatinine 0.61 - 1.24 mg/dL 259   5.63   Sodium 135 - 145 mmol/L 143  137  138   Potassium 3.5 - 5.1 mmol/L 4.0  4.6  3.5   Chloride 98 - 111 mmol/L 107  101  93   CO2 22 - 32 mmol/L 33  34  33   Calcium  8.9 - 10.3 mg/dL 8.4  8.5  8.9    BNP 93.2 Pro-Cal less than 0.10 Lactate 1.2 High-sensitivity troponin 13, repeat pending  ECG notable for sinus tachycardia, late R wave transition,  Assessment and Plan:  Joseph [name] is a 41 y.o. male with medical history significant of asthma/COPD, chronic respiratory failure on 2 L oxygen continuously, history of marijuana use, hypertension, chronic pain and admitted for progressively worsening shortness of breath 2/2 COPD exacerbation and lower extremity edema that began 2 days ago.  SOB Hx of COPD/Asthma Respiratory failure on 2 L of oxygen continuously Patient reports a history of chronic respiratory failure on 2 L of oxygen continuously and chronic steroid use (pred 7.5 mg) 2/2 asthma and COPD overlap that he follows with pulmonary at [facility]. PFTS 10/2020 with FEV1/FVC  Pre 32%. Two days ago noted shortness of breath when he woke up with, has progressively worsening until he presented to the hospital today.  He also noted new lower extremity edema (see below).  Trigger unknown.  Patient reports night sweats but otherwise no other systemic signs to suggest infection, no sick contacts.  Chest x-ray with possible new right lower lobe infiltrate, Pro-Cal is less than 0.10, has a chronically elevated white blood cell count that is probably associated to chronic steroid use.  We will continue antibiotics at this time and work-up for viral infection as well.  Given the swelling in his extremities he will also be worked up for PE,  and heart failure. -prednisone 60 mg oral given in ED, continue  -Received ceftriaxone and azithromycin in ED, continue antibiotics with cefepime (pseudomonas coverage)  -duo-nebs q4h -Received 2 g IV magnesium sulfate in ED -D-dimer, RVP, sputum culture and echo are pending -home medications  -Dupilumab is due 6/27  -continue fluticasone-salmeterol inhaler (formulary alternative)  -hold Tiotropium (does not like side-effects)  -continue Montelukast 10 mg once daily  LE edema Patient reports new onset bilateral lower extremity med that began at the same time of the shortness of breath.  He has no history of heart failure.  BNP 23.2 in the setting of obesity.  EKG unchanged from previously.  His prior echo was in 2019 with normal LVEF, no evidence of pulmonary hypertension.  Unable to see outside echoes in Care Everywhere.  Suspect his lower extremity most likely from some component of pulmonary hypertension so we will order an echo again. -Lasix IV 40 given in the ED -echocardiogram pending   HTN Continue hydrochlorothiazide 25 mg  ED Takes sildenafil PRN for ED (not pHTN)  GERD -continue PPI  Diet: Regular DVT ppx: rivaroxaban Advance Care Planning:   Code Status: Prior full  Consults: None  Family Communication: Patient's family not at bedside, they are aware he is hospitalized  Severity of Illness: The appropriate patient status for this patient is OBSERVATION. Observation status is judged to be reasonable and necessary in order to provide the required intensity of service to ensure the patient's safety. The patient's presenting symptoms, physical exam findings, and initial radiographic and laboratory data in the context of their medical condition is felt to place them at decreased risk for further clinical deterioration. Furthermore, it is anticipated that the patient will be medically stable for discharge from the hospital within 2 midnights of admission.   Author: Charolotte Eke, MD 08/07/2021 1:18 PM  For on call review [email or web address].

## 2021-08-07 NOTE — ED Notes (Signed)
Pt on arrival, respiratory distress, wheezing and increased SOB. Also having some bilateral leg swelling. Pt take O2 PRN, pt has a hx of asthma and steroid dependent. Pt 90% on RA, pt placed on 3L [place].

## 2021-08-08 ENCOUNTER — Encounter: Payer: Self-pay | Admitting: Internal Medicine

## 2021-08-08 DIAGNOSIS — J441 Chronic obstructive pulmonary disease with (acute) exacerbation: Secondary | ICD-10-CM | POA: Diagnosis not present

## 2021-08-08 LAB — COMPREHENSIVE METABOLIC PANEL
ALT: 18 U/L (ref 0–44)
AST: 19 U/L (ref 15–41)
Albumin: 2.6 g/dL — ABNORMAL LOW (ref 3.5–5.0)
Alkaline Phosphatase: 48 U/L (ref 38–126)
Anion gap: 4 — ABNORMAL LOW (ref 5–15)
BUN: 18 mg/dL (ref 6–20)
CO2: 37 mmol/L — ABNORMAL HIGH (ref 22–32)
Calcium: 8.1 mg/dL — ABNORMAL LOW (ref 8.9–10.3)
Chloride: 100 mmol/L (ref 98–111)
Creatinine, Ser: 0.75 mg/dL (ref 0.61–1.24)
GFR, Estimated: >60 mL/min (ref >60)
Glucose, Bld: 95 mg/dL (ref 70–99)
Potassium: 3.7 mmol/L (ref 3.5–5.1)
Sodium: 141 mmol/L (ref 135–145)
Total Bilirubin: 0.5 mg/dL (ref 0.3–1.2)
Total Protein: 5.1 g/dL — ABNORMAL LOW (ref 6.5–8.1)

## 2021-08-08 LAB — CBC
HCT: 43.9 % (ref 39.0–52.0)
Hemoglobin: 13.5 g/dL (ref 13.0–17.0)
MCH: 25.3 pg — ABNORMAL LOW (ref 26.0–34.0)
MCHC: 30.8 g/dL (ref 30.0–36.0)
MCV: 82.4 fL (ref 80.0–100.0)
Platelets: 359 10*3/uL (ref 150–400)
RBC: 5.33 MIL/uL (ref 4.22–5.81)
RDW: 17.2 % — ABNORMAL HIGH (ref 11.5–15.5)
WBC: 24.6 10*3/uL — ABNORMAL HIGH (ref 4.0–10.5)
nRBC: 0 % (ref 0.0–0.2)

## 2021-08-08 MED ORDER — LEVOFLOXACIN 750 MG PO TABS: 750.0000 mg | ORAL_TABLET | Freq: Every day | ORAL | 0 refills | Status: AC

## 2021-08-08 MED ORDER — PREDNISONE 20 MG PO TABS
ORAL_TABLET | ORAL | 0 refills | Status: DC
Start: 2021-08-09 — End: 2021-08-24

## 2021-08-08 MED ORDER — IPRATROPIUM-ALBUTEROL 0.5-2.5 (3) MG/3ML IN SOLN
3.0000 mL | Freq: Three times a day (TID) | RESPIRATORY_TRACT | Status: DC
  Administered 2021-08-08: 3 mL via RESPIRATORY_TRACT
  Filled 2021-08-08: qty 3

## 2021-08-08 MED ORDER — IPRATROPIUM-ALBUTEROL 0.5-2.5 (3) MG/3ML IN SOLN: 3.0000 mL | Freq: Four times a day (QID) | RESPIRATORY_TRACT | Status: DC

## 2021-08-08 MED ORDER — LEVOFLOXACIN 750 MG PO TABS
750.0000 mg | ORAL_TABLET | ORAL | Status: DC
  Administered 2021-08-08: 750 mg via ORAL
  Filled 2021-08-08: qty 1

## 2021-08-08 MED ORDER — ALBUTEROL SULFATE (2.5 MG/3ML) 0.083% IN NEBU
2.5000 mg | INHALATION_SOLUTION | RESPIRATORY_TRACT | Status: DC | PRN
Start: 2021-08-08 — End: 2021-08-08

## 2021-08-08 MED ORDER — FUROSEMIDE 10 MG/ML IJ SOLN
40.0000 mg | Freq: Once | INTRAMUSCULAR | Status: AC
  Administered 2021-08-08: 40 mg via INTRAVENOUS
  Filled 2021-08-08: qty 4

## 2021-08-08 NOTE — Plan of Care (Signed)
Pt AAOx4, no pain. Plan for abx and mobility as tolerated. VS are stable. Bed is in lowest position, call light within reach. Will continue to monitor.

## 2021-08-08 NOTE — TOC Initial Note (Signed)
Transition of Care [facility]) - Initial/Assessment Note    Patient Details  Name: Joseph [name] MRN: [id number] Date of Birth: February 14, 1980  Transition of Care [facility]) CM/SW Contact:    Chapman Fitch, RN Phone Number: 08/08/2021, 2:07 PM  Clinical Narrative:                   Transition of Care [facility]) Screening Note   Patient Details  Name: Joseph [name] Date of Birth: Feb 15, 1980   Transition of Care [facility]) CM/SW Contact:    Chapman Fitch, RN Phone Number: 08/08/2021, 2:07 PM    Transition of Care Department [facility]) has reviewed patient and no TOC needs have been identified at this time. We will continue to monitor patient advancement through interdisciplinary progression rounds. If new patient transition needs arise, please place a TOC consult.  Discussed option of home health RN for COPD and edema.  Patient declines        Patient Goals and CMS Choice        Expected Discharge Plan and Services           Expected Discharge Date: 08/08/21                                    Prior Living Arrangements/Services                       Activities of Daily Living Home Assistive Devices/Equipment: Gilmer Mor (specify quad or straight) ADL Screening (condition at time of admission) Patient's cognitive ability adequate to safely complete daily activities?: Yes Is the patient deaf or have difficulty hearing?: No Does the patient have difficulty seeing, even when wearing glasses/contacts?: No Does the patient have difficulty concentrating, remembering, or making decisions?: No Patient able to express need for assistance with ADLs?: Yes Does the patient have difficulty dressing or bathing?: No Independently performs ADLs?: Yes (appropriate for developmental age) Does the patient have difficulty walking or climbing stairs?: No Weakness of Legs: None Weakness of Arms/Hands: None  Permission Sought/Granted                  Emotional Assessment               Admission diagnosis:  COPD exacerbation (HCC) [J44.1] Patient Active Problem List   Diagnosis Date Noted   COPD exacerbation (HCC) 08/07/2021   Acute asthma exacerbation 12/10/2020   COPD with acute bronchitis (HCC) 12/10/2020   Chronic respiratory failure (HCC) 12/10/2020   Essential hypertension 12/10/2020   GERD (gastroesophageal reflux disease) 12/10/2020   Sepsis (HCC) 12/05/2017   PCP:  Lydia Guiles, MD Pharmacy:   [facility] [phone] [place], [place] - [address] AT [facility] OF [address] [place] [address] [place] [address] Phone: [phone] Fax: [phone]  [id number] - [place], [place] - [address] AT [facility] OF [address] [facility] [address] [place] [address] Phone: [phone] Fax: [phone]     Social Determinants of Health (SDOH) Interventions    Readmission Risk Interventions     No data to display

## 2021-08-12 LAB — CULTURE, BLOOD (ROUTINE X 2)
Culture: NO GROWTH
Culture: NO GROWTH
Special Requests: ADEQUATE

## 2021-08-21 ENCOUNTER — Inpatient Hospital Stay
Admission: EM | Admit: 2021-08-21 | Discharge: 2021-08-24 | DRG: 191 | Disposition: A | Discharge status: Home / Self Care | Payer: Medicare Other | Attending: Internal Medicine | Admitting: Internal Medicine

## 2021-08-21 ENCOUNTER — Other Ambulatory Visit: Payer: Self-pay

## 2021-08-21 ENCOUNTER — Emergency Department: Payer: Medicare Other

## 2021-08-21 DIAGNOSIS — F17200 Nicotine dependence, unspecified, uncomplicated: Secondary | ICD-10-CM | POA: Diagnosis present

## 2021-08-21 DIAGNOSIS — J9611 Chronic respiratory failure with hypoxia: Secondary | ICD-10-CM | POA: Diagnosis present

## 2021-08-21 DIAGNOSIS — Z8249 Family history of ischemic heart disease and other diseases of the circulatory system: Secondary | ICD-10-CM | POA: Diagnosis not present

## 2021-08-21 DIAGNOSIS — Z79899 Other long term (current) drug therapy: Secondary | ICD-10-CM | POA: Diagnosis not present

## 2021-08-21 DIAGNOSIS — Z7952 Long term (current) use of systemic steroids: Secondary | ICD-10-CM | POA: Diagnosis not present

## 2021-08-21 DIAGNOSIS — Z91014 Allergy to mammalian meats: Secondary | ICD-10-CM | POA: Diagnosis not present

## 2021-08-21 DIAGNOSIS — J441 Chronic obstructive pulmonary disease with (acute) exacerbation: Secondary | ICD-10-CM | POA: Diagnosis not present

## 2021-08-21 DIAGNOSIS — J9621 Acute and chronic respiratory failure with hypoxia: Principal | ICD-10-CM

## 2021-08-21 DIAGNOSIS — Z91018 Allergy to other foods: Secondary | ICD-10-CM | POA: Diagnosis not present

## 2021-08-21 DIAGNOSIS — Z9981 Dependence on supplemental oxygen: Secondary | ICD-10-CM

## 2021-08-21 DIAGNOSIS — G4733 Obstructive sleep apnea (adult) (pediatric): Secondary | ICD-10-CM | POA: Diagnosis present

## 2021-08-21 DIAGNOSIS — L309 Dermatitis, unspecified: Secondary | ICD-10-CM | POA: Diagnosis present

## 2021-08-21 DIAGNOSIS — K219 Gastro-esophageal reflux disease without esophagitis: Secondary | ICD-10-CM | POA: Diagnosis present

## 2021-08-21 DIAGNOSIS — Z91012 Allergy to eggs: Secondary | ICD-10-CM | POA: Diagnosis not present

## 2021-08-21 DIAGNOSIS — Z880 Allergy status to penicillin: Secondary | ICD-10-CM | POA: Diagnosis not present

## 2021-08-21 DIAGNOSIS — Z7951 Long term (current) use of inhaled steroids: Secondary | ICD-10-CM

## 2021-08-21 DIAGNOSIS — Z20822 Contact with and (suspected) exposure to covid-19: Secondary | ICD-10-CM | POA: Diagnosis present

## 2021-08-21 DIAGNOSIS — R0602 Shortness of breath: Secondary | ICD-10-CM | POA: Diagnosis not present

## 2021-08-21 DIAGNOSIS — I10 Essential (primary) hypertension: Secondary | ICD-10-CM | POA: Diagnosis present

## 2021-08-21 HISTORY — DX: Essential (primary) hypertension: I10

## 2021-08-21 HISTORY — DX: Chronic obstructive pulmonary disease, unspecified: J44.9

## 2021-08-21 LAB — BASIC METABOLIC PANEL
Anion gap: 6 (ref 5–15)
BUN: 22 mg/dL — ABNORMAL HIGH (ref 6–20)
CO2: 32 mmol/L (ref 22–32)
Calcium: 8.6 mg/dL — ABNORMAL LOW (ref 8.9–10.3)
Chloride: 104 mmol/L (ref 98–111)
Creatinine, Ser: 0.98 mg/dL (ref 0.61–1.24)
GFR, Estimated: >60 mL/min (ref >60)
Glucose, Bld: 95 mg/dL (ref 70–99)
Potassium: 4 mmol/L (ref 3.5–5.1)
Sodium: 142 mmol/L (ref 135–145)

## 2021-08-21 LAB — TROPONIN I (HIGH SENSITIVITY)
Troponin I (High Sensitivity): 16 ng/L (ref <18)
Troponin I (High Sensitivity): 13 ng/L (ref <18)

## 2021-08-21 LAB — MAGNESIUM: Magnesium: 2.1 mg/dL (ref 1.7–2.4)

## 2021-08-21 LAB — CBC
HCT: 47.3 % (ref 39.0–52.0)
Hemoglobin: 14.7 g/dL (ref 13.0–17.0)
MCH: 25.7 pg — ABNORMAL LOW (ref 26.0–34.0)
MCHC: 31.1 g/dL (ref 30.0–36.0)
MCV: 82.8 fL (ref 80.0–100.0)
Platelets: 375 10*3/uL (ref 150–400)
RBC: 5.71 MIL/uL (ref 4.22–5.81)
RDW: 18.5 % — ABNORMAL HIGH (ref 11.5–15.5)
WBC: 19.2 10*3/uL — ABNORMAL HIGH (ref 4.0–10.5)
nRBC: 0 % (ref 0.0–0.2)

## 2021-08-21 LAB — RESP PANEL BY RT-PCR (FLU A&B, COVID) ARPGX2
Influenza A by PCR: NEGATIVE
Influenza B by PCR: NEGATIVE
SARS Coronavirus 2 by RT PCR: NEGATIVE

## 2021-08-21 LAB — BRAIN NATRIURETIC PEPTIDE: B Natriuretic Peptide: 12.3 pg/mL (ref 0.0–100.0)

## 2021-08-21 MED ORDER — SODIUM CHLORIDE 0.9 % IV SOLN: 250.0000 mL | INTRAVENOUS | Status: DC | PRN

## 2021-08-21 MED ORDER — MOMETASONE FURO-FORMOTEROL FUM 200-5 MCG/ACT IN AERO
2.0000 | INHALATION_SPRAY | Freq: Two times a day (BID) | RESPIRATORY_TRACT | Status: DC
  Administered 2021-08-21 – 2021-08-24 (×6): 2 via RESPIRATORY_TRACT
  Filled 2021-08-21: qty 8.8

## 2021-08-21 MED ORDER — ALBUTEROL SULFATE (2.5 MG/3ML) 0.083% IN NEBU
2.5000 mg | INHALATION_SOLUTION | Freq: Four times a day (QID) | RESPIRATORY_TRACT | Status: DC
  Administered 2021-08-21 – 2021-08-22 (×4): 2.5 mg via RESPIRATORY_TRACT
  Filled 2021-08-21 (×4): qty 3

## 2021-08-21 MED ORDER — IPRATROPIUM-ALBUTEROL 0.5-2.5 (3) MG/3ML IN SOLN
3.0000 mL | Freq: Once | RESPIRATORY_TRACT | Status: AC
  Administered 2021-08-21: 3 mL via RESPIRATORY_TRACT
  Filled 2021-08-21: qty 3

## 2021-08-21 MED ORDER — TIZANIDINE HCL 2 MG PO TABS
2.0000 mg | ORAL_TABLET | Freq: Three times a day (TID) | ORAL | Status: DC
  Administered 2021-08-21 – 2021-08-24 (×9): 2 mg via ORAL
  Filled 2021-08-21 (×11): qty 1

## 2021-08-21 MED ORDER — METHYLPREDNISOLONE SODIUM SUCC 125 MG IJ SOLR
80.0000 mg | INTRAMUSCULAR | Status: AC
  Administered 2021-08-21: 80 mg via INTRAVENOUS
  Filled 2021-08-21: qty 2

## 2021-08-21 MED ORDER — ACETAMINOPHEN 650 MG RE SUPP: 650.0000 mg | Freq: Four times a day (QID) | RECTAL | Status: DC | PRN

## 2021-08-21 MED ORDER — PREDNISONE 20 MG PO TABS
40.0000 mg | ORAL_TABLET | Freq: Every day | ORAL | Status: DC
  Administered 2021-08-22 – 2021-08-24 (×2): 40 mg via ORAL
  Filled 2021-08-21 (×2): qty 2

## 2021-08-21 MED ORDER — ACETAMINOPHEN 325 MG PO TABS
650.0000 mg | ORAL_TABLET | Freq: Four times a day (QID) | ORAL | Status: DC | PRN
  Administered 2021-08-22: 650 mg via ORAL
  Filled 2021-08-21: qty 2

## 2021-08-21 MED ORDER — ENOXAPARIN SODIUM 40 MG/0.4ML IJ SOSY
40.0000 mg | PREFILLED_SYRINGE | INTRAMUSCULAR | Status: DC
  Administered 2021-08-21 – 2021-08-22 (×2): 40 mg via SUBCUTANEOUS
  Filled 2021-08-21 (×3): qty 0.4

## 2021-08-21 MED ORDER — SODIUM CHLORIDE 0.9% FLUSH: 3.0000 mL | INTRAVENOUS | Status: DC | PRN

## 2021-08-21 MED ORDER — ONDANSETRON HCL 4 MG/2ML IJ SOLN: 4.0000 mg | Freq: Four times a day (QID) | INTRAMUSCULAR | Status: DC | PRN

## 2021-08-21 MED ORDER — HYDROCHLOROTHIAZIDE 25 MG PO TABS
25.0000 mg | ORAL_TABLET | Freq: Every day | ORAL | Status: DC
  Administered 2021-08-21 – 2021-08-22 (×2): 25 mg via ORAL
  Filled 2021-08-21 (×2): qty 1

## 2021-08-21 MED ORDER — ONDANSETRON HCL 4 MG PO TABS: 4.0000 mg | ORAL_TABLET | Freq: Four times a day (QID) | ORAL | Status: DC | PRN

## 2021-08-21 MED ORDER — MONTELUKAST SODIUM 10 MG PO TABS
10.0000 mg | ORAL_TABLET | Freq: Every day | ORAL | Status: DC
  Administered 2021-08-21 – 2021-08-23 (×3): 10 mg via ORAL
  Filled 2021-08-21 (×3): qty 1

## 2021-08-21 MED ORDER — PANTOPRAZOLE SODIUM 40 MG PO TBEC
40.0000 mg | DELAYED_RELEASE_TABLET | Freq: Every day | ORAL | Status: DC
  Administered 2021-08-21 – 2021-08-24 (×4): 40 mg via ORAL
  Filled 2021-08-21 (×4): qty 1

## 2021-08-21 MED ORDER — SODIUM CHLORIDE 0.9% FLUSH
3.0000 mL | Freq: Two times a day (BID) | INTRAVENOUS | Status: DC
  Administered 2021-08-21 – 2021-08-23 (×5): 3 mL via INTRAVENOUS

## 2021-08-21 MED ORDER — ALBUTEROL SULFATE (2.5 MG/3ML) 0.083% IN NEBU: 2.5000 mg | INHALATION_SOLUTION | RESPIRATORY_TRACT | Status: DC | PRN

## 2021-08-21 MED ORDER — MAGNESIUM SULFATE 2 GM/50ML IV SOLN
2.0000 g | Freq: Once | INTRAVENOUS | Status: AC
  Administered 2021-08-21: 2 g via INTRAVENOUS
  Filled 2021-08-21: qty 50

## 2021-08-21 MED ORDER — METHYLPREDNISOLONE SODIUM SUCC 125 MG IJ SOLR
125.0000 mg | Freq: Once | INTRAMUSCULAR | Status: AC
  Administered 2021-08-21: 125 mg via INTRAVENOUS
  Filled 2021-08-21: qty 2

## 2021-08-21 MED ORDER — ALBUTEROL SULFATE (2.5 MG/3ML) 0.083% IN NEBU: 2.5000 mg | INHALATION_SOLUTION | Freq: Four times a day (QID) | RESPIRATORY_TRACT | Status: DC | PRN

## 2021-08-21 NOTE — Assessment & Plan Note (Signed)
Patient with a history of COPD with chronic respiratory failure on 2 L of oxygen as needed who presents for evaluation of worsening shortness of breath from his baseline associated with wheezing and a dry cough. Place patient on scheduled and as needed bronchodilator therapy Place patient on systemic steroids Supportive care with antitussives Continue oxygen supplementation to maintain pulse oximetry greater than 92%

## 2021-08-21 NOTE — Assessment & Plan Note (Signed)
Stable Continue hydrochlorothiazide

## 2021-08-21 NOTE — ED Notes (Signed)
Rainbow sent to lab

## 2021-08-21 NOTE — H&P (Signed)
History and Physical    [name] [id number] DOB: 1980-09-12 DOA: 08/21/2021 DOS: the patient was seen and examined on 08/21/2021 PCP: Lydia Guiles, MD  Patient coming from: Home  Chief Complaint:  Chief Complaint  Patient presents with   Shortness of Breath   HPI: Joseph [name] is a 41 y.o. male with medical history significant for COPD with chronic respiratory failure on 2 L of oxygen as needed, history of marijuana use, hypertension who was recently discharged from the hospital on 08/08/21. He presents to the ER for evaluation of a 2-day history of worsening shortness of breath from his baseline associated with a dry cough and pleuritic chest pain.  He has wheezes that did not improve despite the use of his nebulizers.  Symptoms started after patient completed his recent prednisone taper. He denies having any fever, no chills, no nausea, no vomiting, no changes in his bowel habits, no urinary symptoms, no dizziness, no lightheadedness, no headache, no blurred vision no focal deficit.    Review of Systems: As mentioned in the history of present illness. All other systems reviewed and are negative. Past Medical History:  Diagnosis Date   Asthma    COPD (chronic obstructive pulmonary disease) (HCC)    Eczema    Hypertension    History reviewed. No pertinent surgical history. Social History:  reports that he has been smoking. He has never used smokeless tobacco. He reports that he does not drink alcohol. No history on file for drug use.  Allergies  Allergen Reactions   Penicillins Shortness Of Breath   Pork-Derived Products Nausea Only   Banana Hives   Eggs Or Egg-Derived Products Hives    Family History  Problem Relation Age of Onset   Hypertension Mother     Prior to Admission medications   Medication Sig Start Date End Date Taking? Authorizing Provider  albuterol (PROVENTIL HFA;VENTOLIN HFA) 108 (90 Base) MCG/ACT inhaler Inhale 2 puffs into the lungs  every 6 (six) hours as needed for wheezing or shortness of breath.    [provider]  albuterol (PROVENTIL) (2.5 MG/3ML) 0.083% nebulizer solution Take 2.5 mg by nebulization every 6 (six) hours as needed for wheezing or shortness of breath.    [provider]  dupilumab (DUPIXENT) 300 MG/2ML prefilled syringe Inject 300 mg into the skin every 14 (fourteen) days.    [provider]  fluticasone-salmeterol (ADVAIR HFA) 230-21 MCG/ACT inhaler Inhale 2 puffs into the lungs every 12 (twelve) hours.    [provider]  hydrochlorothiazide (HYDRODIURIL) 25 MG tablet Take 25 mg by mouth daily. 09/24/20   [provider]  ibuprofen (ADVIL) 800 MG tablet Take 800 mg by mouth 3 (three) times daily as needed for pain.    [provider]  montelukast (SINGULAIR) 10 MG tablet Take 10 mg by mouth at bedtime.    [provider]  omeprazole (PRILOSEC) 20 MG capsule Take 20 mg by mouth daily as needed (GERD symptoms).    [provider]  predniSONE (DELTASONE) 2.5 MG tablet Take 7.5 mg by mouth daily.    [provider]  predniSONE (DELTASONE) 20 MG tablet Take 60 mg (3 tablets) from 6/27 to 6/29, then 40 mg (2 tablets) from 6/30 to 7/2, then 20 mg (1 tablet) from 7/3 to 7/5, then back on your home prednisone 7.5 mg daily. 08/09/21   Darlin Priestly, MD  sildenafil (REVATIO) 20 MG tablet Take 60 mg by mouth daily as needed (ED symptoms).  [provider]  tiZANidine (ZANAFLEX) 2 MG tablet Take 2 mg by mouth 3 (three) times daily.    [provider]    Physical Exam: Vitals:   08/21/21 1112 08/21/21 1215 08/21/21 1230 08/21/21 1330  BP: (!) 164/124 (!) 180/120 (!) 164/120 (!) 169/109  Pulse: (!) 117 88 90 84  Resp: (!) 24 20 16 19   Temp: 98 F (36.7 C)     SpO2: 91% 99% 98% 100%  Weight:    86.2 kg   Physical Exam Vitals and nursing note reviewed.  Constitutional:      Appearance: He is obese.     Comments:  Cushingoid facies  HENT:     Head: Normocephalic and atraumatic.     Mouth/Throat:     Mouth: Mucous membranes are moist.  Eyes:     Pupils: Pupils are equal, round, and reactive to light.  Cardiovascular:     Rate and Rhythm: Tachycardia present.  Pulmonary:     Effort: Tachypnea present.     Breath sounds: Examination of the right-upper field reveals wheezing. Examination of the left-upper field reveals wheezing. Examination of the right-middle field reveals wheezing. Examination of the left-middle field reveals wheezing. Examination of the right-lower field reveals wheezing. Examination of the left-lower field reveals wheezing. Wheezing present.  Abdominal:     General: Bowel sounds are normal.     Palpations: Abdomen is soft.  Musculoskeletal:        General: Normal range of motion.     Cervical back: Normal range of motion and neck supple.  Skin:    General: Skin is warm and dry.  Neurological:     General: No focal deficit present.     Mental Status: He is alert.  Psychiatric:        Mood and Affect: Mood normal.        Behavior: Behavior normal.       Data Reviewed: Relevant notes from primary care and specialist visits, past discharge summaries as available in EHR, including Care Everywhere. Prior diagnostic testing as pertinent to current admission diagnoses Updated medications and problem lists for reconciliation ED course, including vitals, labs, imaging, treatment and response to treatment Triage notes, nursing and pharmacy notes and ED provider's notes Notable results as noted in HPI Troponin 16 >> 13, magnesium 2.1, BNP 12.3, sodium 142, potassium 4.0, chloride 10, bicarb 32, glucose 95 BUN 22, creatinine 0.98, calcium 8.6, white count 19.2, hemoglobin 14.7, hematocrit 47.3, platelet count 375 Respiratory viral panel is negative Chest x-ray reviewed by me shows no acute cardiopulmonary disease. Twelve-lead EKG reviewed by me shows sinus tachycardia There are no  new results to review at this time.  Assessment and Plan: * COPD with acute exacerbation (HCC) Patient with a history of COPD with chronic respiratory failure on 2 L of oxygen as needed who presents for evaluation of worsening shortness of breath from his baseline associated with wheezing and a dry cough. Place patient on scheduled and as needed bronchodilator therapy Place patient on systemic steroids Supportive care with antitussives Continue oxygen supplementation to maintain pulse oximetry greater than 92%  GERD (gastroesophageal reflux disease) Stable Continue PPI  Essential hypertension Stable Continue hydrochlorothiazide      Advance Care Planning:   Code Status: Full Code   Consults: None  Family Communication: Greater than 50% of time was spent discussing patient's condition and plan of care with him at the bedside.  All questions and concerns have been addressed.  He verbalizes  understanding and agrees with the plan.  Severity of Illness: The appropriate patient status for this patient is INPATIENT. Inpatient status is judged to be reasonable and necessary in order to provide the required intensity of service to ensure the patient's safety. The patient's presenting symptoms, physical exam findings, and initial radiographic and laboratory data in the context of their chronic comorbidities is felt to place them at high risk for further clinical deterioration. Furthermore, it is not anticipated that the patient will be medically stable for discharge from the hospital within 2 midnights of admission.   * I certify that at the point of admission it is my clinical judgment that the patient will require inpatient hospital care spanning beyond 2 midnights from the point of admission due to high intensity of service, high risk for further deterioration and high frequency of surveillance required.*  Author: Lucile Shutters, MD 08/21/2021 3:20 PM  For on call review [email or web address].

## 2021-08-21 NOTE — Assessment & Plan Note (Signed)
Stable.  Continue PPI. 

## 2021-08-21 NOTE — ED Provider Notes (Signed)
[facility] Provider Note    Event Date/Time   First MD Initiated Contact with Patient 08/21/21 1119     (approximate)   History   Shortness of Breath   HPI  Joseph [name] is a 41 y.o. male with history of asthma COPD, chronically on 2 L of oxygen who presents with worsening shortness of breath over the last few days after he finished his most recent prednisone taper.  The patient is on 7.5 mg daily chronically.  He reports cough but no fever.  He does not have any chest pain.  However, he does report bilateral leg and face swelling.  He had this during his last hospitalization, it improved, and now has returned.    Physical Exam   Triage Vital Signs: ED Triage Vitals  Enc Vitals Group     BP 08/21/21 1112 (!) 164/124     Pulse Rate 08/21/21 1112 (!) 117     Resp 08/21/21 1112 (!) 24     Temp 08/21/21 1112 98 F (36.7 C)     Temp src --      SpO2 08/21/21 1112 91 %     Weight --      Height --      Head Circumference --      Peak Flow --      Pain Score 08/21/21 1111 5     Pain Loc --      Pain Edu? --      Excl. in GC? --     Most recent vital signs: Vitals:   08/21/21 1230 08/21/21 1330  BP: (!) 164/120 (!) 169/109  Pulse: 90 84  Resp: 16 19  Temp:    SpO2: 98% 100%     General: Alert and oriented, uncomfortable appearing but in no acute distress. CV:  Good peripheral perfusion.  Normal heart sounds. Resp:  Increased respiratory effort with accessory muscle use.  Diffuse wheezing bilaterally Abd:  No distention.  Other:  1+ bilateral lower extremity edema.   ED Results / Procedures / Treatments   Labs (all labs ordered are listed, but only abnormal results are displayed) Labs Reviewed  CBC - Abnormal; Notable for the following components:      Result Value   WBC 19.2 (*)    MCH 25.7 (*)    RDW 18.5 (*)    All other components within normal limits  BASIC METABOLIC PANEL - Abnormal; Notable for the following components:    BUN 22 (*)    Calcium 8.6 (*)    All other components within normal limits  RESP PANEL BY RT-PCR (FLU A&B, COVID) [id number]  BRAIN NATRIURETIC PEPTIDE  MAGNESIUM  TROPONIN I (HIGH SENSITIVITY)  TROPONIN I (HIGH SENSITIVITY)     EKG  ED ECG REPORT I, Dionne Bucy, the attending physician, personally viewed and interpreted this ECG.  Date: 08/21/2021 EKG Time: 1120 Rate: 106 Rhythm: Sinus tachycardia QRS Axis: Borderline right axis Intervals: normal ST/T Wave abnormalities: normal Narrative Interpretation: no evidence of acute ischemia    RADIOLOGY  Chest x-ray: I independently viewed and interpreted the images; there is no focal consolidation or edema  PROCEDURES:  Critical Care performed: Yes, see critical care procedure note(s)  .Critical Care  Performed by: Dionne Bucy, MD Authorized by: Dionne Bucy, MD   Critical care provider statement:    Critical care time (minutes):  30   Critical care was necessary to treat or prevent imminent or life-threatening deterioration of the following conditions:  Respiratory failure   Critical care was time spent personally by me on the following activities:  Development of treatment plan with patient or surrogate, discussions with consultants, evaluation of patient's response to treatment, examination of patient, ordering and review of laboratory studies, ordering and review of radiographic studies, ordering and performing treatments and interventions, pulse oximetry, re-evaluation of patient's condition, review of old charts and obtaining history from patient or surrogate   Care discussed with: admitting provider      MEDICATIONS ORDERED IN ED: Medications  magnesium sulfate IVPB 2 g 50 mL (0 g Intravenous Stopped 08/21/21 1216)  ipratropium-albuterol (DUONEB) 0.5-2.5 (3) MG/3ML nebulizer solution 3 mL (3 mLs Nebulization Given 08/21/21 1139)  ipratropium-albuterol (DUONEB) 0.5-2.5 (3) MG/3ML nebulizer solution  3 mL (3 mLs Nebulization Given 08/21/21 1138)  methylPREDNISolone sodium succinate (SOLU-MEDROL) 125 mg/2 mL injection 125 mg (125 mg Intravenous Given 08/21/21 1138)     IMPRESSION / MDM / ASSESSMENT AND PLAN / ED COURSE  I reviewed the triage vital signs and the nursing notes.  41 year old male with PMH as noted above presents with worsening shortness of breath, wheezing, and some edema.  I reviewed the past medical records.  The patient was most recently admitted last month.  Per the hospitalist discharge summary from 6/26 he was treated for asthma/COPD exacerbation.  Exam is significant for increased work of breathing.  Diffuse wheezing bilaterally, and mild peripheral edema.  Differential diagnosis includes, but is not limited to, asthma/COPD exacerbation, acute bronchitis, pneumonia, viral infection, new onset CHF, other cardiac cause.  Patient's presentation is most consistent with acute presentation with potential threat to life or bodily function.  We will obtain chest x-ray, lab work-up, give bronchodilators, steroid, IV magnesium, and reassess.  The patient is on the cardiac monitor to evaluate for evidence of arrhythmia and/or significant heart rate changes.  ----------------------------------------- 1:38 PM on 08/21/2021 -----------------------------------------  The patient remains with diffuse wheezing.  His respiratory status has improved somewhat although he is still requiring 4 L of O2.  Respiratory panel is negative.  CBC shows leukocytosis although this is chronic for the patient and likely due to his chronic steroid use.  Troponin and BNP are pending  Chest x-ray shows no focal consolidation or edema.  Given the continued increased oxygen requirement and symptoms the patient will need admission.  I consulted Dr. Joylene Igo from the hospitalist service; based on her discussion she agrees to admit the patient.   FINAL CLINICAL IMPRESSION(S) / ED DIAGNOSES   Final  diagnoses:  Acute on chronic respiratory failure with hypoxia (HCC)     Rx / DC Orders   ED Discharge Orders     None        Note:  This document was prepared using Dragon voice recognition software and may include unintentional dictation errors.    Dionne Bucy, MD 08/21/21 1339

## 2021-08-21 NOTE — ED Triage Notes (Addendum)
Pt comes with c/o right sided face swelling and bilateral leg swelling. Pt states this started July 1st. Pt stats hx of asthma and COPD. Pt states SOB.  Pt has labored breathing noted and accessory muscle. Pt wears 2L [place] at night. Pt states he has had to started wearing during the day due to the increased SOB.

## 2021-08-22 DIAGNOSIS — I10 Essential (primary) hypertension: Secondary | ICD-10-CM

## 2021-08-22 DIAGNOSIS — J441 Chronic obstructive pulmonary disease with (acute) exacerbation: Secondary | ICD-10-CM | POA: Diagnosis not present

## 2021-08-22 LAB — BASIC METABOLIC PANEL
Anion gap: 6 (ref 5–15)
BUN: 29 mg/dL — ABNORMAL HIGH (ref 6–20)
CO2: 33 mmol/L — ABNORMAL HIGH (ref 22–32)
Calcium: 8.6 mg/dL — ABNORMAL LOW (ref 8.9–10.3)
Chloride: 101 mmol/L (ref 98–111)
Creatinine, Ser: 1.09 mg/dL (ref 0.61–1.24)
GFR, Estimated: >60 mL/min (ref >60)
Glucose, Bld: 146 mg/dL — ABNORMAL HIGH (ref 70–99)
Potassium: 4.1 mmol/L (ref 3.5–5.1)
Sodium: 140 mmol/L (ref 135–145)

## 2021-08-22 LAB — CBC
HCT: 43.6 % (ref 39.0–52.0)
Hemoglobin: 13.7 g/dL (ref 13.0–17.0)
MCH: 25.6 pg — ABNORMAL LOW (ref 26.0–34.0)
MCHC: 31.4 g/dL (ref 30.0–36.0)
MCV: 81.5 fL (ref 80.0–100.0)
Platelets: 348 10*3/uL (ref 150–400)
RBC: 5.35 MIL/uL (ref 4.22–5.81)
RDW: 17.2 % — ABNORMAL HIGH (ref 11.5–15.5)
WBC: 18.3 10*3/uL — ABNORMAL HIGH (ref 4.0–10.5)
nRBC: 0 % (ref 0.0–0.2)

## 2021-08-22 MED ORDER — AMLODIPINE BESYLATE 5 MG PO TABS
5.0000 mg | ORAL_TABLET | Freq: Every day | ORAL | Status: DC
  Administered 2021-08-22 – 2021-08-24 (×3): 5 mg via ORAL
  Filled 2021-08-22 (×3): qty 1

## 2021-08-22 MED ORDER — OXYCODONE-ACETAMINOPHEN 5-325 MG PO TABS
1.0000 | ORAL_TABLET | ORAL | Status: DC | PRN
  Administered 2021-08-22 – 2021-08-24 (×6): 1 via ORAL
  Filled 2021-08-22 (×6): qty 1

## 2021-08-22 MED ORDER — TIOTROPIUM BROMIDE MONOHYDRATE 18 MCG IN CAPS
18.0000 ug | ORAL_CAPSULE | Freq: Every day | RESPIRATORY_TRACT | Status: DC
  Administered 2021-08-23 – 2021-08-24 (×2): 18 ug via RESPIRATORY_TRACT
  Filled 2021-08-22 (×2): qty 5

## 2021-08-22 NOTE — Progress Notes (Signed)
Admission profile updated. ?

## 2021-08-22 NOTE — Hospital Course (Signed)
Joseph [name] is a 41 y.o. male with medical history significant for COPD with chronic respiratory failure on 2 L of oxygen as needed, history of marijuana use, hypertension who was recently discharged from the hospital on 08/08/21. He presents to the ER for evaluation of a 2-day history of worsening shortness of breath from his baseline associated with a dry cough and pleuritic chest pain.  Patient has significant bronchospasm, was started on steroids.  Patient has normal troponin and BNP. Met patient wife 7/11, patient had a severe apnea and hypoxemia at nighttime.  Obtain overnight oximetry to qualify for trilogy.

## 2021-08-22 NOTE — Progress Notes (Signed)
  Progress Note   [name] DDU:[id number] DOB: 19-Apr-1980 DOA: 08/21/2021     1 DOS: the patient was seen and examined on 08/22/2021   Brief hospital course: Aaron Bostwick is a 41 y.o. male with medical history significant for COPD with chronic respiratory failure on 2 L of oxygen as needed, history of marijuana use, hypertension who was recently discharged from the hospital on 08/08/21. He presents to the ER for evaluation of a 2-day history of worsening shortness of breath from his baseline associated with a dry cough and pleuritic chest pain.  Patient has significant bronchospasm, was started on steroids.  Patient has normal troponin and BNP.  Assessment and Plan: COPD exacerbation. Patient has severe bronchospasm, I reviewed patient chest x-ray, no acute infection. Continue steroids, continue nebulization. Add Spiriva.  Essential hypertension. Blood pressure running high, add Norvasc     Subjective:  Patient has signal wheezing and shortness of breath.  Cough, nonproductive.  Physical Exam: Vitals:   08/22/21 0400 08/22/21 0600 08/22/21 0820 08/22/21 1221  BP: (!) 147/92 (!) 152/97 (!) 164/107 (!) 170/117  Pulse: 99 92 (!) 106 99  Resp: 20 20 18  (!) 21  Temp: 97.9 F (36.6 C)  98 F (36.7 C) 98 F (36.7 C)  TempSrc: Oral  Oral Oral  SpO2: 94% 99% 94% 95%  Weight:       General exam: Appears calm and comfortable  Respiratory system: Diffuse wheezing. Respiratory effort normal. Cardiovascular system: S1 & S2 heard, RRR. No JVD, murmurs, rubs, gallops or clicks. No pedal edema. Gastrointestinal system: Abdomen is nondistended, soft and nontender. No organomegaly or masses felt. Normal bowel sounds heard. Central nervous system: Alert and oriented. No focal neurological deficits. Extremities: Symmetric 5 x 5 power. Skin: No rashes, lesions or ulcers Psychiatry: Judgement and insight appear normal. Mood & affect appropriate.   Data Reviewed:    Family  Communication: none  Disposition: Status is: Inpatient Remains inpatient appropriate because: Severity of disease,.  Planned Discharge Destination: Home    Time spent: 25 minutes  Author: , MD 08/22/2021 2:46 PM  For on call review [email or web address].

## 2021-08-23 DIAGNOSIS — G4733 Obstructive sleep apnea (adult) (pediatric): Secondary | ICD-10-CM

## 2021-08-23 DIAGNOSIS — J441 Chronic obstructive pulmonary disease with (acute) exacerbation: Secondary | ICD-10-CM | POA: Diagnosis not present

## 2021-08-23 DIAGNOSIS — I10 Essential (primary) hypertension: Secondary | ICD-10-CM | POA: Diagnosis not present

## 2021-08-23 LAB — BLOOD GAS, ARTERIAL
Acid-Base Excess: 10.5 mmol/L — ABNORMAL HIGH (ref 0.0–2.0)
Bicarbonate: 36.7 mmol/L — ABNORMAL HIGH (ref 20.0–28.0)
FIO2: 28 %
O2 Content: 2 L/min
O2 Saturation: 100 %
Patient temperature: 37
pCO2 arterial: 54 mmHg — ABNORMAL HIGH (ref 32–48)
pH, Arterial: 7.44 (ref 7.35–7.45)
pO2, Arterial: 134 mmHg — ABNORMAL HIGH (ref 83–108)

## 2021-08-23 MED ORDER — ALBUTEROL SULFATE (2.5 MG/3ML) 0.083% IN NEBU
2.5000 mg | INHALATION_SOLUTION | Freq: Three times a day (TID) | RESPIRATORY_TRACT | Status: DC
  Administered 2021-08-23 – 2021-08-24 (×4): 2.5 mg via RESPIRATORY_TRACT
  Filled 2021-08-23 (×4): qty 3

## 2021-08-23 NOTE — Progress Notes (Signed)
  Progress Note   [name] AXE:[id number] DOB: 03-Sep-1980 DOA: 08/21/2021     2 DOS: the patient was seen and examined on 08/23/2021   Brief hospital course: Joseph [name] is a 41 y.o. male with medical history significant for COPD with chronic respiratory failure on 2 L of oxygen as needed, history of marijuana use, hypertension who was recently discharged from the hospital on 08/08/21. He presents to the ER for evaluation of a 2-day history of worsening shortness of breath from his baseline associated with a dry cough and pleuritic chest pain.  Patient has significant bronchospasm, was started on steroids.  Patient has normal troponin and BNP. Met patient wife 7/11, patient had a severe apnea and hypoxemia at nighttime.  Obtain overnight oximetry to qualify for trilogy.  Assessment and Plan: COPD exacerbation. Obstructive sleep apnea. Patient still has significant bronchospasm, but patient was wheezing chronically.  He has evidence of severe obstructive sleep apnea.  Will obtain overnight oximetry to qualify for noninvasive ventilation. Continue steroids. Reviewed echocardiogram performed last month, ejection fraction 55 to 60%, right ventricular systolic function appears to be normal. Therefore, there is no suspicion for severe pulmonary hypertension.  Essential hypertension. Continue Norvasc      Subjective:  Patient had a severe apnea in the nighttime and hypoxia.  Short of breath seem to be improving, still wheezing.  Minimal cough.  Physical Exam: Vitals:   08/22/21 2054 08/23/21 0449 08/23/21 0822 08/23/21 0852  BP: (!) 137/100 (!) 156/114  (!) 153/108  Pulse: 95 88 91 87  Resp: $Remo'20 20 18 20  'qaJTq$ Temp: 97.9 F (36.6 C) 97.6 F (36.4 C)    TempSrc:      SpO2: 93% 95% 96% 95%  Weight:       General exam: Appears calm and comfortable  Respiratory system: Wheezing. Respiratory effort normal. Cardiovascular system: S1 & S2 heard, RRR. No JVD, murmurs, rubs,  gallops or clicks. No pedal edema. Gastrointestinal system: Abdomen is nondistended, soft and nontender. No organomegaly or masses felt. Normal bowel sounds heard. Central nervous system: Alert and oriented. No focal neurological deficits. Extremities: Symmetric 5 x 5 power. Skin: No rashes, lesions or ulcers Psychiatry: Judgement and insight appear normal. Mood & affect appropriate.   Data Reviewed:  Reviewed lab results, echocardiogram results.  Family Communication: Wife updated.  Disposition: Status is: Inpatient Remains inpatient appropriate because: Severity of disease,  Planned Discharge Destination: Home    Time spent: 35 minutes  Author: Sharen Hones, MD 08/23/2021 11:48 AM  For on call review [email or web address].

## 2021-08-23 NOTE — TOC Initial Note (Signed)
Transition of Care [facility]) - Initial/Assessment Note    Patient Details  Name: Joseph [name] MRN: [id number] Date of Birth: 05-20-1980  Transition of [facility]) CM/SW Contact:    Caryn Section, RN Phone Number: 08/23/2021, 11:13 AM  Clinical Narrative:     Patient lives at home, and states his only concern is that he has been trying to get bipap/trilogy for "months" and has been unable.  RNCM spoke with care team, current plan is for overnight SATs tonight and evaluate for equipment needs at home.  RNCM will advise [employer] of needs prior to discharge.             Expected Discharge Plan: Home/Self Care Barriers to Discharge: Continued Medical Work up   Patient Goals and CMS Choice        Expected Discharge Plan and Services Expected Discharge Plan: Home/Self Care   Discharge Planning Services: CM Consult   Living arrangements for the past 2 months: Single Family Home                 DME Arranged:  (see notes)                    Prior Living Arrangements/Services Living arrangements for the past 2 months: Single Family Home   Patient language and need for interpreter reviewed:: Yes Do you feel safe going back to the place where you live?: Yes      Need for Family Participation in Patient Care: Yes (Comment) Care giver support system in place?: Yes (comment)   Criminal Activity/Legal Involvement Pertinent to Current Situation/Hospitalization: No - Comment as needed  Activities of Daily Living Home Assistive Devices/Equipment: Cane (specify quad or straight) ADL Screening (condition at time of admission) Patient's cognitive ability adequate to safely complete daily activities?: Yes Is the patient deaf or have difficulty hearing?: No Does the patient have difficulty seeing, even when wearing glasses/contacts?: No Does the patient have difficulty concentrating, remembering, or making decisions?: No Patient able to express need for assistance with ADLs?: Yes Does  the patient have difficulty dressing or bathing?: No Independently performs ADLs?: Yes (appropriate for developmental age) Does the patient have difficulty walking or climbing stairs?: No Weakness of Legs: None Weakness of Arms/Hands: None  Permission Sought/Granted Permission sought to share information with : Case Manager Permission granted to share information with : Yes, Verbal Permission Granted     Permission granted to share info w AGENCY: DME company if needed        Emotional Assessment Appearance:: Appears stated age Attitude/Demeanor/Rapport: Gracious, Engaged Affect (typically observed): Pleasant, Appropriate Orientation: : Oriented to Self, Oriented to Place, Oriented to  Time, Oriented to Situation Alcohol / Substance Use: Not Applicable Psych Involvement: No (comment)  Admission diagnosis:  COPD exacerbation (HCC) [J44.1] COPD with acute exacerbation (HCC) [J44.1] Acute on chronic respiratory failure with hypoxia (HCC) [J96.21] Patient Active Problem List   Diagnosis Date Noted   COPD with acute exacerbation (HCC) 08/21/2021   COPD exacerbation (HCC) 08/07/2021   Acute asthma exacerbation 12/10/2020   COPD with acute bronchitis (HCC) 12/10/2020   Chronic respiratory failure (HCC) 12/10/2020   Essential hypertension 12/10/2020   GERD (gastroesophageal reflux disease) 12/10/2020   Sepsis (HCC) 12/05/2017   PCP:  Lydia Guiles, MD Pharmacy:   [facility] [phone] [place], [place] - [address] AT [facility] OF [address] [place] [address] Phone: [phone] Fax: [phone]  [id number] [place], [place] - [address] AT [facility] OF [address] [facility] [address] [place] [address] Phone: [phone] Fax: [phone]     Social Determinants of Health (SDOH) Interventions    Readmission Risk Interventions    08/23/2021   11:05 AM  Readmission Risk Prevention Plan   Post Dischage Appt Complete  Medication Screening Complete  Transportation Screening Complete

## 2021-08-23 NOTE — Progress Notes (Signed)
Joseph [name] continues to exhibit signs of chronic respiratory failure due to COPD.  The use of the NIV will treat patient's high PC02 levels (54 on 08/23/21 with elevated bicarbonate of 36.7) and can reduce risk of exacerbations and future hospitalizations when used at night and during the day.  All alternate devices [phone] and [id number]) have been considered and ruled out as volume requirements are not met by BiLevel devices.  An NIV with volume-targeted pressure support is necessary to prevent patient from life-threatening harm.  Interruption or failure to provide NIV would quickly lead to exacerbation of the patient's condition, hospital re-admission, and likely harm to the patient. Continued use is preferred.  Patient is able to protect their airways and clear secretions on their own.

## 2021-08-23 NOTE — Progress Notes (Signed)
ABG obtained

## 2021-08-24 DIAGNOSIS — I10 Essential (primary) hypertension: Secondary | ICD-10-CM | POA: Diagnosis not present

## 2021-08-24 DIAGNOSIS — G4733 Obstructive sleep apnea (adult) (pediatric): Secondary | ICD-10-CM

## 2021-08-24 DIAGNOSIS — J441 Chronic obstructive pulmonary disease with (acute) exacerbation: Secondary | ICD-10-CM | POA: Diagnosis not present

## 2021-08-24 MED ORDER — PREDNISONE 20 MG PO TABS: ORAL_TABLET | ORAL | 0 refills | Status: DC

## 2021-08-24 MED ORDER — POLYVINYL ALCOHOL 1.4 % OP SOLN
2.0000 [drp] | OPHTHALMIC | Status: DC | PRN
  Filled 2021-08-24: qty 15

## 2021-08-24 NOTE — Progress Notes (Signed)
Pt is alert and oriented x4; VSS. He is independent in the room; wears oxygen as needed at home. Discharge instructions discussed with pt and family member. All questions/concerns addressed. All needs met; no further needs at this time. Pt still in the room, awaiting trilogy delivery. Family member is at bedside; pt is going to be discharge back to home.

## 2021-08-24 NOTE — Discharge Summary (Signed)
Physician Discharge Summary   [name] MRN: [id number] DOB: [birth year]  Admit date:     08/21/2021  Discharge date: 08/24/21  Discharge Physician: Joseph [name]   PCP: Joseph Robes, MD   Recommendations at discharge:   Follow-up with PCP in 1 week. Patient has a pulmonologist, he will see him in 2 weeks.  Discharge Diagnoses: Principal Problem:   COPD with acute exacerbation ([place]) Active Problems:   Essential hypertension   GERD (gastroesophageal reflux disease)   COPD exacerbation (HCC)   OSA (obstructive sleep apnea)  Resolved Problems:   * No resolved hospital problems. *  Hospital Course: Joseph [name] is a 41 y.o. male with medical history significant for COPD with chronic respiratory failure on 2 L of oxygen as needed, history of marijuana use, hypertension who was recently discharged from the hospital on 08/08/21. He presents to the ER for evaluation of a 2-day history of worsening shortness of breath from his baseline associated with a dry cough and pleuritic chest pain.  Patient has significant bronchospasm, was started on steroids.  Patient has normal troponin and BNP. Met patient wife 7/11, patient had a severe apnea and hypoxemia at nighttime.  Obtain overnight oximetry to qualify for trilogy.  Assessment and Plan: COPD exacerbation. Obstructive sleep apnea. Patient still has significant bronchospasm, but patient was wheezing  chronically.  He has evidence of severe obstructive sleep apnea.   Patient had a overnight oximetry and ABG, qualify for trilogy. Condition has improved, continue steroid taper, patient will be followed with PCP and pulmonology as outpatient.  Reviewed echocardiogram performed last month, ejection fraction 55 to 60%, right ventricular systolic function appears to be normal. Pulmonary pressure was not measured.   Essential hypertension. Resume home medicine with hydrochlorothiazide.         Consultants:  None Procedures performed: None  Disposition: Home Diet recommendation:  Discharge Diet Orders (From admission, onward)     Start     Ordered   08/24/21 0000  Diet - low sodium heart healthy        08/24/21 1005           Cardiac diet DISCHARGE MEDICATION: Allergies as of 08/24/2021       Reactions   Penicillins Shortness Of Breath   Pork-derived Products Nausea Only   Banana Hives   Eggs Or Egg-derived Products Hives        Medication List     TAKE these medications    albuterol 108 (90 Base) MCG/ACT inhaler Commonly known as: VENTOLIN HFA Inhale 2 puffs into the lungs every 6 (six) hours as needed for wheezing or shortness of breath.   albuterol (2.5 MG/3ML) 0.083% nebulizer solution Commonly known as: PROVENTIL Take 2.5 mg by nebulization every 6 (six) hours as needed for wheezing or shortness of breath.   Dupixent 300 MG/2ML prefilled syringe Generic drug: dupilumab Inject 300 mg into the skin every 14 (fourteen) days.   fluticasone-salmeterol 230-21 MCG/ACT inhaler Commonly known as: ADVAIR HFA Inhale 2 puffs into the lungs every 12 (twelve) hours.   hydrochlorothiazide 25 MG tablet Commonly known as: HYDRODIURIL Take 25 mg by mouth daily.   ibuprofen 800 MG tablet Commonly known as: ADVIL Take 800 mg by mouth 3 (three) times daily as needed for pain.   montelukast 10 MG tablet Commonly known as: SINGULAIR Take 10 mg by mouth at bedtime.   omeprazole 20 MG capsule Commonly known as: PRILOSEC Take 20 mg by mouth daily as needed (GERD symptoms).  predniSONE 2.5 MG tablet Commonly known as: DELTASONE Take 7.5 mg by mouth daily. What changed: Another medication with the same name was changed. Make sure you understand how and when to take each.   predniSONE 20 MG tablet Commonly known as: DELTASONE Take 2 tablets (40 mg total) by mouth daily with breakfast for 3 days, THEN 1 tablet (20 mg total) daily with breakfast for 3 days, THEN 0.5 tablets  (10 mg total) daily with breakfast for 3 days. Start taking on: August 25, 2021 What changed: See the new instructions.   sildenafil 20 MG tablet Commonly known as: REVATIO Take 60 mg by mouth daily as needed (ED symptoms).   tiZANidine 2 MG tablet Commonly known as: ZANAFLEX Take 2 mg by mouth 3 (three) times daily.        Follow-up Information     Joseph Robes, MD Follow up in 1 week(s).   Specialty: Family Medicine Contact information: [address] [place] [address] [phone]                Discharge Exam: Joseph [name] Weights   08/21/21 1330  Weight: 86.2 kg   General exam: Appears calm and comfortable  Respiratory system: Decreased breath sounds with wheezes. Respiratory effort normal. Cardiovascular system: S1 & S2 heard, RRR. No JVD, murmurs, rubs, gallops or clicks. No pedal edema. Gastrointestinal system: Abdomen is nondistended, soft and nontender. No organomegaly or masses felt. Normal bowel sounds heard. Central nervous system: Alert and oriented. No focal neurological deficits. Extremities: Symmetric 5 x 5 power. Skin: No rashes, lesions or ulcers Psychiatry: Judgement and insight appear normal. Mood & affect appropriate.    Condition at discharge: fair  The results of significant diagnostics from this hospitalization (including imaging, microbiology, ancillary and laboratory) are listed below for reference.   Imaging Studies: DG Chest 2 View  Result Date: 08/21/2021 CLINICAL DATA:  Shortness of breath.  Face and leg swelling. EXAM: CHEST - 2 VIEW COMPARISON:  Chest x-ray dated August 07, 2021. FINDINGS: The heart size and mediastinal contours are within normal limits. Normal pulmonary vascularity. No focal consolidation, pleural effusion, or pneumothorax. No acute osseous abnormality. Multiple old right-sided rib fractures again noted. IMPRESSION: 1. No acute cardiopulmonary disease. Electronically Signed   By: Titus Dubin M.D.   On:  08/21/2021 11:37   ECHOCARDIOGRAM COMPLETE  Result Date: 08/07/2021    ECHOCARDIOGRAM REPORT   Patient Name:   Joseph [name] Date of Exam: 08/07/2021 Medical Rec #:  [id number]       Height:       65.0 in Accession #:    [id number]      Weight:       190.0 lb Date of Birth:  Dec 26, 1980        BSA:          1.935 m Patient Age:    45 years        BP:           155/105 mmHg Patient Gender: M               HR:           102 bpm. Exam Location:  [facility] Procedure: 2D Echo Indications:     Dyspnea R06.00  History:         Patient has prior history of Echocardiogram examinations, most                  recent 12/06/2017.  Sonographer:  Kathlen Brunswick RDCS Referring Phys:  [id number] [place] Diagnosing Phys: Dorris Carnes MD IMPRESSIONS  1. Left ventricular ejection fraction, by estimation, is 55 to 60%. The left ventricle has normal function. The left ventricle has no regional wall motion abnormalities. Left ventricular diastolic parameters are indeterminate.  2. Right ventricular systolic function is normal. The right ventricular size is normal.  3. The mitral valve is normal in structure. Mild mitral valve regurgitation.  4. The aortic valve is tricuspid. Aortic valve regurgitation is not visualized.  5. The inferior vena cava is normal in size with greater than 50% respiratory variability, suggesting right atrial pressure of 3 mmHg. Comparison(s): The left ventricular function is unchanged. FINDINGS  Left Ventricle: Left ventricular ejection fraction, by estimation, is 55 to 60%. The left ventricle has normal function. The left ventricle has no regional wall motion abnormalities. The left ventricular internal cavity size was normal in size. There is  no left ventricular hypertrophy. Left ventricular diastolic parameters are indeterminate. Right Ventricle: The right ventricular size is normal. Right vetricular wall thickness was not assessed. Right ventricular systolic function is normal. Left Atrium: Left atrial  size was normal in size. Right Atrium: Right atrial size was normal in size. Pericardium: Trivial pericardial effusion is present. Mitral Valve: The mitral valve is normal in structure. Mild mitral valve regurgitation. Tricuspid Valve: The tricuspid valve is grossly normal. Tricuspid valve regurgitation is not demonstrated. Aortic Valve: The aortic valve is tricuspid. Aortic valve regurgitation is not visualized. Aortic valve peak gradient measures 3.7 mmHg. Pulmonic Valve: The pulmonic valve was not well visualized. Pulmonic valve regurgitation is not visualized. No evidence of pulmonic stenosis. Aorta: The aortic root is normal in size and structure. Venous: The inferior vena cava is normal in size with greater than 50% respiratory variability, suggesting right atrial pressure of 3 mmHg. IAS/Shunts: No atrial level shunt detected by color flow Doppler.  LEFT VENTRICLE PLAX 2D LVIDd:         4.84 cm     Diastology LVIDs:         3.35 cm     LV e' medial:    9.68 cm/s LV PW:         1.19 cm     LV E/e' medial:  10.8 LV IVS:        0.94 cm     LV e' lateral:   15.10 cm/s LVOT diam:     2.10 cm     LV E/e' lateral: 7.0 LV SV:         52 LV SV Index:   27 LVOT Area:     3.46 cm  LV Volumes (MOD) LV vol d, MOD A2C: 90.6 ml LV vol d, MOD A4C: 77.6 ml LV vol s, MOD A2C: 42.5 ml LV vol s, MOD A4C: 32.1 ml LV SV MOD A2C:     48.1 ml LV SV MOD A4C:     77.6 ml LV SV MOD BP:      47.2 ml RIGHT VENTRICLE RV Basal diam:  3.12 cm RV S prime:     22.90 cm/s TAPSE (M-mode): 2.1 cm LEFT ATRIUM             Index        RIGHT ATRIUM           Index LA diam:        3.70 cm 1.91 cm/m   RA Area:     10.60 cm LA Vol (A2C):  48.1 ml 24.85 ml/m  RA Volume:   24.50 ml  12.66 ml/m LA Vol (A4C):   24.8 ml 12.81 ml/m LA Biplane Vol: 37.0 ml 19.12 ml/m  AORTIC VALVE                 PULMONIC VALVE AV Area (Vmax): 3.05 cm     PV Vmax:       0.96 m/s AV Vmax:        96.40 cm/s   PV Peak grad:  3.7 mmHg AV Peak Grad:   3.7 mmHg LVOT Vmax:       85.00 cm/s LVOT Vmean:     53.700 cm/s LVOT VTI:       0.149 m  AORTA Ao Root diam: 3.30 cm MITRAL VALVE MV Area (PHT): 8.07 cm     SHUNTS MV Decel Time: 94 msec      Systemic VTI:  0.15 m MV E velocity: 105.00 cm/s  Systemic Diam: 2.10 cm MV A velocity: 119.00 cm/s MV E/A ratio:  0.88 Dorris Carnes MD Electronically signed by Dorris Carnes MD Signature Date/Time: 08/07/2021/9:36:45 PM    Final    DG Chest Portable 1 View  Result Date: 08/07/2021 CLINICAL DATA:  Chest pain and short of breath EXAM: PORTABLE CHEST 1 VIEW COMPARISON:  01/28/2021 FINDINGS: Heart size and vascularity normal. Mild right lower lobe airspace disease has developed since the prior study. Left lung clear. No effusion. Chronic right rib fractures unchanged IMPRESSION: Question early infiltrate right lower lobe Chronic right rib fractures Electronically Signed   By: Franchot Gallo M.D.   On: 08/07/2021 11:16    Microbiology: Results for orders placed or performed during the hospital encounter of 08/21/21  Resp Panel by RT-PCR (Flu A&B, Covid) Anterior Nasal Swab     Status: None   Collection Time: 08/21/21 11:45 AM   Specimen: Anterior Nasal Swab  Result Value Ref Range Status   SARS Coronavirus 2 by RT PCR NEGATIVE NEGATIVE Final    Comment: (NOTE) SARS-CoV-2 target nucleic acids are NOT DETECTED.  The SARS-CoV-2 RNA is generally detectable in upper respiratory specimens during the acute phase of infection. The lowest concentration of SARS-CoV-2 viral copies this assay can detect is 138 copies/mL. A negative result does not preclude SARS-Cov-2 infection and should not be used as the sole basis for treatment or other patient management decisions. A negative result may occur with  improper specimen collection/handling, submission of specimen other than nasopharyngeal swab, presence of viral mutation(s) within the areas targeted by this assay, and inadequate number of viral copies(<138 copies/mL). A negative result must be  combined with clinical observations, patient history, and epidemiological information. The expected result is Negative.  Fact Sheet for Patients:  EntrepreneurPulse.com.au  Fact Sheet for Healthcare Providers:  IncredibleEmployment.be  This test is no t yet approved or cleared by the [place] FDA and  has been authorized for detection and/or diagnosis of SARS-CoV-2 by FDA under an Emergency Use Authorization (EUA). This EUA will remain  in effect (meaning this test can be used) for the duration of the COVID-19 declaration under Section 564(b)(1) of the Act, 21 U.S.C.section 360bbb-3(b)(1), unless the authorization is terminated  or revoked sooner.       Influenza A by PCR NEGATIVE NEGATIVE Final   Influenza B by PCR NEGATIVE NEGATIVE Final    Comment: (NOTE) The Xpert Xpress SARS-CoV-2/FLU/RSV plus assay is intended as an aid in the diagnosis of influenza from Nasopharyngeal swab specimens and should not be  used as a sole basis for treatment. Nasal washings and aspirates are unacceptable for Xpert Xpress SARS-CoV-2/FLU/RSV testing.  Fact Sheet for Patients: EntrepreneurPulse.com.au  Fact Sheet for Healthcare Providers: IncredibleEmployment.be  This test is not yet approved or cleared by the [place] FDA and has been authorized for detection and/or diagnosis of SARS-CoV-2 by FDA under an Emergency Use Authorization (EUA). This EUA will remain in effect (meaning this test can be used) for the duration of the COVID-19 declaration under Section 564(b)(1) of the Act, 21 U.S.C. section 360bbb-3(b)(1), unless the authorization is terminated or revoked.  Performed at [facility], [address] [place], [place] [address]     Labs: CBC: Recent Labs  Lab 08/21/21 1115 08/22/21 0426  WBC 19.2* 18.3*  HGB 14.7 13.7  HCT 47.3 43.6  MCV 82.8 81.5  PLT 375 124   Basic Metabolic  Panel: Recent Labs  Lab 08/21/21 1115 08/22/21 0426  NA 142 140  K 4.0 4.1  CL 104 101  CO2 32 33*  GLUCOSE 95 146*  BUN 22* 29*  CREATININE 0.98 1.09  CALCIUM 8.6* 8.6*  MG 2.1  --    Liver Function Tests: No results for input(s): "AST", "ALT", "ALKPHOS", "BILITOT", "PROT", "ALBUMIN" in the last 168 hours. CBG: No results for input(s): "GLUCAP" in the last 168 hours.  Discharge time spent: greater than 30 minutes.  Signed: Sharen Hones, MD [facility] 08/24/2021

## 2021-08-26 ENCOUNTER — Other Ambulatory Visit: Payer: Self-pay

## 2021-08-26 ENCOUNTER — Emergency Department
Admission: EM | Admit: 2021-08-26 | Discharge: 2021-08-26 | Disposition: A | Discharge status: Home / Self Care | Payer: Medicare Other | Source: Home / Self Care | Attending: Emergency Medicine | Admitting: Emergency Medicine

## 2021-08-26 DIAGNOSIS — J209 Acute bronchitis, unspecified: Secondary | ICD-10-CM | POA: Diagnosis not present

## 2021-08-26 DIAGNOSIS — H10021 Other mucopurulent conjunctivitis, right eye: Secondary | ICD-10-CM | POA: Insufficient documentation

## 2021-08-26 DIAGNOSIS — J441 Chronic obstructive pulmonary disease with (acute) exacerbation: Secondary | ICD-10-CM | POA: Insufficient documentation

## 2021-08-26 DIAGNOSIS — R059 Cough, unspecified: Secondary | ICD-10-CM | POA: Diagnosis not present

## 2021-08-26 DIAGNOSIS — H109 Unspecified conjunctivitis: Secondary | ICD-10-CM

## 2021-08-26 MED ORDER — FLUORESCEIN SODIUM 1 MG OP STRP
1.0000 | ORAL_STRIP | Freq: Once | OPHTHALMIC | Status: AC
  Administered 2021-08-26: 1 via OPHTHALMIC
  Filled 2021-08-26: qty 1

## 2021-08-26 MED ORDER — TETRACAINE HCL 0.5 % OP SOLN
2.0000 [drp] | Freq: Once | OPHTHALMIC | Status: AC
  Administered 2021-08-26: 2 [drp] via OPHTHALMIC
  Filled 2021-08-26: qty 4

## 2021-08-26 MED ORDER — TOBRAMYCIN 0.3 % OP OINT: TOPICAL_OINTMENT | Freq: Four times a day (QID) | OPHTHALMIC | 0 refills | Status: AC

## 2021-08-26 NOTE — ED Notes (Signed)
[facility]: 20/50 R eye; 20/25 L eye.

## 2021-08-26 NOTE — ED Provider Notes (Signed)
[facility] Provider Note    Event Date/Time   First MD Initiated Contact with Patient 08/26/21 1214     (approximate)   History   Eye Problem   HPI  Joseph [name] is a 41 y.o. male   presents to the ED with complaint of irritation to his right eye.  Patient denies any injury or exposure that may have caused this.  He states that he has had problems with his eye during his hospitalization the first of this month and was prescribed a eyedrop that he used maybe twice at the most but because it caused burning sensation he discontinued it immediately.  He states that the eyedrop contained alcohol.  Patient was admitted on 08/21/2021 for acute exacerbation of his COPD.      Physical Exam   Triage Vital Signs: ED Triage Vitals  Enc Vitals Group     BP 08/26/21 1011 (!) 177/116     Pulse Rate 08/26/21 1011 100     Resp 08/26/21 1011 19     Temp 08/26/21 1011 98.5 F (36.9 C)     Temp Source 08/26/21 1011 Oral     SpO2 08/26/21 1011 94 %     Weight 08/26/21 1038 190 lb (86.2 kg)     Height 08/26/21 1038 5\' 5"  (1.651 m)     Head Circumference --      Peak Flow --      Pain Score 08/26/21 1037 9     Pain Loc --      Pain Edu? --      Excl. in GC? --     Most recent vital signs: Vitals:   08/26/21 1011 08/26/21 1410  BP: (!) 177/116 (!) 168/100  Pulse: 100 89  Resp: 19 16  Temp: 98.5 F (36.9 C)   SpO2: 94% 95%     General: Awake, no distress.  CV:  Good peripheral perfusion.  Resp:  Normal effort.  Abd:  No distention.  Other:  Examination right eye there is minimal injection noted.  No foreign body.  EOMI's.  PERRLA.  There is some minimal yellow exudate noted with crustiness in the eyelashes along with on the patient's cheek that has dried.  Tetracaine was applied and no foreign body and with fluorescein stain there was no corneal abrasion noted.  Patient has only mild photophobia and was able to tolerate light while examining his eye  without any difficulty.  Visual acuity as noted below.   20/50 R eye; 20/25 L eye  ED Results / Procedures / Treatments   Labs (all labs ordered are listed, but only abnormal results are displayed) Labs Reviewed - No data to display    PROCEDURES:  Critical Care performed:   Procedures   MEDICATIONS ORDERED IN ED: Medications  tetracaine (PONTOCAINE) 0.5 % ophthalmic solution 2 drop (2 drops Right Eye Given by Other 08/26/21 1405)  fluorescein ophthalmic strip 1 strip (1 strip Right Eye Given by Other 08/26/21 1405)     IMPRESSION / MDM / ASSESSMENT AND PLAN / ED COURSE  I reviewed the triage vital signs and the nursing notes.   Differential diagnosis includes, but is not limited to, right eye conjunctivitis, foreign body, eye injury.  41 year old male presents to the ED with complaint of eye irritation that has continued since his hospitalization on 08/21/2021 at which time he says he was prescribed an eye medication that contained alcohol.  He only used it once or twice and then  discontinued it because it was burning his eye.  This is the first time he is being followed up for this complaint and his hospitalization at that time was respiratory in nature.  Looking through his records I was unable to find any eyedrop that he was prescribed.  Exam was essentially negative with minimal injection and minimal yellow drainage noted in the medial aspect and into the lashes. patient was able to tolerate light during his exam.  Patient was prescribed Tobrex ophthalmic ointment to apply to his eye and he is reassured that there is no alcohol in this.  He is also strongly encouraged to call and make an appointment with [facility] for follow-up.  Dr. Inez Pilgrim who is on-call today was listed on his discharge papers with phone number and address.      Patient's presentation is most consistent with acute, uncomplicated illness.  FINAL CLINICAL IMPRESSION(S) / ED DIAGNOSES   Final  diagnoses:  Bacterial conjunctivitis of right eye     Rx / DC Orders   ED Discharge Orders          Ordered    tobramycin (TOBREX) 0.3 % ophthalmic ointment  4 times daily        08/26/21 1357             Note:  This document was prepared using Dragon voice recognition software and may include unintentional dictation errors.   Tommi Rumps, [name] 08/26/21 1544    Sharyn Creamer, MD 08/26/21 2037

## 2021-08-26 NOTE — Discharge Instructions (Signed)
Call make an appoint with Dr. Inez Pilgrim who is on-call for [facility].  Begin using the eye ointment that was prescribed for you today.  Continue taking your regular medication.

## 2021-08-26 NOTE — ED Triage Notes (Signed)
Pt in via POV; seen recently due to irritation of R eye; denies specific mechanism of injury or known substance going into eye; pt reports swelling at eye is worse than when he was d/c; vision remains blurry in R eye; pt reports extreme pain at eye; pt has sunglasses with him due to sensitivity to light; denies drainage; denies fever; pt reports when he wipes his eye his nose becomes runny.

## 2021-08-29 ENCOUNTER — Other Ambulatory Visit: Payer: Self-pay

## 2021-08-29 ENCOUNTER — Emergency Department: Payer: Medicare Other

## 2021-08-29 ENCOUNTER — Inpatient Hospital Stay
Admission: EM | Admit: 2021-08-29 | Discharge: 2021-09-02 | DRG: 202 | Disposition: A | Discharge status: Home / Self Care | Payer: Medicare Other | Attending: Obstetrics and Gynecology | Admitting: Obstetrics and Gynecology

## 2021-08-29 DIAGNOSIS — I890 Lymphedema, not elsewhere classified: Secondary | ICD-10-CM | POA: Diagnosis present

## 2021-08-29 DIAGNOSIS — D72829 Elevated white blood cell count, unspecified: Secondary | ICD-10-CM | POA: Diagnosis present

## 2021-08-29 DIAGNOSIS — J209 Acute bronchitis, unspecified: Secondary | ICD-10-CM | POA: Diagnosis present

## 2021-08-29 DIAGNOSIS — Z91018 Allergy to other foods: Secondary | ICD-10-CM

## 2021-08-29 DIAGNOSIS — Z880 Allergy status to penicillin: Secondary | ICD-10-CM | POA: Diagnosis not present

## 2021-08-29 DIAGNOSIS — R051 Acute cough: Secondary | ICD-10-CM | POA: Diagnosis not present

## 2021-08-29 DIAGNOSIS — J4540 Moderate persistent asthma, uncomplicated: Secondary | ICD-10-CM | POA: Diagnosis present

## 2021-08-29 DIAGNOSIS — J9621 Acute and chronic respiratory failure with hypoxia: Secondary | ICD-10-CM | POA: Diagnosis present

## 2021-08-29 DIAGNOSIS — E669 Obesity, unspecified: Secondary | ICD-10-CM | POA: Diagnosis present

## 2021-08-29 DIAGNOSIS — Z7952 Long term (current) use of systemic steroids: Secondary | ICD-10-CM | POA: Diagnosis not present

## 2021-08-29 DIAGNOSIS — R5381 Other malaise: Secondary | ICD-10-CM | POA: Diagnosis present

## 2021-08-29 DIAGNOSIS — I10 Essential (primary) hypertension: Secondary | ICD-10-CM | POA: Diagnosis present

## 2021-08-29 DIAGNOSIS — Z6832 Body mass index (BMI) 32.0-32.9, adult: Secondary | ICD-10-CM

## 2021-08-29 DIAGNOSIS — F1290 Cannabis use, unspecified, uncomplicated: Secondary | ICD-10-CM | POA: Diagnosis present

## 2021-08-29 DIAGNOSIS — R600 Localized edema: Secondary | ICD-10-CM | POA: Diagnosis present

## 2021-08-29 DIAGNOSIS — R059 Cough, unspecified: Secondary | ICD-10-CM | POA: Diagnosis present

## 2021-08-29 DIAGNOSIS — Z79899 Other long term (current) drug therapy: Secondary | ICD-10-CM | POA: Diagnosis not present

## 2021-08-29 DIAGNOSIS — Z8249 Family history of ischemic heart disease and other diseases of the circulatory system: Secondary | ICD-10-CM

## 2021-08-29 DIAGNOSIS — T380X5A Adverse effect of glucocorticoids and synthetic analogues, initial encounter: Secondary | ICD-10-CM | POA: Diagnosis present

## 2021-08-29 DIAGNOSIS — J438 Other emphysema: Secondary | ICD-10-CM | POA: Diagnosis present

## 2021-08-29 DIAGNOSIS — J441 Chronic obstructive pulmonary disease with (acute) exacerbation: Principal | ICD-10-CM

## 2021-08-29 DIAGNOSIS — R918 Other nonspecific abnormal finding of lung field: Secondary | ICD-10-CM | POA: Diagnosis present

## 2021-08-29 DIAGNOSIS — H1089 Other conjunctivitis: Secondary | ICD-10-CM | POA: Diagnosis present

## 2021-08-29 DIAGNOSIS — F17200 Nicotine dependence, unspecified, uncomplicated: Secondary | ICD-10-CM | POA: Diagnosis present

## 2021-08-29 DIAGNOSIS — Z20822 Contact with and (suspected) exposure to covid-19: Secondary | ICD-10-CM | POA: Diagnosis present

## 2021-08-29 DIAGNOSIS — J9610 Chronic respiratory failure, unspecified whether with hypoxia or hypercapnia: Secondary | ICD-10-CM | POA: Diagnosis present

## 2021-08-29 DIAGNOSIS — G4733 Obstructive sleep apnea (adult) (pediatric): Secondary | ICD-10-CM | POA: Diagnosis present

## 2021-08-29 DIAGNOSIS — Z9981 Dependence on supplemental oxygen: Secondary | ICD-10-CM

## 2021-08-29 DIAGNOSIS — I160 Hypertensive urgency: Secondary | ICD-10-CM | POA: Diagnosis present

## 2021-08-29 DIAGNOSIS — Z91014 Allergy to mammalian meats: Secondary | ICD-10-CM

## 2021-08-29 DIAGNOSIS — Z7951 Long term (current) use of inhaled steroids: Secondary | ICD-10-CM

## 2021-08-29 DIAGNOSIS — J9611 Chronic respiratory failure with hypoxia: Secondary | ICD-10-CM | POA: Diagnosis not present

## 2021-08-29 DIAGNOSIS — J440 Chronic obstructive pulmonary disease with acute lower respiratory infection: Secondary | ICD-10-CM | POA: Diagnosis not present

## 2021-08-29 LAB — RESPIRATORY PANEL BY PCR

## 2021-08-29 LAB — BASIC METABOLIC PANEL
Anion gap: 5 (ref 5–15)
BUN: 26 mg/dL — ABNORMAL HIGH (ref 6–20)
CO2: 28 mmol/L (ref 22–32)
Calcium: 8 mg/dL — ABNORMAL LOW (ref 8.9–10.3)
Chloride: 107 mmol/L (ref 98–111)
Creatinine, Ser: 1.05 mg/dL (ref 0.61–1.24)
GFR, Estimated: >60 mL/min (ref >60)
Glucose, Bld: 99 mg/dL (ref 70–99)
Potassium: 4.1 mmol/L (ref 3.5–5.1)
Sodium: 140 mmol/L (ref 135–145)

## 2021-08-29 LAB — CBC
HCT: 46.3 % (ref 39.0–52.0)
Hemoglobin: 14.6 g/dL (ref 13.0–17.0)
MCH: 25.9 pg — ABNORMAL LOW (ref 26.0–34.0)
MCHC: 31.5 g/dL (ref 30.0–36.0)
MCV: 82.1 fL (ref 80.0–100.0)
Platelets: 446 10*3/uL — ABNORMAL HIGH (ref 150–400)
RBC: 5.64 MIL/uL (ref 4.22–5.81)
RDW: 18.6 % — ABNORMAL HIGH (ref 11.5–15.5)
WBC: 28.1 10*3/uL — ABNORMAL HIGH (ref 4.0–10.5)
nRBC: 0 % (ref 0.0–0.2)

## 2021-08-29 LAB — BRAIN NATRIURETIC PEPTIDE: B Natriuretic Peptide: 26.3 pg/mL (ref 0.0–100.0)

## 2021-08-29 LAB — SEDIMENTATION RATE: Sed Rate: 12 mm/hr (ref 0–15)

## 2021-08-29 LAB — C-REACTIVE PROTEIN: CRP: 0.7 mg/dL (ref <1.0)

## 2021-08-29 LAB — HEPATIC FUNCTION PANEL
ALT: 16 U/L (ref 0–44)
AST: 27 U/L (ref 15–41)
Albumin: 2.5 g/dL — ABNORMAL LOW (ref 3.5–5.0)
Alkaline Phosphatase: 57 U/L (ref 38–126)
Bilirubin, Direct: 0.4 mg/dL — ABNORMAL HIGH (ref 0.0–0.2)
Indirect Bilirubin: 0.5 mg/dL (ref 0.3–0.9)
Total Bilirubin: 0.9 mg/dL (ref 0.3–1.2)
Total Protein: 5.3 g/dL — ABNORMAL LOW (ref 6.5–8.1)

## 2021-08-29 LAB — TROPONIN I (HIGH SENSITIVITY): Troponin I (High Sensitivity): 14 ng/L (ref <18)

## 2021-08-29 LAB — PROCALCITONIN: Procalcitonin: <0.1 ng/mL

## 2021-08-29 MED ORDER — PREDNISONE 20 MG PO TABS
40.0000 mg | ORAL_TABLET | Freq: Every day | ORAL | Status: AC
  Administered 2021-08-30 – 2021-09-02 (×4): 40 mg via ORAL
  Filled 2021-08-29 (×4): qty 2

## 2021-08-29 MED ORDER — HYDROCHLOROTHIAZIDE 25 MG PO TABS
25.0000 mg | ORAL_TABLET | Freq: Every day | ORAL | Status: DC
  Administered 2021-08-29 – 2021-09-02 (×5): 25 mg via ORAL
  Filled 2021-08-29 (×5): qty 1

## 2021-08-29 MED ORDER — PREDNISONE 20 MG PO TABS
40.0000 mg | ORAL_TABLET | Freq: Once | ORAL | Status: AC
  Administered 2021-08-29: 40 mg via ORAL
  Filled 2021-08-29: qty 2

## 2021-08-29 MED ORDER — PANTOPRAZOLE SODIUM 40 MG PO TBEC
40.0000 mg | DELAYED_RELEASE_TABLET | Freq: Every day | ORAL | Status: DC
  Administered 2021-08-29 – 2021-09-02 (×5): 40 mg via ORAL
  Filled 2021-08-29 (×5): qty 1

## 2021-08-29 MED ORDER — ALBUTEROL SULFATE (2.5 MG/3ML) 0.083% IN NEBU
3.0000 mL | INHALATION_SOLUTION | Freq: Four times a day (QID) | RESPIRATORY_TRACT | Status: DC | PRN
  Administered 2021-08-29 – 2021-09-02 (×6): 3 mL via RESPIRATORY_TRACT
  Filled 2021-08-29 (×6): qty 3
  Filled 2021-08-29: qty 9
  Filled 2021-08-29: qty 3

## 2021-08-29 MED ORDER — HYDROCHLOROTHIAZIDE 25 MG PO TABS
25.0000 mg | ORAL_TABLET | Freq: Once | ORAL | Status: AC
  Administered 2021-08-29: 25 mg via ORAL
  Filled 2021-08-29: qty 1

## 2021-08-29 MED ORDER — IPRATROPIUM-ALBUTEROL 0.5-2.5 (3) MG/3ML IN SOLN
3.0000 mL | Freq: Once | RESPIRATORY_TRACT | Status: AC
  Administered 2021-08-29: 3 mL via RESPIRATORY_TRACT
  Filled 2021-08-29: qty 3

## 2021-08-29 MED ORDER — FLUTICASONE FUROATE-VILANTEROL 200-25 MCG/ACT IN AEPB
1.0000 | INHALATION_SPRAY | Freq: Every day | RESPIRATORY_TRACT | Status: DC
  Administered 2021-08-30 – 2021-09-02 (×4): 1 via RESPIRATORY_TRACT
  Filled 2021-08-29: qty 28

## 2021-08-29 MED ORDER — METHYLPREDNISOLONE SODIUM SUCC 125 MG IJ SOLR
125.0000 mg | Freq: Two times a day (BID) | INTRAMUSCULAR | Status: AC
  Administered 2021-08-29 – 2021-08-30 (×2): 125 mg via INTRAVENOUS
  Filled 2021-08-29 (×2): qty 2

## 2021-08-29 MED ORDER — MONTELUKAST SODIUM 10 MG PO TABS
10.0000 mg | ORAL_TABLET | Freq: Every day | ORAL | Status: DC
  Administered 2021-08-29 – 2021-09-01 (×4): 10 mg via ORAL
  Filled 2021-08-29 (×4): qty 1

## 2021-08-29 MED ORDER — IOHEXOL 300 MG/ML  SOLN
75.0000 mL | Freq: Once | INTRAMUSCULAR | Status: AC | PRN
  Administered 2021-08-29: 75 mL via INTRAVENOUS

## 2021-08-29 MED ORDER — TOBRAMYCIN 0.3 % OP OINT
TOPICAL_OINTMENT | Freq: Four times a day (QID) | OPHTHALMIC | Status: DC
Start: 2021-08-29 — End: 2021-08-30
  Filled 2021-08-29 (×2): qty 3.5

## 2021-08-29 MED ORDER — IPRATROPIUM-ALBUTEROL 0.5-2.5 (3) MG/3ML IN SOLN
6.0000 mL | Freq: Once | RESPIRATORY_TRACT | Status: AC
  Administered 2021-08-29: 6 mL via RESPIRATORY_TRACT
  Filled 2021-08-29: qty 3

## 2021-08-29 MED ORDER — FUROSEMIDE 10 MG/ML IJ SOLN
40.0000 mg | Freq: Once | INTRAMUSCULAR | Status: AC
  Administered 2021-08-29: 40 mg via INTRAVENOUS
  Filled 2021-08-29: qty 4

## 2021-08-29 NOTE — Assessment & Plan Note (Signed)
Continue HCTZ

## 2021-08-29 NOTE — ED Notes (Signed)
ED Provider at bedside. 

## 2021-08-29 NOTE — Assessment & Plan Note (Addendum)
Low suspicion for DVT but no recent imaging, and given immobility at  Home, will scan [place] BLE DVT protocol. Likely secondary to long term steroid use, weakness, immobility. PT eval for mobility, lymphedema May benefit from outpatient vascular evaluation. BNP not elevated

## 2021-08-29 NOTE — Assessment & Plan Note (Signed)
On 2L [place], currently also on 2L [place]

## 2021-08-29 NOTE — Assessment & Plan Note (Signed)
Does not appear to have systemic infection burden. He is subacute steroids, leukocytosis secondary to oral steroid ingestion

## 2021-08-29 NOTE — ED Triage Notes (Addendum)
Pt comes with c/o increased SOB and feels his asthma is acting up. Pt recently seen here for same and was admitted. Pt also has hx of copd. Pt states still having swelling all over. Pt has noticeable ankle swelling. Pt has pitting edema. Pt has noticeable labored breathing and accessory muscle use. Pt states they increased his dosage of presidone. Pt is not on lasix. Pt states he has been on presidone all his life.

## 2021-08-29 NOTE — ED Notes (Signed)
Pt taken to xray and then they will bring to room 5

## 2021-08-29 NOTE — Assessment & Plan Note (Addendum)
H/o Bronchospasm, chronic wheezing.  ?OSA - chronic 2L [employer], usually per patient uses it PRN, but has been using more regularly - admit, med tele, IV steroids, ?antimicrobials, will test viral panel. - Pulmonology evaluation given recurrent admissions  - no evidence of CHF exacerbation at this time. 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 Fortunately he is stable on 2L [place], will request Pulm review of care plan and recommendations. Messaged Dr. Karna Christmas

## 2021-08-29 NOTE — ED Provider Notes (Signed)
[facility] Provider Note    Event Date/Time   First MD Initiated Contact with Patient 08/29/21 [phone]     (approximate)   History   Chief Complaint Shortness of Breath   HPI  Joseph [name] is a 41 y.o. male with past medical history of hypertension, COPD/asthma overlap, chronic hypoxic respiratory failure on 2 L nasal cannula as needed who presents to the ED complaining of shortness of breath.  Patient reports that over the past 2 days he has been dealing with increasing difficulty breathing and a dry cough.  He reports intermittent sharp pain in his left upper chest, particularly when he coughs.  He has also noticed that his legs have become progressively more swollen, denies any associated redness or pain.  He has not had any fevers, has been using his inhaler with partial relief.  He states he has been admitted for asthma twice in the past few weeks, was initially feeling better when he left the hospital.  He was also dealing with swelling in his legs at that time but echocardiogram was unremarkable.     Physical Exam   Triage Vital Signs: ED Triage Vitals  Enc Vitals Group     BP 08/29/21 0811 (!) 163/114     Pulse Rate 08/29/21 0811 (!) 117     Resp 08/29/21 0811 (!) 22     Temp 08/29/21 0811 98.2 F (36.8 C)     Temp src --      SpO2 08/29/21 0811 90 %     Weight --      Height --      Head Circumference --      Peak Flow --      Pain Score 08/29/21 0802 0     Pain Loc --      Pain Edu? --      Excl. in GC? --     Most recent vital signs: Vitals:   08/29/21 1030 08/29/21 1045  BP: (!) 165/121   Pulse: (!) 103 (!) 102  Resp: (!) 28 (!) 23  Temp:    SpO2: 95% 95%    Constitutional: Alert and oriented. Eyes: Conjunctivae are normal. Head: Atraumatic. Nose: No congestion/rhinnorhea. Mouth/Throat: Mucous membranes are moist.  Cardiovascular: Normal rate, regular rhythm. Grossly normal heart sounds.  2+ radial pulses  bilaterally. Respiratory: Normal respiratory effort.  No retractions. Lungs with inspiratory and expiratory wheezing throughout. Gastrointestinal: Soft and nontender. No distention. Musculoskeletal: 2+ pitting edema to knees bilaterally with no associated tenderness. Neurologic:  Normal speech and language. No gross focal neurologic deficits are appreciated.    ED Results / Procedures / Treatments   Labs (all labs ordered are listed, but only abnormal results are displayed) Labs Reviewed  CBC - Abnormal; Notable for the following components:      Result Value   WBC 28.1 (*)    MCH 25.9 (*)    RDW 18.6 (*)    Platelets 446 (*)    All other components within normal limits  BASIC METABOLIC PANEL - Abnormal; Notable for the following components:   BUN 26 (*)    Calcium 8.0 (*)    All other components within normal limits  HEPATIC FUNCTION PANEL - Abnormal; Notable for the following components:   Total Protein 5.3 (*)    Albumin 2.5 (*)    Bilirubin, Direct 0.4 (*)    All other components within normal limits  BRAIN NATRIURETIC PEPTIDE  TROPONIN I (HIGH SENSITIVITY)  EKG  ED ECG REPORT I, [name], the attending physician, personally viewed and interpreted this ECG.   Date: 08/29/2021  EKG Time: 8:09  Rate: 116  Rhythm: sinus tachycardia  Axis: Normal  Intervals:none  ST&T Change: None  RADIOLOGY CXR reviewed and interpreted by me with no infiltrate, edema, or effusion.  PROCEDURES:  Critical Care performed: No  Procedures   MEDICATIONS ORDERED IN ED: Medications  ipratropium-albuterol (DUONEB) 0.5-2.5 (3) MG/3ML nebulizer solution 3 mL (3 mLs Nebulization Given 08/29/21 0845)  furosemide (LASIX) injection 40 mg (40 mg Intravenous Given 08/29/21 0958)  ipratropium-albuterol (DUONEB) 0.5-2.5 (3) MG/3ML nebulizer solution 6 mL (6 mLs Nebulization Given 08/29/21 0958)  predniSONE (DELTASONE) tablet 40 mg (40 mg Oral Given 08/29/21 0958)  hydrochlorothiazide  (HYDRODIURIL) tablet 25 mg (25 mg Oral Given 08/29/21 1055)  ipratropium-albuterol (DUONEB) 0.5-2.5 (3) MG/3ML nebulizer solution 3 mL (3 mLs Nebulization Given 08/29/21 1055)     IMPRESSION / MDM / ASSESSMENT AND PLAN / ED COURSE  I reviewed the triage vital signs and the nursing notes.                              41 y.o. male with past medical history of hypertension and COPD/asthma overlap with chronic hypoxic respiratory failure on 2 L nasal cannula as needed who presents to the ED with increasing difficulty breathing, cough, and leg swelling over the past 2 days.  Patient's presentation is most consistent with acute presentation with potential threat to life or bodily function.  Differential diagnosis includes, but is not limited to, asthma exacerbation, COPD exacerbation, CHF, PE, ACS, pneumonia, pneumothorax, DVT.  Patient well-appearing and in no acute distress, vital signs remarkable for tachycardia and elevated blood pressure with mild tachypnea.  Patient noted to have O2 saturations of 90% on room air, placed on his usual 2 L nasal cannula with improvement.  He has equal edema to bilateral lower extremities that appears consistent with fluid retention, no findings on exam to suggest DVT and symptoms do not seem consistent with PE.  EKG shows sinus tachycardia without ischemic changes, will screen troponin but suspect his shortness of breath is due to exacerbation of COPD/asthma.  He is already on a steroid taper, we will further assess with chest x-ray and treat with DuoNeb.   Chest x-ray is unremarkable and BNP within normal limits, no findings to suggest CHF.  He continues to have significant wheezing despite breathing treatments, was given additional DuoNeb and dose of steroids to total 60 mg of prednisone for the day.  He continues to be mildly tachypneic with significant wheezing on exam and would benefit from admission.  Case discussed with hospitalist for admission.      FINAL  CLINICAL IMPRESSION(S) / ED DIAGNOSES   Final diagnoses:  COPD exacerbation (HCC)     Rx / DC Orders   ED Discharge Orders     None        Note:  This document was prepared using Dragon voice recognition software and may include unintentional dictation errors.   [name], MD 08/29/21 [phone]

## 2021-08-29 NOTE — H&P (Signed)
History and Physical    [name] VEL:[id number] DOB: January 12, 1981 DOA: 08/29/2021 DOS: the patient was seen and examined on 08/29/2021 PCP: Lydia Guiles, MD  Patient coming from: Home  Chief Complaint:  Chief Complaint  Patient presents with   Shortness of Breath   HPI: Joseph [name] is a 41 y.o. male with medical history significant of COPD with chronic respiratory failure on 2 L of oxygen, marijuana use, hypertension who presented today for worsening cough, dyspnea today in the ER on 7/17. Patient discharged on steroids but reports worsening of symptoms and thus presented here. No F/C. No N/V. NO presyncope, no CP/palpitations. Reports coughing, no improvement from steroid therapy.   Recurrent Hospitalizations for similar complain: 6/25-6/26, 7/9-7/12, 7/17- He has remained on steroids reports adherence to medication. Also reports puffiness of face, body and lower extremities. He tells me he does not move much at home. He complains of weakness and fatigue with movement.  In the ER- Patient afebrile, 98.2 F, RR 22, HR 95, BP 163/114. SPO2 99% at 2 L [place]. He does not appear short of breath.  CT Chest- 1. Circumferential wall thickening and short-segment occlusion predominantly affecting the bilateral lower lobe segmental and subsegmental bronchi most suggestive of airways disease with suspected developing areas of potential airways disseminated infection within the right upper lobe. Further evaluation with bronchoscopy could be performed as indicated. Follow-up chest CT after resolution of acute symptoms could be performed to ensure complete resolution and/or to establish a new baseline. 2.  Emphysema (ICD10-J43.9). 3. Multiple old rib fractures seen bilaterally, right greater than left, several of which demonstrates pseudoarthroses.  CXR -No active cardiopulmonary disease.   BMP- normal, BUN slightly elevated BNP normal CBC- Leukocytosis 28K, platelets elevated   Review  of Systems: As mentioned in the history of present illness. All other systems reviewed and are negative. Past Medical History:  Diagnosis Date   Asthma    COPD (chronic obstructive pulmonary disease) (HCC)    Eczema    Hypertension    History reviewed. No pertinent surgical history. Social History:  reports that he has been smoking. He has never used smokeless tobacco. He reports current drug use. Drug: Marijuana. He reports that he does not drink alcohol.  Allergies  Allergen Reactions   Penicillins Shortness Of Breath   Pork-Derived Products Nausea Only   Banana Hives    Family History  Problem Relation Age of Onset   Hypertension Mother     Prior to Admission medications   Medication Sig Start Date End Date Taking? Authorizing Provider  albuterol (PROVENTIL HFA;VENTOLIN HFA) 108 (90 Base) MCG/ACT inhaler Inhale 2 puffs into the lungs every 6 (six) hours as needed for wheezing or shortness of breath.    [provider]  albuterol (PROVENTIL) (2.5 MG/3ML) 0.083% nebulizer solution Take 2.5 mg by nebulization every 6 (six) hours as needed for wheezing or shortness of breath.    [provider]  dupilumab (DUPIXENT) 300 MG/2ML prefilled syringe Inject 300 mg into the skin every 14 (fourteen) days.    [provider]  fluticasone-salmeterol (ADVAIR HFA) 230-21 MCG/ACT inhaler Inhale 2 puffs into the lungs every 12 (twelve) hours.    [provider]  hydrochlorothiazide (HYDRODIURIL) 25 MG tablet Take 25 mg by mouth daily. 09/24/20   [provider]  ibuprofen (ADVIL) 800 MG tablet Take 800 mg by mouth 3 (three) times daily as needed for pain.    [provider]  montelukast (SINGULAIR) 10 MG tablet Take  10 mg by mouth at bedtime.    [provider]  omeprazole (PRILOSEC) 20 MG capsule Take 20 mg by mouth daily as needed (GERD symptoms).    [provider]  predniSONE (DELTASONE) 2.5 MG tablet Take 7.5 mg by mouth  daily.    [provider]  predniSONE (DELTASONE) 20 MG tablet Take 2 tablets (40 mg total) by mouth daily with breakfast for 3 days, THEN 1 tablet (20 mg total) daily with breakfast for 3 days, THEN 0.5 tablets (10 mg total) daily with breakfast for 3 days. 08/25/21 09/03/21  Marrion Coy, MD  sildenafil (REVATIO) 20 MG tablet Take 60 mg by mouth daily as needed (ED symptoms).    [provider]  tiZANidine (ZANAFLEX) 2 MG tablet Take 2 mg by mouth 3 (three) times daily. Patient not taking: Reported on 08/21/2021    [provider]  tobramycin (TOBREX) 0.3 % ophthalmic ointment Place into the right eye 4 (four) times daily for 10 days. Place a 1/2 inch ribbon of ointment into the lower eyelid 08/26/21 09/05/21  Tommi Rumps, [name]    Physical Exam: Vitals:   08/29/21 1000 08/29/21 1030 08/29/21 1045 08/29/21 1130  BP: (!) 150/110 (!) 165/121  (!) 143/99  Pulse: 99 (!) 103 (!) 102 98  Resp: (!) 23 (!) 28 (!) 23 (!) 23  Temp:      SpO2: 96% 95% 95% 90%   General exam: Appears calm and comfortable  Respiratory system: Decreased breath sounds with wheezes. Respiratory effort normal. Cardiovascular system: S1 & S2 heard, RRR. No JVD, murmurs, rubs, gallops or clicks. No pedal edema. Gastrointestinal system: Abdomen is nondistended, soft and nontender. No organomegaly or masses felt. Normal bowel sounds heard. Central nervous system: Alert and oriented. No focal neurological deficits. Extremities: Symmetric 5 x 5 power. Skin: No rashes, lesions or ulcers Psychiatry: Judgement and insight appear normal. Mood & affect appropriate.   Data Reviewed:  Results are pending, will review when available.  Assessment and Plan: COPD with acute bronchitis (HCC) H/o Bronchospasm, chronic wheezing.  ?OSA - chronic 2L [place], usually per patient uses it PRN, but has been using more regularly - admit, med tele, IV steroids, ?antimicrobials, will test viral panel. - Pulmonology  evaluation given recurrent admissions  - no evidence of CHF exacerbation at this time. 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 Fortunately he is stable on 2L [place], will request Pulm review of care plan and recommendations. Messaged Dr. Karna Christmas    Lymphedema of both lower extremities Low suspicion for DVT but no recent imaging, and given immobility at  Home, will scan [place] BLE DVT protocol. Likely secondary to long term steroid use, weakness, immobility. PT eval for mobility, lymphedema May benefit from outpatient vascular evaluation. BNP not elevated  Leukocytosis Does not appear to have systemic infection burden. He is subacute steroids, leukocytosis secondary to oral steroid ingestion  Essential hypertension Continue HCTZ  Chronic respiratory failure (HCC) On 2L [place], currently also on 2L [place]      Advance Care Planning:   Code Status: Prior Full Code  Consults: Pulmonology  Family Communication: Family at bedside  Severity of Illness: The appropriate patient status for this patient is INPATIENT. Inpatient status is judged to be reasonable and necessary in order to provide the required intensity of service to ensure the patient's safety. The patient's presenting symptoms, physical exam findings, and initial radiographic and laboratory data in the context  of their chronic comorbidities is felt to place them at high risk for further clinical deterioration. Furthermore, it is not anticipated that the patient will be medically stable for discharge from the hospital within 2 midnights of admission.   * I certify that at the point of admission it is my clinical judgment that the patient will require inpatient hospital care spanning beyond 2 midnights from the point of admission due to high intensity of service, high risk for further deterioration and high frequency of surveillance  required.*  Author: Thomas Hoff, MD 08/29/2021 1:31 PM  For on call review [email or web address].

## 2021-08-30 ENCOUNTER — Encounter: Payer: Self-pay | Admitting: General Practice

## 2021-08-30 DIAGNOSIS — I890 Lymphedema, not elsewhere classified: Secondary | ICD-10-CM

## 2021-08-30 DIAGNOSIS — J9611 Chronic respiratory failure with hypoxia: Secondary | ICD-10-CM

## 2021-08-30 DIAGNOSIS — I10 Essential (primary) hypertension: Secondary | ICD-10-CM

## 2021-08-30 DIAGNOSIS — R051 Acute cough: Secondary | ICD-10-CM

## 2021-08-30 DIAGNOSIS — J440 Chronic obstructive pulmonary disease with acute lower respiratory infection: Secondary | ICD-10-CM

## 2021-08-30 DIAGNOSIS — D72829 Elevated white blood cell count, unspecified: Secondary | ICD-10-CM

## 2021-08-30 DIAGNOSIS — J209 Acute bronchitis, unspecified: Secondary | ICD-10-CM

## 2021-08-30 LAB — HIV ANTIBODY (ROUTINE TESTING W REFLEX): HIV Screen 4th Generation wRfx: NONREACTIVE

## 2021-08-30 LAB — PROCALCITONIN: Procalcitonin: <0.1 ng/mL

## 2021-08-30 LAB — TROPONIN I (HIGH SENSITIVITY)
Troponin I (High Sensitivity): 12 ng/L (ref <18)
Troponin I (High Sensitivity): 14 ng/L (ref <18)

## 2021-08-30 MED ORDER — TOBRAMYCIN 0.3 % OP SOLN
1.0000 [drp] | Freq: Four times a day (QID) | OPHTHALMIC | Status: DC
  Administered 2021-08-30: 1 [drp] via OPHTHALMIC
  Administered 2021-08-31 – 2021-09-01 (×6): 2 [drp] via OPHTHALMIC
  Administered 2021-09-01: 1 [drp] via OPHTHALMIC
  Administered 2021-09-01 – 2021-09-02 (×3): 2 [drp] via OPHTHALMIC
  Filled 2021-08-30: qty 5

## 2021-08-30 MED ORDER — METOPROLOL TARTRATE 5 MG/5ML IV SOLN
5.0000 mg | Freq: Four times a day (QID) | INTRAVENOUS | Status: DC | PRN
  Administered 2021-08-30: 5 mg via INTRAVENOUS

## 2021-08-30 MED ORDER — ENOXAPARIN SODIUM 60 MG/0.6ML IJ SOSY
0.5000 mg/kg | PREFILLED_SYRINGE | INTRAMUSCULAR | Status: DC
Start: 2021-08-30 — End: 2021-09-02
  Administered 2021-08-30 – 2021-08-31 (×2): 45 mg via SUBCUTANEOUS
  Filled 2021-08-30 (×3): qty 0.6

## 2021-08-30 MED ORDER — LABETALOL HCL 5 MG/ML IV SOLN
20.0000 mg | INTRAVENOUS | Status: DC | PRN
  Administered 2021-08-30 – 2021-08-31 (×2): 20 mg via INTRAVENOUS
  Filled 2021-08-30 (×2): qty 4

## 2021-08-30 MED ORDER — NITROGLYCERIN 0.4 MG SL SUBL
SUBLINGUAL_TABLET | SUBLINGUAL | Status: AC
  Filled 2021-08-30: qty 1

## 2021-08-30 MED ORDER — FUROSEMIDE 10 MG/ML IJ SOLN
40.0000 mg | Freq: Once | INTRAMUSCULAR | Status: AC
  Administered 2021-08-30: 40 mg via INTRAVENOUS
  Filled 2021-08-30: qty 4

## 2021-08-30 MED ORDER — LOSARTAN POTASSIUM 25 MG PO TABS
25.0000 mg | ORAL_TABLET | Freq: Every day | ORAL | Status: DC
  Administered 2021-08-30 – 2021-08-31 (×2): 25 mg via ORAL
  Filled 2021-08-30 (×2): qty 1

## 2021-08-30 MED ORDER — HYDRALAZINE HCL 20 MG/ML IJ SOLN
10.0000 mg | Freq: Four times a day (QID) | INTRAMUSCULAR | Status: DC | PRN
  Administered 2021-08-30 (×2): 10 mg via INTRAVENOUS
  Filled 2021-08-30 (×3): qty 1

## 2021-08-30 MED ORDER — NITROGLYCERIN 0.4 MG SL SUBL
0.4000 mg | SUBLINGUAL_TABLET | SUBLINGUAL | Status: DC | PRN
  Administered 2021-08-30: 0.4 mg via SUBLINGUAL

## 2021-08-30 NOTE — Progress Notes (Signed)
Nutrition Brief Note  Patient identified on the Malnutrition Screening Tool (MST) Report  Wt Readings from Last 15 Encounters:  08/30/21 89.5 kg  08/26/21 86.2 kg  08/21/21 86.2 kg  08/07/21 86.2 kg  01/28/21 72.6 kg  12/10/20 83 kg  08/01/20 63.5 kg  12/07/17 70.7 kg  03/27/17 72.6 kg   Pt with medical history significant of COPD with chronic respiratory failure on 2 L of oxygen, marijuana use, hypertension who presented for worsening cough, dyspnea  Pt admitted with COPD with acute bronchitis.   Pt sleeping soundly at time of visit. No family at bedside.   Nutrition-Focused physical exam completed. Findings are no fat depletion, no muscle depletion, and mild edema.     Reviewed wt hx; no wt loss noted over the past 5 months.   Medications reviewed and include prednisone.   Labs reviewed: CBGS: 83.   Body mass index is 32.83 kg/m. Patient meets criteria for obesity, class I based on current BMI. Obesity is a complex, chronic medical condition that is optimally managed by a multidisciplinary care team. Weight loss is not an ideal goal for an acute inpatient hospitalization. However, if further work-up for obesity is warranted, consider outpatient referral to outpatient bariatric service and/or [facility].    Current diet order is Heart Healthy (liberalize to 2 gram sodium- pt follows low salt diet at home), patient is consuming approximately n/a% of meals at this time. Labs and medications reviewed.   No nutrition interventions warranted at this time. If nutrition issues arise, please consult RD.   Levada Schilling, RD, LDN, CDCES Registered Dietitian II Certified Diabetes Care and Education Specialist Please refer to [facility] for RD and/or RD on-call/weekend/after hours pager

## 2021-08-30 NOTE — Significant Event (Signed)
Rapid Response Event Note   Reason for Call :   Elevated Heartrate Initial Focused Assessment:   Patient complaining of left chest pain- rating it a 8 out of 10 squeezing pain- radiating to his left back/shoulder.  Vitals HR 150's and BP systolic 150's.      Interventions:  1 nitro tablet given, 5mg  of metoprolol given and EKG performed. Troponin also ordered. EKG showed NSR- after meds- patient's pain level is 0 and blood pressure- 140's systolic and HR 95.    Plan of Care:     Event Summary:   MD Notified: Dr. Call Time:1430 Arrival Time:MD not at bedside End Time:  Tyson Babinski, RN

## 2021-08-30 NOTE — Evaluation (Signed)
Physical Therapy Evaluation Patient Details Name: Joseph [name] MRN: [id number] DOB: Aug 25, 1980 Today's Date: 08/30/2021  History of Present Illness  Patient is a 41 year old with  41 y.o. male with medical history significant of COPD with chronic respiratory failure on 2 L of oxygen, marijuana use, hypertension who presented today for worsening cough, dyspnea. Recurrent hospitilizations for similar. He complains of weakness and fatigue with movement. Negative for sonographic evidence of DVT in the bilateral lower extremities  Clinical Impression  Patient is agreeable to PT evaluation. He reports using 2-4L 02 at home at baseline and using the cane periodically when feeling poorly. He complains of LE edema and mild generalized weakness of BLE.  He did not require physical assistance with mobility today. He ambulated in hallway without assistive device with decreased velocity and increased work of breathing/fatigue with activity. Sp02 was 96% on 3 L02 after walking. Patient educated on energy conservation techniques. Encouraged frequent routine mobility and LE exercises to assist with edema management of LE. Recommend to continue PT to maximize independence.      Recommendations for follow up therapy are one component of a multi-disciplinary discharge planning process, led by the attending physician.  Recommendations may be updated based on patient status, additional functional criteria and insurance authorization.  Follow Up Recommendations Outpatient PT      Assistance Recommended at Discharge PRN  Patient can return home with the following  Assist for transportation;Help with stairs or ramp for entrance    Equipment Recommendations None recommended by PT  Recommendations for Other Services       Functional Status Assessment Patient has had a recent decline in their functional status and demonstrates the ability to make significant improvements in function in a reasonable and predictable  amount of time.     Precautions / Restrictions Precautions Precautions: Fall Restrictions Weight Bearing Restrictions: No      Mobility  Bed Mobility Overal bed mobility: Modified Independent                  Transfers Overall transfer level: Needs assistance   Transfers: Sit to/from Stand Sit to Stand: Supervision           General transfer comment: supervision for safety    Ambulation/Gait Ambulation/Gait assistance: Supervision Gait Distance (Feet): 120 Feet Assistive device: None Gait Pattern/deviations: Decreased stance time - left, Decreased weight shift to left Gait velocity: decreased     General Gait Details: patient ambulated in hallway with supervision for safety. he had his cane in the room but declined using it at this time. patient with increased work of breathing and fatigued during activity. Sp02 96% after walking on 3 L02.  Stairs            Wheelchair Mobility    Modified Rankin (Stroke Patients Only)       Balance Overall balance assessment: Needs assistance Sitting-balance support: Feet supported Sitting balance-Leahy Scale: Good     Standing balance support: No upper extremity supported Standing balance-Leahy Scale: Fair Standing balance comment: supervision for safety                             Pertinent Vitals/Pain Pain Assessment Pain Assessment: No/denies pain    Home Living Family/patient expects to be discharged to:: Private residence Living Arrangements: Spouse/significant other Available Help at Discharge: Family;Available PRN/intermittently             Home Equipment: Gilmer Mor - single point;Shower  seat Additional Comments: 2-4L 02 at baseline    Prior Function Prior Level of Function : Independent/Modified Independent             Mobility Comments: intermittent use of cane when not feeling well       Hand Dominance        Extremity/Trunk Assessment   Upper Extremity  Assessment Upper Extremity Assessment: Overall WFL for tasks assessed    Lower Extremity Assessment Lower Extremity Assessment: RLE deficits/detail;LLE deficits/detail RLE Deficits / Details: generalized weakness, edema noted RLE Sensation: decreased light touch (distally) LLE Deficits / Details: generalized weakness, edema noted LLE Sensation: decreased light touch (distally)       Communication      Cognition Arousal/Alertness: Awake/alert Behavior During Therapy: WFL for tasks assessed/performed Overall Cognitive Status: Within Functional Limits for tasks assessed                                 General Comments: patient is agreeable to PT.        General Comments General comments (skin integrity, edema, etc.): discussed energy conservation techniques. encouraged patient to elevate LE, perform ankle pumps, and routinely mobilize for LE edema management. Noted PT order to assess for lymphedema wrapping- recommend to follow up with outpatient PT if this is of concern    Exercises     Assessment/Plan    PT Assessment Patient needs continued PT services  PT Problem List Cardiopulmonary status limiting activity;Decreased mobility;Decreased balance;Decreased activity tolerance;Decreased strength       PT Treatment Interventions Gait training;DME instruction;Stair training;Functional mobility training;Therapeutic activities;Therapeutic exercise;Balance training;Neuromuscular re-education;Cognitive remediation;Patient/family education    PT Goals (Current goals can be found in the Care Plan section)  Acute Rehab PT Goals Patient Stated Goal: to return home PT Goal Formulation: With patient Time For Goal Achievement: 09/13/21 Potential to Achieve Goals: Good    Frequency Min 2X/week     Co-evaluation               AM-PAC PT "6 Clicks" Mobility  Outcome Measure Help needed turning from your back to your side while in a flat bed without using  bedrails?: None Help needed moving from lying on your back to sitting on the side of a flat bed without using bedrails?: None Help needed moving to and from a bed to a chair (including a wheelchair)?: A Little Help needed standing up from a chair using your arms (e.g., wheelchair or bedside chair)?: A Little Help needed to walk in hospital room?: A Little Help needed climbing 3-5 steps with a railing? : A Little 6 Click Score: 20    End of Session Equipment Utilized During Treatment: Gait belt;Oxygen Activity Tolerance: Patient tolerated treatment well Patient left: in bed (sitting up eating breakfast) Nurse Communication: Mobility status PT Visit Diagnosis: Unsteadiness on feet (R26.81);Muscle weakness (generalized) (M62.81)    Time: 0277-4128 PT Time Calculation (min) (ACUTE ONLY): 28 min   Charges:   PT eval:1 Low PT treatments: Therapeutic Activity 8-22 min       Donna Bernard, PT, MPT   Ina Homes 08/30/2021, 1:04 PM

## 2021-08-30 NOTE — Hospital Course (Addendum)
Joseph [name] is a 41 y.o. male with medical history significant of COPD with chronic respiratory failure on 2 L of oxygen, marijuana use, hypertension presented to hospital with cough dyspnea and no improvement in his symptoms despite course of steroids.  Patient had been given course of steroids from the ED and has had recurrent hospitalization at least 3 times over the last month.  Patient also complained of weakness fatigue.  In the ED blood pressure was slightly elevated.  CBC showed mildly elevated leukocytosis.  BMP within normal limits including BNP.  CT scan of the chest showed circumferential wall thickening and short segment occlusion predominantly affecting the bilateral lower lobe segmental and subsegmental bronchi suggestive airway disease with potential for infection.  Emphysema was noted with multiple old rib fractures.  Patient was then admitted hospital for further evaluation and treatment.  Assessment and plan. Principal Problem:   Cough Active Problems:   COPD with acute bronchitis (HCC)   Chronic respiratory failure (HCC)   Essential hypertension   Leukocytosis   Lymphedema of both lower extremities   COPD with acute bronchitis (HCC) CT scan with subsegmental occlusion of segmental and subsegmental bronchi.  On 2 L of nasal cannula as outpatient.  Pulmonary has been consulted due to recurrent admission to the hospital.  Continue steroids.  Bronchodilators.  Respiratory viral panel negative.  Procalcitonin less than 0.1x2.  CRP and sed rate within normal range.  Legionella pneumo thinly antigen pending.  Troponin negative.   Lymphedema of both lower extremities Lower extremity duplex ultrasound requested. Continue supportive care.   Leukocytosis Likely secondary to steroids.  Continue to monitor.   Essential hypertension Continue HCTZ.  Continue to monitor blood pressure.   Chronic respiratory failure secondary to underlying COPD  (HCC) 2 L of oxygen at baseline.   Patient is currently 2 L.

## 2021-08-30 NOTE — Progress Notes (Addendum)
PROGRESS NOTE    Daoud Lobue  RSW:[id number] DOB: Jun 22, 1980 DOA: 08/29/2021 PCP: Lydia Guiles, MD    Brief Narrative:   Joseph [name] is a 41 y.o. male with medical history significant of COPD with chronic respiratory failure on 2 L of oxygen, marijuana use, hypertension presented to hospital with cough dyspnea and no improvement in his symptoms despite course of steroids.  Patient had been given course of steroids from the ED and has had recurrent hospitalization at least 3 times over the last month.  Patient also complained of weakness fatigue.  In the ED blood pressure was slightly elevated.  CBC showed mildly elevated leukocytosis.  BMP within normal limits including BNP.  CT scan of the chest showed circumferential wall thickening and short segment occlusion predominantly affecting the bilateral lower lobe segmental and subsegmental bronchi suggestive airway disease with potential for infection.  Emphysema was noted with multiple old rib fractures.  Patient was then admitted hospital for further evaluation and treatment.  Assessment and plan. Principal Problem:   Cough Active Problems:   COPD with acute bronchitis (HCC)   Chronic respiratory failure (HCC)   Essential hypertension   Leukocytosis   Lymphedema of both lower extremities     COPD with acute bronchitis (HCC) CT scan with subsegmental occlusion of segmental and subsegmental bronchi.  On 2 L of nasal cannula as outpatient.  Pulmonary has been consulted due to recurrent admission to the hospital.  Continue steroids, bronchodilators.  Still has expiratory wheezing.  Respiratory viral panel negative.  Procalcitonin less than 0.1x2.  CRP and sed rate within normal range.  Legionella pneumo antigen pending.  Troponin negative.  BNP at 26.   Lymphedema of both lower extremities Lower extremity duplex ultrasound without any evidence of DVT.  Continue supportive care.  Review of 2D echocardiogram from 08/07/2021 showing LV  ejection fraction of 55 to 60% with no regional wall motion abnormality.  We will give 1 dose of IV 40mg  of Lasix today.  BNP only at 26.  Discussed about lymphedema care with the patient's wife.  We will add a stockings.   Leukocytosis Likely secondary to steroids.  Continue to monitor.   Essential hypertension, hypertensive urgency Continue HCTZ.  Blood pressure is elevated.  Add as needed hydralazine.  Patient might benefit from addition of losartan 25 mg daily.  Will initiate.  Continue to monitor blood pressure.   Chronic respiratory failure secondary to underlying COPD  (HCC) 2 L of oxygen at baseline.  Patient is currently 2 L.  Debility, weakness.  We will get PT OT evaluation.       DVT prophylaxis:   Lovenox subcu   Code Status:     Code Status: Full code  Disposition: Home  Status is: Inpatient  Remains inpatient appropriate because: Pending clinical improvement, pulmonary evaluation,   Family Communication:  Spoke with the patient's spouse on the phone and updated her about the clinical condition of the patient  Consultants:  Pulmonary  Procedures:  None  Antimicrobials:  None  Anti-infectives (From admission, onward)    None      Subjective: Today, patient was seen and examined at bedside.  Patient complains of cough, congestion shortness of breath and wheezing.  Does not feel much improved compared to yesterday.  Patient's wife concerned about his swelling in the legs.  Objective: Vitals:   08/29/21 2133 08/30/21 0350 08/30/21 0500 08/30/21 0743  BP: (!) 166/108 (!) 145/114  (!) 163/112  Pulse: (!) 108 95  95  Resp: 20 18  16   Temp: 97.7 F (36.5 C) 97.7 F (36.5 C)  (!) 97.4 F (36.3 C)  TempSrc:    Oral  SpO2: 97% 96%  95%  Weight:   89.5 kg    No intake or output data in the 24 hours ending 08/30/21 1140 Filed Weights   08/30/21 0500  Weight: 89.5 kg    Physical Examination: Body mass index is 32.83 kg/m.   General: Obese built,  not in obvious distress, on nasal cannula oxygen at 2 L/min. HENT:   No scleral pallor or icterus noted. Oral mucosa is moist.  Chest:  Diminished breath sounds bilaterally.  Bilateral expiratory wheezing noted CVS: S1 &S2 heard. No murmur.  Regular rate and rhythm. Abdomen: Soft, nontender, nondistended.  Bowel sounds are heard.   Extremities: No cyanosis, clubbing with bilateral pitting edema peripheral pulses are palpable. Psych: Alert, awake and oriented, normal mood CNS:  No cranial nerve deficits.  Power equal in all extremities.   Skin: Warm and dry.  No rashes noted.  Data Reviewed:   CBC: Recent Labs  Lab 08/29/21 0820  WBC 28.1*  HGB 14.6  HCT 46.3  MCV 82.1  PLT 446*    Basic Metabolic Panel: Recent Labs  Lab 08/29/21 0820  NA 140  K 4.1  CL 107  CO2 28  GLUCOSE 99  BUN 26*  CREATININE 1.05  CALCIUM 8.0*    Liver Function Tests: Recent Labs  Lab 08/29/21 0853  AST 27  ALT 16  ALKPHOS 57  BILITOT 0.9  PROT 5.3*  ALBUMIN 2.5*     Radiology Studies: 08/31/21 Venous Img Lower Bilateral (DVT)  Result Date: 08/29/2021 CLINICAL DATA:  A 41 year old male presents with edema and shortness of breath. EXAM: Bilateral LOWER EXTREMITY VENOUS DOPPLER ULTRASOUND TECHNIQUE: Gray-scale sonography with compression, as well as color and duplex ultrasound, were performed to evaluate the deep venous system(s) from the level of the common femoral vein through the popliteal and proximal calf veins. COMPARISON:  None Available. FINDINGS: VENOUS Normal compressibility of the common femoral, superficial femoral, and popliteal veins, as well as the visualized calf veins. Visualized portions of profunda femoral vein and great saphenous vein unremarkable. No filling defects to suggest DVT on grayscale or color Doppler imaging. Doppler waveforms show normal direction of venous flow, normal respiratory plasticity and response to augmentation. OTHER None. Limitations: none IMPRESSION:  Negative for sonographic evidence of DVT in the bilateral lower extremities. Electronically Signed   By: 45 M.D.   On: 08/29/2021 14:15   CT CHEST W CONTRAST  Result Date: 08/29/2021 CLINICAL DATA:  Chronic cough for the past 8 weeks. Failed empiric treatment. EXAM: CT CHEST WITH CONTRAST TECHNIQUE: Multidetector CT imaging of the chest was performed during intravenous contrast administration. RADIATION DOSE REDUCTION: This exam was performed according to the departmental dose-optimization program which includes automated exposure control, adjustment of the mA and/or kV according to patient size and/or use of iterative reconstruction technique. CONTRAST:  32mL OMNIPAQUE IOHEXOL 300 MG/ML  SOLN COMPARISON:  Chest radiograph-08/29/2021; 08/21/2021; 08/07/2021 FINDINGS: Cardiovascular: Normal heart size. Trace amount of pericardial fluid, presumably physiologic. No evidence of thoracic aortic aneurysm or dissection on this nongated examination. Although this examination was not tailored for the evaluation the pulmonary arteries, there are no discrete filling defects within the central pulmonary arterial tree to suggest central pulmonary embolism. Normal caliber of the main pulmonary artery. Mediastinum/Nodes: No bulky mediastinal, hilar or axillary lymph adenopathy. Lungs/Pleura: Moderate apical predominant  centrilobular and paraseptal emphysematous change. Nonocclusive debris is seen within the main tracheal air column. There is circumferential wall thickening and short-segment occlusion predominantly involving the bilateral lower lobe segmental and subsegmental bronchi. Scattered ill-defined opacities are seen within the right upper lobe with dominant ill-defined consolidative opacity seen within the right upper lobe measuring approximally 1.8 x 1.7 x 1.6 cm (axial image 36, series 3; coronal image 86, series 5) with smaller consolidative opacities seen on images 46 and 72, series 3). Minimal right  lower lobe subsegmental atelectasis, potentially the sequela of previous multiple right-sided rib fractures. No air bronchograms. No pleural effusion or pneumothorax. Punctate granuloma within the right upper lobe (image 40, series 3). No discrete worrisome pulmonary nodules given background parenchymal abnormalities. Upper Abdomen: Limited early arterial phase evaluation of the upper abdomen is unremarkable. Musculoskeletal: Old rib fractures are seen bilaterally including the right sixth through eleventh ribs as well as the left ninth rib, several of which contain pseudoarthroses. Increased sclerosis are noted about several thoracic vertebral body endplates. DDD of C6-C7 with disc space height loss endplate irregularity and sclerosis. Regional soft tissues are normal. Normal appearance of the thyroid gland. IMPRESSION: 1. Circumferential wall thickening and short-segment occlusion predominantly affecting the bilateral lower lobe segmental and subsegmental bronchi most suggestive of airways disease with suspected developing areas of potential airways disseminated infection within the right upper lobe. Further evaluation with bronchoscopy could be performed as indicated. Follow-up chest CT after resolution of acute symptoms could be performed to ensure complete resolution and/or to establish a new baseline. 2.  Emphysema (ICD10-J43.9). 3. Multiple old rib fractures seen bilaterally, right greater than left, several of which demonstrates pseudoarthroses. Electronically Signed   By: Simonne Come M.D.   On: 08/29/2021 12:19   DG Chest 2 View  Result Date: 08/29/2021 CLINICAL DATA:  Shortness of breath.  Ankle swelling. EXAM: CHEST - 2 VIEW COMPARISON:  None Available. FINDINGS: Cardiac silhouette and mediastinal contours are within normal limits. The lungs are clear. No pleural effusion or pneumothorax. Mild multilevel degenerative disc changes of the thoracic spine. Old healed fractures are again seen within the  posterior right sixth through ninth ribs. IMPRESSION: No active cardiopulmonary disease. Electronically Signed   By: Neita Garnet M.D.   On: 08/29/2021 08:33      LOS: 1 day    Joycelyn Das, MD [facility] Available via Epic secure chat 7am-7pm After these hours, please refer to coverage provider listed on [email or web address] 08/30/2021, 11:40 AM

## 2021-08-30 NOTE — Progress Notes (Signed)
Dr. Arville Care notified on patient latest VS, patient is sitting on bed, no chest pain and SOB.

## 2021-08-30 NOTE — Progress Notes (Signed)
Assume care of pt at 1338. Pt is a yellow mews with elevated blood pressure and heart rate. MD aware

## 2021-08-30 NOTE — Progress Notes (Signed)
Pt in room with elevated heart rate 130's-150's and systolic BP 140's-150's. Pt 's face was red and sweaty. Rapid response nurse was called to assess pt. When she came to room pt c/o left chest pain radiating to his back. Pain was relieved with  nitro. EKG done which showed NSR. IV metoprolol, lasix and troponins was ordered.

## 2021-08-30 NOTE — Evaluation (Signed)
Occupational Therapy Evaluation Patient Details Name: Joseph [name] MRN: [id number] DOB: Jan 21, 1981 Today's Date: 08/30/2021   History of Present Illness Patient is a 41 year old with  41 y.o. male with medical history significant of COPD with chronic respiratory failure on 2 L of oxygen, marijuana use, hypertension who presented today for worsening cough, dyspnea. Recurrent hospitilizations for similar. He complains of weakness and fatigue with movement. Negative for sonographic evidence of DVT in the bilateral lower extremities   Clinical Impression   Patient presenting with decreased Ind in self care, balance, functional mobility, endurance, strength, and safety awareness. Patient reports living at home with wife and his in-laws at this time. Pt reports use of SPC for mobility when he feels more weak or is having a "bad day". Chronically on 2Ls at home and reports occasional assistance needed for LB dressing and he shares IADL responsibilities with wife who works outside of the home.  Patient currently functioning at supervision level overall and able to manage O2 line in room. Pt would benefit from energy conservation strategies. Patient will benefit from acute OT to increase overall independence in the areas of ADLs, functional mobility, and safety awareness in order to safely discharge home.      Recommendations for follow up therapy are one component of a multi-disciplinary discharge planning process, led by the attending physician.  Recommendations may be updated based on patient status, additional functional criteria and insurance authorization.   Follow Up Recommendations  No OT follow up    Assistance Recommended at Discharge PRN  Patient can return home with the following A lot of help with bathing/dressing/bathroom;Assistance with cooking/housework;Assist for transportation;Help with stairs or ramp for entrance    Functional Status Assessment  Patient has had a recent decline in  their functional status and demonstrates the ability to make significant improvements in function in a reasonable and predictable amount of time.  Equipment Recommendations  None recommended by OT       Precautions / Restrictions Precautions Precautions: Fall Restrictions Weight Bearing Restrictions: No      Mobility Bed Mobility Overal bed mobility: Modified Independent                  Transfers Overall transfer level: Needs assistance   Transfers: Sit to/from Stand Sit to Stand: Supervision           General transfer comment: supervision for safety      Balance Overall balance assessment: Needs assistance Sitting-balance support: Feet supported Sitting balance-Leahy Scale: Good     Standing balance support: No upper extremity supported Standing balance-Leahy Scale: Fair Standing balance comment: supervision for safety                           ADL either performed or assessed with clinical judgement   ADL Overall ADL's : Needs assistance/impaired                                       General ADL Comments: Supervision overall for functional mobility and self care needs     Vision Baseline Vision/History: 0 No visual deficits              Pertinent Vitals/Pain Pain Assessment Pain Assessment: No/denies pain     Hand Dominance     Extremity/Trunk Assessment Upper Extremity Assessment Upper Extremity Assessment: Overall WFL for tasks assessed  Lower Extremity Assessment Lower Extremity Assessment: Defer to PT evaluation RLE Deficits / Details: generalized weakness, edema noted RLE Sensation: decreased light touch (distally) LLE Deficits / Details: generalized weakness, edema noted LLE Sensation: decreased light touch (distally)       Communication Communication Communication: No difficulties   Cognition Arousal/Alertness: Awake/alert Behavior During Therapy: WFL for tasks assessed/performed Overall  Cognitive Status: Within Functional Limits for tasks assessed                                 General Comments: Pt is pleasant and cooperative overall     General Comments  discussed energy conservation techniques. encouraged patient to elevate LE, perform ankle pumps, and routinely mobilize for LE edema management. Noted PT order to assess for lymphedema wrapping- recommend to follow up with outpatient PT if this is of concern    Exercises     Shoulder Instructions      Home Living Family/patient expects to be discharged to:: Private residence Living Arrangements: Spouse/significant other Available Help at Discharge: Family;Available PRN/intermittently Type of Home: House Home Access: Stairs to enter     Home Layout: One level     Bathroom Shower/Tub: Producer, television/film/video: Standard     Home Equipment: Cane - single point;Shower seat   Additional Comments: 2-4L 02 at baseline      Prior Functioning/Environment Prior Level of Function : Independent/Modified Independent             Mobility Comments: intermittent use of cane when not feeling well ADLs Comments: assistance at times for socks and shoes and they perform IADLs together        OT Problem List: Cardiopulmonary status limiting activity;Decreased safety awareness;Decreased activity tolerance      OT Treatment/Interventions: Self-care/ADL training;Therapeutic exercise;Therapeutic activities;Energy conservation;Patient/family education;Balance training    OT Goals(Current goals can be found in the care plan section) Acute Rehab OT Goals Patient Stated Goal: to be strong enough to walk outside again OT Goal Formulation: With patient Time For Goal Achievement: 09/13/21 Potential to Achieve Goals: Fair ADL Goals Pt Will Perform Grooming: with modified independence Pt Will Perform Lower Body Dressing: with modified independence;sit to/from stand Pt Will Transfer to Toilet: with  modified independence;ambulating Pt Will Perform Toileting - Clothing Manipulation and hygiene: with modified independence;sit to/from stand Pt Will Perform Tub/Shower Transfer: with supervision Additional ADL Goal #1: Pt will demonstrate and verbalized energy conservation strategies during self care tasks independently.  OT Frequency: Min 2X/week       AM-PAC OT "6 Clicks" Daily Activity     Outcome Measure Help from another person eating meals?: None Help from another person taking care of personal grooming?: None Help from another person toileting, which includes using toliet, bedpan, or urinal?: None Help from another person bathing (including washing, rinsing, drying)?: None Help from another person to put on and taking off regular upper body clothing?: None Help from another person to put on and taking off regular lower body clothing?: None 6 Click Score: 24   End of Session Equipment Utilized During Treatment: Oxygen Nurse Communication: Mobility status  Activity Tolerance: Patient tolerated treatment well Patient left: in bed;with call bell/phone within reach  OT Visit Diagnosis: Muscle weakness (generalized) (M62.81)                Time: 4098-1191 OT Time Calculation (min): 21 min Charges:  OT General Charges $OT Visit: 1 Visit  OT Evaluation $OT Eval Low Complexity: 1 Low OT Treatments $Self Care/Home Management : 8-22 mins Darleen Crocker, MS, OTR/L , CBIS ascom [phone]  08/30/21, 2:11 PM

## 2021-08-30 NOTE — Clinical Social Work Note (Signed)
Occupational Therapy * Physical Therapy * Speech Therapy          DATE __7/18/23_________________ PATIENT NAME__[name]_____ PATIENT MRN___[id number]_________  DIAGNOSIS/DIAGNOSIS CODE _Cough R05.9, Lymphendema of both lower extremities I89.0_____________________ DATE OF DISCHARGE: ___07/19/23___________  PRIMARY CARE PHYSICIAN ____Amanda Isebll__ PCP PHONE/FAX____[phone]________     Dear Provider (Name: __________________   Fax: ___________________________):   I certify that I have examined this patient and that occupational/physical/speech therapy is necessary on an outpatient basis.    The patient has expressed interest in completing their recommended course of therapy at your location.  Once a formal order from the patient's primary care physician has been obtained, please contact him/her to schedule an appointment for evaluation at your earliest convenience.   [  X]  Physical Therapy Evaluate and Treat          [  X]  Occupational Therapy Evaluate and Treat                                    [  ]  Speech Therapy Evaluate and Treat       The patient's primary care physician (listed above) must furnish and be responsible for a formal order such that the recommended services may be furnished while under the primary physician's care, and that the plan of care will be established and reviewed every 30 days (or more often if condition necessitates).

## 2021-08-30 NOTE — Plan of Care (Signed)
  Problem: Education: Goal: Knowledge of General Education information will improve Description: Including pain rating scale, medication(s)/side effects and non-pharmacologic comfort measures Outcome: Progressing   Problem: Clinical Measurements: Goal: Diagnostic test results will improve Outcome: Progressing Goal: Respiratory complications will improve Outcome: Progressing Goal: Cardiovascular complication will be avoided Outcome: Progressing   Problem: Activity: Goal: Risk for activity intolerance will decrease Outcome: Progressing   Problem: Nutrition: Goal: Adequate nutrition will be maintained Outcome: Progressing   Problem: Elimination: Goal: Will not experience complications related to bowel motility Outcome: Progressing Goal: Will not experience complications related to urinary retention Outcome: Progressing

## 2021-08-30 NOTE — Plan of Care (Signed)
  Problem: Education: Goal: Knowledge of General Education information will improve Description: Including pain rating scale, medication(s)/side effects and non-pharmacologic comfort measures Outcome: Progressing   Problem: Health Behavior/Discharge Planning: Goal: Ability to manage health-related needs will improve Outcome: Progressing   Problem: Clinical Measurements: Goal: Respiratory complications will improve Outcome: Progressing   Problem: Clinical Measurements: Goal: Will remain free from infection Outcome: Progressing   Problem: Activity: Goal: Risk for activity intolerance will decrease Outcome: Progressing   Problem: Skin Integrity: Goal: Risk for impaired skin integrity will decrease Outcome: Progressing

## 2021-08-30 NOTE — Progress Notes (Signed)
   08/30/21 1305  Assess: MEWS Score  Temp 98.4 F (36.9 C)  BP (!) 160/110  MAP (mmHg) 139  Pulse Rate (!) 130  Resp 17  Level of Consciousness Alert  SpO2 93 %  O2 Device Nasal Cannula  O2 Flow Rate (L/min) 2 L/min  Assess: MEWS Score  MEWS Temp 0  MEWS Systolic 0  MEWS Pulse 3  MEWS RR 0  MEWS LOC 0  MEWS Score 3  MEWS Score Color Yellow  Assess: if the MEWS score is Yellow or Red  Were vital signs taken at a resting state? Yes  Focused Assessment No change from prior assessment  Does the patient meet 2 or more of the SIRS criteria? No  MEWS guidelines implemented *See Row Information* Yes  Treat  Pain Scale 0-10  Pain Score 0  Take Vital Signs  Increase Vital Sign Frequency  Yellow: Q 2hr X 2 then Q 4hr X 2, if remains yellow, continue Q 4hrs  Escalate  MEWS: Escalate Yellow: discuss with charge nurse/RN and consider discussing with provider and RRT  Notify: Charge Nurse/RN  Name of Charge Nurse/RN Notified Charlann Boxer RN  Date Charge Nurse/RN Notified 08/30/21  Time Charge Nurse/RN Notified 1321  Notify: Provider  Provider Name/Title Dr. Tyson Babinski  Date Provider Notified 08/30/21  Time Provider Notified 1305  Method of Notification Page  Notification Reason Other (Comment) (Elevating HR and elevating BP)  Provider response See new orders;Other (Comment) (MD also contacting Pt in room)  Date of Provider Response 08/30/21  Time of Provider Response 1322  Document  Patient Outcome Other (Comment) (MD aware. Remains in department)  Progress note created (see row info) Yes  Assess: SIRS CRITERIA  SIRS Temperature  0  SIRS Pulse 1  SIRS Respirations  0  SIRS WBC 1  SIRS Score Sum  2

## 2021-08-31 DIAGNOSIS — J440 Chronic obstructive pulmonary disease with acute lower respiratory infection: Secondary | ICD-10-CM | POA: Diagnosis not present

## 2021-08-31 DIAGNOSIS — J209 Acute bronchitis, unspecified: Secondary | ICD-10-CM | POA: Diagnosis not present

## 2021-08-31 LAB — DRUG SCREEN (9) + CREATININE, UR
Amphetamine Screen, Urine: NEGATIVE ng/mL
BENZODIAZ UR QL: NEGATIVE ng/mL
Barbiturate, Ur: NEGATIVE ng/mL
Cocaine (Metab.), Urine: NEGATIVE ng/mL
Creatinine, Urine: 29.4 mg/dL (ref 20.0–300.0)
Methadone Screen, Urine: NEGATIVE ng/mL
OPIATE SCREEN URINE: NEGATIVE ng/mL
Oxycodone+Oxymorphone Ur Ql Scn: NEGATIVE ng/mL
PCP, Urine: NEGATIVE ng/mL
Propoxyphene, Urine: NEGATIVE ng/mL
pH, Urine: 6.3 (ref 4.5–8.9)

## 2021-08-31 LAB — BASIC METABOLIC PANEL
Anion gap: 7 (ref 5–15)
BUN: 39 mg/dL — ABNORMAL HIGH (ref 6–20)
CO2: 32 mmol/L (ref 22–32)
Calcium: 8 mg/dL — ABNORMAL LOW (ref 8.9–10.3)
Chloride: 99 mmol/L (ref 98–111)
Creatinine, Ser: 1.08 mg/dL (ref 0.61–1.24)
GFR, Estimated: >60 mL/min (ref >60)
Glucose, Bld: 121 mg/dL — ABNORMAL HIGH (ref 70–99)
Potassium: 4 mmol/L (ref 3.5–5.1)
Sodium: 138 mmol/L (ref 135–145)

## 2021-08-31 LAB — CBC
HCT: 45.3 % (ref 39.0–52.0)
Hemoglobin: 14.4 g/dL (ref 13.0–17.0)
MCH: 25.3 pg — ABNORMAL LOW (ref 26.0–34.0)
MCHC: 31.8 g/dL (ref 30.0–36.0)
MCV: 79.6 fL — ABNORMAL LOW (ref 80.0–100.0)
Platelets: 429 10*3/uL — ABNORMAL HIGH (ref 150–400)
RBC: 5.69 MIL/uL (ref 4.22–5.81)
RDW: 18.1 % — ABNORMAL HIGH (ref 11.5–15.5)
WBC: 30.7 10*3/uL — ABNORMAL HIGH (ref 4.0–10.5)
nRBC: 0 % (ref 0.0–0.2)

## 2021-08-31 LAB — LEGIONELLA PNEUMOPHILA SEROGP 1 UR AG: L. pneumophila Serogp 1 Ur Ag: NEGATIVE

## 2021-08-31 LAB — MAGNESIUM: Magnesium: 2.4 mg/dL (ref 1.7–2.4)

## 2021-08-31 MED ORDER — LOSARTAN POTASSIUM 50 MG PO TABS
100.0000 mg | ORAL_TABLET | Freq: Every day | ORAL | Status: DC
  Administered 2021-09-01 – 2021-09-02 (×2): 100 mg via ORAL
  Filled 2021-08-31 (×2): qty 2

## 2021-08-31 MED ORDER — LOSARTAN POTASSIUM 50 MG PO TABS
75.0000 mg | ORAL_TABLET | Freq: Once | ORAL | Status: AC
  Administered 2021-08-31: 75 mg via ORAL
  Filled 2021-08-31: qty 1

## 2021-08-31 NOTE — Plan of Care (Signed)

## 2021-08-31 NOTE — Plan of Care (Signed)

## 2021-08-31 NOTE — Consult Note (Signed)
PULMONOLOGY         Date: 08/31/2021,   MRN# [id number] Joseph [name] [birth year]     AdmissionWeight: 89.5 kg                 CurrentWeight: 88.6 kg  Referring provider: Dr [name]   CHIEF COMPLAINT:   Right upper lobe lung mass    HISTORY OF PRESENT ILLNESS   This is a 41 year old male with a history of atopic allergic moderate persistent asthma with atopic dermatitis and eczema, essential hypertension, COPD who came in with worsening respiratory status and including coughing and recent recurrent hospitalization in June and July associated with flushing, episodes of hypertension tachycardia had CT chest done with findings of bronchitic changes bilaterally as well as a infiltrate of the right upper lobe with areas of paraseptal emphysema worse in the upper lobes bilaterally there are also multiple old rib fractures seen bilaterally.  He had leukocytosis on arrival and is being treated for community-acquired pneumonia.  PCCM consultation for abnormal CT chest imaging concerning for possible bronchogenic carcinoma.   PAST MEDICAL HISTORY   Past Medical History:  Diagnosis Date   Asthma    COPD (chronic obstructive pulmonary disease) ([place])    Eczema    Hypertension      SURGICAL HISTORY   History reviewed. No pertinent surgical history.   FAMILY HISTORY   Family History  Problem Relation Age of Onset   Hypertension Mother      SOCIAL HISTORY   Social History   Tobacco Use   Smoking status: Every Day   Smokeless tobacco: Never  Substance Use Topics   Alcohol use: No   Drug use: Yes    Types: Marijuana     MEDICATIONS    Home Medication:    Current Medication:  Current Facility-Administered Medications:    albuterol (PROVENTIL) (2.5 MG/3ML) 0.083% nebulizer solution 3 mL, 3 mL, Nebulization, Q6H PRN, [name], Aseem, MD, 3 mL at 08/30/21 1919   enoxaparin (LOVENOX) injection 45 mg, 0.5 mg/kg, Subcutaneous, Q24H, Pokhrel, Laxman, MD, 45 mg at  08/30/21 2108   fluticasone furoate-vilanterol (BREO ELLIPTA) 200-25 MCG/ACT 1 puff, 1 puff, Inhalation, Daily, [name], Aseem, MD, 1 puff at 08/30/21 0850   hydrALAZINE (APRESOLINE) injection 10 mg, 10 mg, Intravenous, Q6H PRN, Pokhrel, Laxman, MD, 10 mg at 08/30/21 1403   hydrochlorothiazide (HYDRODIURIL) tablet 25 mg, 25 mg, Oral, Daily, [name], Aseem, MD, 25 mg at 08/30/21 0851   labetalol (NORMODYNE) injection 20 mg, 20 mg, Intravenous, Q3H PRN, Mansy, Jan A, MD, 20 mg at 08/30/21 2109   losartan (COZAAR) tablet 25 mg, 25 mg, Oral, Daily, Pokhrel, Laxman, MD, 25 mg at 08/30/21 1332   metoprolol tartrate (LOPRESSOR) injection 5 mg, 5 mg, Intravenous, Q6H PRN, Pokhrel, Laxman, MD, 5 mg at 08/30/21 1443   montelukast (SINGULAIR) tablet 10 mg, 10 mg, Oral, QHS, Kaul, Aseem, MD, 10 mg at 08/30/21 2108   nitroGLYCERIN (NITROSTAT) SL tablet 0.4 mg, 0.4 mg, Sublingual, Q5 min PRN, Pokhrel, Laxman, MD, 0.4 mg at 08/30/21 1432   pantoprazole (PROTONIX) EC tablet 40 mg, 40 mg, Oral, Daily, Kaul, Aseem, MD, 40 mg at 08/30/21 0851   [COMPLETED] methylPREDNISolone sodium succinate (SOLU-MEDROL) 125 mg/2 mL injection 125 mg, 125 mg, Intravenous, Q12H, 125 mg at 08/30/21 0456 **FOLLOWED BY** predniSONE (DELTASONE) tablet 40 mg, 40 mg, Oral, Q breakfast, [name], Aseem, MD, 40 mg at 08/30/21 1918   tobramycin (TOBREX) 0.3 % ophthalmic solution 1-2 drop, 1-2 drop, Right Eye, QID,  Pokhrel, Corrie Mckusick, MD, 1 drop at 08/30/21 2109    ALLERGIES   Penicillins, Pork-derived products, and Banana     REVIEW OF SYSTEMS    Review of Systems:  Gen:  Denies  fever, sweats, chills weigh loss  HEENT: Denies blurred vision, double vision, ear pain, eye pain, hearing loss, nose bleeds, sore throat Cardiac:  No dizziness, chest pain or heaviness, chest tightness,edema Resp:   reports dyspnea chronically  Gi: Denies swallowing difficulty, stomach pain, nausea or vomiting, diarrhea, constipation, bowel incontinence Gu:  Denies  bladder incontinence, burning urine Ext:   Denies Joint pain, stiffness or swelling Skin: Denies  skin rash, easy bruising or bleeding or hives Endoc:  Denies polyuria, polydipsia , polyphagia or weight change Psych:   Denies depression, insomnia or hallucinations   Other:  All other systems negative   VS: BP (!) 161/104 (BP Location: Right Arm)   Pulse (!) 101   Temp 97.8 F (36.6 C)   Resp 20   Wt 88.6 kg   SpO2 94%   BMI 32.50 kg/m      PHYSICAL EXAM    GENERAL:NAD, no fevers, chills, no weakness no fatigue HEAD: Normocephalic, atraumatic.  EYES: Pupils equal, round, reactive to light. Extraocular muscles intact. No scleral icterus.  MOUTH: Moist mucosal membrane. Dentition intact. No abscess noted.  EAR, NOSE, THROAT: Clear without exudates. No external lesions.  NECK: Supple. No thyromegaly. No nodules. No JVD.  PULMONARY: decreased breath sounds with mild rhonchi worse at bases bilaterally.  CARDIOVASCULAR: S1 and S2. Regular rate and rhythm. No murmurs, rubs, or gallops. No edema. Pedal pulses 2+ bilaterally.  GASTROINTESTINAL: Soft, nontender, nondistended. No masses. Positive bowel sounds. No hepatosplenomegaly.  MUSCULOSKELETAL: No swelling, clubbing, or edema. Range of motion full in all extremities.  NEUROLOGIC: Cranial nerves II through XII are intact. No gross focal neurological deficits. Sensation intact. Reflexes intact.  SKIN: No ulceration, lesions, rashes, or cyanosis. Skin warm and dry. Turgor intact.  PSYCHIATRIC: Mood, affect within normal limits. The patient is awake, alert and oriented x 3. Insight, judgment intact.       IMAGING     ASSESSMENT/PLAN   Acute hypoxemic respiratory failure - present on admission  - COVID19 negative 08/21/21  - supplemental O2 during my evaluation 3L/min - will perform infectious workup for pneumonia -Respiratory viral panel-negative -serum fungitell -legionella ab -strep pneumoniae ur AG -Histoplasma Ur  Ag -sputum resp cultures -AFB sputum expectorated specimen -sputum cytology  -reviewed pertinent imaging with patient today - ESR -PT/OT for d/c planning  -please encourage patient to use incentive spirometer few times each hour while hospitalized.       Right lung masslike infiltrate   Continue pneumonia therapy , will re-image post full scope of treatment         Thank you for allowing me to participate in the care of this patient.   Patient/Family are satisfied with care plan and all questions have been answered.    Provider disclosure: Patient with at least one acute or chronic illness or injury that poses a threat to life or bodily function and is being managed actively during this encounter.  All of the below services have been performed independently by signing provider:  review of prior documentation from internal and or external health records.  Review of previous and current lab results.  Interview and comprehensive assessment during patient visit today. Review of current and previous chest radiographs/CT scans. Discussion of management and test interpretation with health care team  and patient/family.   This document was prepared using Dragon voice recognition software and may include unintentional dictation errors.     Ottie Glazier, M.D.  Division of Pulmonary & Critical Care Medicine

## 2021-08-31 NOTE — Plan of Care (Signed)

## 2021-08-31 NOTE — Progress Notes (Addendum)
PROGRESS NOTE    Joseph [name]  WNI:[id number] DOB: 1980/05/08 DOA: 08/29/2021 PCP: Lydia Guiles, MD    Brief Narrative:   Joseph [name] is a 41 y.o. male with medical history significant of COPD with chronic respiratory failure on 2 L of oxygen, marijuana use, hypertension presented to hospital with cough dyspnea and no improvement in his symptoms despite course of steroids.  Patient had been given course of steroids from the ED and has had recurrent hospitalization at least 3 times over the last month.  Patient also complained of weakness fatigue.  In the ED blood pressure was slightly elevated.  CBC showed mildly elevated leukocytosis.  BMP within normal limits including BNP.  CT scan of the chest showed circumferential wall thickening and short segment occlusion predominantly affecting the bilateral lower lobe segmental and subsegmental bronchi suggestive airway disease with potential for infection.  Emphysema was noted with multiple old rib fractures.  Patient was then admitted hospital for further evaluation and treatment.  Assessment and plan. Principal Problem:   Cough Active Problems:   COPD with acute bronchitis (HCC)   Chronic respiratory failure (HCC)   Essential hypertension   OSA (obstructive sleep apnea)   Leukocytosis   Lymphedema of both lower extremities     COPD with acute bronchitis (HCC) CT scan with subsegmental occlusion of segmental and subsegmental bronchi.  On 2 L of nasal cannula as outpatient.  Pulmonary has been consulted due to recurrent admission to the hospital.  Continue steroids, bronchodilators.  Still has expiratory wheezing.  Respiratory viral panel negative.  Procalcitonin less than 0.1x2. will check covid test. CRP and sed rate within normal range.  Legionella pneumo antigen pending.  Troponin negative.  BNP at 26. Continuing home breo, singulair. Is also on dupilumab as outpt.    Lymphedema of both lower extremities Lower extremity duplex  ultrasound without any evidence of DVT.  Continue supportive care.  Review of 2D echocardiogram from 08/07/2021 showing LV ejection fraction of 55 to 60% with no regional wall motion abnormality.  We will give 1 dose of IV 40mg  of Lasix today.  BNP only at 26.  Discussed about lymphedema care with the patient's wife.  We will add a stockings. Today no edema   Leukocytosis Likely secondary to steroids.  Continue to monitor.   Essential hypertension, hypertensive urgency Continue HCTZ.  Blood pressure is elevated.  Add as needed hydralazine.  Add losartan 100. Will initiate.  Continue to monitor blood pressure.   Chronic respiratory failure secondary to underlying COPD  (HCC) 2 L of oxygen at baseline.  Patient is currently 2 L.  Debility, weakness.  PT advises outpt PT  OSA: supposed to be on bipap as outpt. Has had problems obtaining. Will order for use here       DVT prophylaxis:   Lovenox subcu   Code Status:     Code Status: Full code  Disposition: Home  Status is: Inpatient  Remains inpatient appropriate because: Pending clinical improvement, pulmonary evaluation,   Family Communication:  Spouse updated @ bedside 7/19  Consultants:  Pulmonary  Procedures:  None  Antimicrobials:  None  Anti-infectives (From admission, onward)    None      Subjective: Today, patient was seen and examined at bedside.  Breathing somewhat improved. Chest still feels tight, with wheeze  Objective: Vitals:   08/30/21 2159 08/31/21 0306 08/31/21 0410 08/31/21 0738  BP: (!) 149/111 (!) 142/106  (!) 161/104  Pulse: (!) 101 96  (!) 101  Resp: 20 20  20   Temp: 98.5 F (36.9 C) 97.8 F (36.6 C)  97.8 F (36.6 C)  TempSrc:      SpO2: 93% 93%  94%  Weight:   88.6 kg     Intake/Output Summary (Last 24 hours) at 08/31/2021 1031 Last data filed at 08/31/2021 0323 Gross per 24 hour  Intake --  Output 1300 ml  Net -1300 ml   Filed Weights   08/30/21 0500 08/31/21 0410  Weight:  89.5 kg 88.6 kg    Physical Examination: Body mass index is 32.5 kg/m.   General: Obese , not in obvious distress, on nasal cannula oxygen at 2 L/min. HENT:   No scleral pallor or icterus noted. Oral mucosa is moist.  Chest:  Bilateral expiratory wheezing noted CVS: S1 &S2 heard. No murmur.  Regular rate and rhythm. Abdomen: Soft, nontender, nondistended.  Bowel sounds are heard.   Extremities: No cyanosis, clubbing, no edema Psych: Alert, awake and oriented, normal mood CNS:  No cranial nerve deficits.  Power equal in all extremities.   Skin: Warm and dry.  No rashes noted.  Data Reviewed:   CBC: Recent Labs  Lab 08/29/21 0820 08/31/21 0514  WBC 28.1* 30.7*  HGB 14.6 14.4  HCT 46.3 45.3  MCV 82.1 79.6*  PLT 446* 429*    Basic Metabolic Panel: Recent Labs  Lab 08/29/21 0820 08/31/21 0514  NA 140 138  K 4.1 4.0  CL 107 99  CO2 28 32  GLUCOSE 99 121*  BUN 26* 39*  CREATININE 1.05 1.08  CALCIUM 8.0* 8.0*  MG  --  2.4    Liver Function Tests: Recent Labs  Lab 08/29/21 0853  AST 27  ALT 16  ALKPHOS 57  BILITOT 0.9  PROT 5.3*  ALBUMIN 2.5*     Radiology Studies: 08/31/21 Venous Img Lower Bilateral (DVT)  Result Date: 08/29/2021 CLINICAL DATA:  A 42 year old male presents with edema and shortness of breath. EXAM: Bilateral LOWER EXTREMITY VENOUS DOPPLER ULTRASOUND TECHNIQUE: Gray-scale sonography with compression, as well as color and duplex ultrasound, were performed to evaluate the deep venous system(s) from the level of the common femoral vein through the popliteal and proximal calf veins. COMPARISON:  None Available. FINDINGS: VENOUS Normal compressibility of the common femoral, superficial femoral, and popliteal veins, as well as the visualized calf veins. Visualized portions of profunda femoral vein and great saphenous vein unremarkable. No filling defects to suggest DVT on grayscale or color Doppler imaging. Doppler waveforms show normal direction of venous  flow, normal respiratory plasticity and response to augmentation. OTHER None. Limitations: none IMPRESSION: Negative for sonographic evidence of DVT in the bilateral lower extremities. Electronically Signed   By: 45 M.D.   On: 08/29/2021 14:15   CT CHEST W CONTRAST  Result Date: 08/29/2021 CLINICAL DATA:  Chronic cough for the past 8 weeks. Failed empiric treatment. EXAM: CT CHEST WITH CONTRAST TECHNIQUE: Multidetector CT imaging of the chest was performed during intravenous contrast administration. RADIATION DOSE REDUCTION: This exam was performed according to the departmental dose-optimization program which includes automated exposure control, adjustment of the mA and/or kV according to patient size and/or use of iterative reconstruction technique. CONTRAST:  77mL OMNIPAQUE IOHEXOL 300 MG/ML  SOLN COMPARISON:  Chest radiograph-08/29/2021; 08/21/2021; 08/07/2021 FINDINGS: Cardiovascular: Normal heart size. Trace amount of pericardial fluid, presumably physiologic. No evidence of thoracic aortic aneurysm or dissection on this nongated examination. Although this examination was not tailored for the evaluation the pulmonary arteries, there are  no discrete filling defects within the central pulmonary arterial tree to suggest central pulmonary embolism. Normal caliber of the main pulmonary artery. Mediastinum/Nodes: No bulky mediastinal, hilar or axillary lymph adenopathy. Lungs/Pleura: Moderate apical predominant centrilobular and paraseptal emphysematous change. Nonocclusive debris is seen within the main tracheal air column. There is circumferential wall thickening and short-segment occlusion predominantly involving the bilateral lower lobe segmental and subsegmental bronchi. Scattered ill-defined opacities are seen within the right upper lobe with dominant ill-defined consolidative opacity seen within the right upper lobe measuring approximally 1.8 x 1.7 x 1.6 cm (axial image 36, series 3; coronal  image 86, series 5) with smaller consolidative opacities seen on images 46 and 72, series 3). Minimal right lower lobe subsegmental atelectasis, potentially the sequela of previous multiple right-sided rib fractures. No air bronchograms. No pleural effusion or pneumothorax. Punctate granuloma within the right upper lobe (image 40, series 3). No discrete worrisome pulmonary nodules given background parenchymal abnormalities. Upper Abdomen: Limited early arterial phase evaluation of the upper abdomen is unremarkable. Musculoskeletal: Old rib fractures are seen bilaterally including the right sixth through eleventh ribs as well as the left ninth rib, several of which contain pseudoarthroses. Increased sclerosis are noted about several thoracic vertebral body endplates. DDD of C6-C7 with disc space height loss endplate irregularity and sclerosis. Regional soft tissues are normal. Normal appearance of the thyroid gland. IMPRESSION: 1. Circumferential wall thickening and short-segment occlusion predominantly affecting the bilateral lower lobe segmental and subsegmental bronchi most suggestive of airways disease with suspected developing areas of potential airways disseminated infection within the right upper lobe. Further evaluation with bronchoscopy could be performed as indicated. Follow-up chest CT after resolution of acute symptoms could be performed to ensure complete resolution and/or to establish a new baseline. 2.  Emphysema (ICD10-J43.9). 3. Multiple old rib fractures seen bilaterally, right greater than left, several of which demonstrates pseudoarthroses. Electronically Signed   By: Simonne Come M.D.   On: 08/29/2021 12:19      LOS: 2 days    Silvano Bilis, MD [facility] Available via Epic secure chat 7am-7pm After these hours, please refer to coverage provider listed on [email or web address] 08/31/2021, 10:31 AM

## 2021-08-31 NOTE — Progress Notes (Signed)
PT Cancellation Note  Patient Details Name: Joseph [name] MRN: [id number] DOB: 23-May-1980   Cancelled Treatment:    Reason Eval/Treat Not Completed: Other (comment).  Chart reviewed and attempted to see pt.  Pt currently sitting EOB and requested to hold off on therapy until tomorrow.  Pt notes that he is about to take a bird bath and would like to rest after being up for a while.  Pt to be seen at later date and time as medically appropriate.  Pt's HR 112 sitting EOB.   Nolon Bussing, PT, DPT 08/31/21, 3:59 PM

## 2021-09-01 DIAGNOSIS — J209 Acute bronchitis, unspecified: Secondary | ICD-10-CM | POA: Diagnosis not present

## 2021-09-01 DIAGNOSIS — J440 Chronic obstructive pulmonary disease with acute lower respiratory infection: Secondary | ICD-10-CM | POA: Diagnosis not present

## 2021-09-01 LAB — ANA COMPREHENSIVE PANEL

## 2021-09-01 LAB — CBC
HCT: 46.6 % (ref 39.0–52.0)
Hemoglobin: 14.6 g/dL (ref 13.0–17.0)
MCH: 25.3 pg — ABNORMAL LOW (ref 26.0–34.0)
MCHC: 31.3 g/dL (ref 30.0–36.0)
MCV: 80.6 fL (ref 80.0–100.0)
Platelets: 405 10*3/uL — ABNORMAL HIGH (ref 150–400)
RBC: 5.78 MIL/uL (ref 4.22–5.81)
RDW: 17.7 % — ABNORMAL HIGH (ref 11.5–15.5)
WBC: 25.7 10*3/uL — ABNORMAL HIGH (ref 4.0–10.5)
nRBC: 0 % (ref 0.0–0.2)

## 2021-09-01 LAB — BASIC METABOLIC PANEL
Anion gap: 3 — ABNORMAL LOW (ref 5–15)
BUN: 36 mg/dL — ABNORMAL HIGH (ref 6–20)
CO2: 33 mmol/L — ABNORMAL HIGH (ref 22–32)
Calcium: 7.7 mg/dL — ABNORMAL LOW (ref 8.9–10.3)
Chloride: 100 mmol/L (ref 98–111)
Creatinine, Ser: 1.03 mg/dL (ref 0.61–1.24)
GFR, Estimated: >60 mL/min (ref >60)
Glucose, Bld: 89 mg/dL (ref 70–99)
Potassium: 3.9 mmol/L (ref 3.5–5.1)
Sodium: 136 mmol/L (ref 135–145)

## 2021-09-01 LAB — CRYPTOCOCCUS ANTIGEN, SERUM: Cryptococcus Antigen, Serum: NEGATIVE

## 2021-09-01 LAB — SARS CORONAVIRUS 2 BY RT PCR: SARS Coronavirus 2 by RT PCR: NEGATIVE

## 2021-09-01 LAB — ANCA TITERS
Atypical P-ANCA titer: <1:20 {titer}
C-ANCA: <1:20 {titer}
P-ANCA: <1:20 {titer}

## 2021-09-01 MED ORDER — ACETAMINOPHEN 325 MG PO TABS
650.0000 mg | ORAL_TABLET | Freq: Once | ORAL | Status: AC
Start: 2021-09-01 — End: 2021-09-01
  Administered 2021-09-01: 650 mg via ORAL
  Filled 2021-09-01: qty 2

## 2021-09-01 NOTE — Plan of Care (Signed)
  Problem: Education: Goal: Knowledge of General Education information will improve Description: Including pain rating scale, medication(s)/side effects and non-pharmacologic comfort measures Outcome: Progressing   Problem: Health Behavior/Discharge Planning: Goal: Ability to manage health-related needs will improve Outcome: Progressing   Problem: Clinical Measurements: Goal: Ability to maintain clinical measurements within normal limits will improve Outcome: Progressing Goal: Will remain free from infection Outcome: Progressing Goal: Respiratory complications will improve Outcome: Progressing   Problem: Activity: Goal: Risk for activity intolerance will decrease Outcome: Progressing   Problem: Safety: Goal: Ability to remain free from injury will improve Outcome: Progressing   Problem: Skin Integrity: Goal: Risk for impaired skin integrity will decrease Outcome: Progressing

## 2021-09-01 NOTE — Care Management Important Message (Signed)
Important Message  Patient Details  Name: Joseph [name] MRN: [id number] Date of Birth: 11/18/1980   Medicare Important Message Given:  N/A - LOS <3 / Initial given by admissions     Olegario Messier A Ayslin Kundert 09/01/2021, 9:57 AM

## 2021-09-01 NOTE — Progress Notes (Signed)
Occupational Therapy Treatment Patient Details Name: Joseph [name] MRN: [id number] DOB: January 16, 1981 Today's Date: 09/01/2021   History of present illness Patient is a 41 year old with  41 y.o. male with medical history significant of COPD with chronic respiratory failure on 2 L of oxygen, marijuana use, hypertension who presented today for worsening cough, dyspnea. Recurrent hospitilizations for similar. He complains of weakness and fatigue with movement. Negative for sonographic evidence of DVT in the bilateral lower extremities   OT comments  Upon entering the room, pt seated on EOB finishing lunch. Pt is pleasant and cooperative and agreeable to OT intervention. OT providing pt with energy conservation education handout in regards to general techniques, self care, and IADLs. OT discussed and educated pt on various ways to complete occupations throughout day to conserve energy as needed along with O2 management. PT was active participate in discussion and has began some of these strategies prior to hospital admission. Pt was able to demonstrate several reps of incentive spirometer with encouragement with proper technique. Pt remains seated on EOB at end of session with call bell and all needed items within reach.    Recommendations for follow up therapy are one component of a multi-disciplinary discharge planning process, led by the attending physician.  Recommendations may be updated based on patient status, additional functional criteria and insurance authorization.    Follow Up Recommendations  No OT follow up    Assistance Recommended at Discharge PRN  Patient can return home with the following  A lot of help with bathing/dressing/bathroom;Assistance with cooking/housework;Assist for transportation;Help with stairs or ramp for entrance   Equipment Recommendations  None recommended by OT       Precautions / Restrictions Precautions Precautions: Fall Restrictions Weight Bearing  Restrictions: No       Mobility Bed Mobility Overal bed mobility: Modified Independent                  Transfers Overall transfer level: Modified independent                       Balance Overall balance assessment: Needs assistance Sitting-balance support: Feet supported Sitting balance-Leahy Scale: Good     Standing balance support: No upper extremity supported Standing balance-Leahy Scale: Fair Standing balance comment: supervision for safety                           ADL either performed or assessed with clinical judgement   ADL Overall ADL's : Needs assistance/impaired                                       General ADL Comments: EC education this session    Extremity/Trunk Assessment Upper Extremity Assessment Upper Extremity Assessment: Overall WFL for tasks assessed            Vision Patient Visual Report: No change from baseline            Cognition Arousal/Alertness: Awake/alert Behavior During Therapy: WFL for tasks assessed/performed Overall Cognitive Status: Within Functional Limits for tasks assessed                                 General Comments: Pt is pleasant and cooperative overall  Pertinent Vitals/ Pain       Pain Assessment Pain Assessment: No/denies pain         Frequency  Min 2X/week        Progress Toward Goals  OT Goals(current goals can now be found in the care plan section)  Progress towards OT goals: Progressing toward goals  Acute Rehab OT Goals Patient Stated Goal: to be strong enough to walk outside again OT Goal Formulation: With patient Time For Goal Achievement: 09/13/21 Potential to Achieve Goals: Fair  Plan Discharge plan remains appropriate;Frequency remains appropriate       AM-PAC OT "6 Clicks" Daily Activity     Outcome Measure   Help from another person eating meals?: None Help from another person taking care of  personal grooming?: None Help from another person toileting, which includes using toliet, bedpan, or urinal?: None Help from another person bathing (including washing, rinsing, drying)?: None Help from another person to put on and taking off regular upper body clothing?: None Help from another person to put on and taking off regular lower body clothing?: None 6 Click Score: 24    End of Session Equipment Utilized During Treatment: Oxygen  OT Visit Diagnosis: Muscle weakness (generalized) (M62.81)   Activity Tolerance Patient tolerated treatment well   Patient Left in bed;with call bell/phone within reach   Nurse Communication Mobility status        Time: 0017-4944 OT Time Calculation (min): 26 min  Charges: OT General Charges $OT Visit: 1 Visit OT Treatments $Self Care/Home Management : 23-37 mins  Jackquline Denmark, MS, OTR/L , CBIS ascom [phone]  09/01/21, 2:57 PM

## 2021-09-01 NOTE — Progress Notes (Signed)
PULMONOLOGY         Date: 09/01/2021,   MRN# [id number] Joseph [name] May 22, 1980     AdmissionWeight: 89.5 kg                 CurrentWeight: 88.6 kg  Referring provider: Dr [name]   CHIEF COMPLAINT:   Right upper lobe lung mass    HISTORY OF PRESENT ILLNESS   This is a 41 year old male with a history of atopic allergic moderate persistent asthma with atopic dermatitis and eczema, essential hypertension, COPD who came in with worsening respiratory status and including coughing and recent recurrent hospitalization in June and July associated with flushing, episodes of hypertension tachycardia had CT chest done with findings of bronchitic changes bilaterally as well as a infiltrate of the right upper lobe with areas of paraseptal emphysema worse in the upper lobes bilaterally there are also multiple old rib fractures seen bilaterally.  He had leukocytosis on arrival and is being treated for community-acquired pneumonia.  PCCM consultation for abnormal CT chest imaging concerning for possible bronchogenic carcinoma.  09/01/21- patient is cleared for dc home from pulmonary perspective and is able to be seen on outpatient basis in pulmonary clinic for additional evaluation and management.   PAST MEDICAL HISTORY   Past Medical History:  Diagnosis Date   Asthma    COPD (chronic obstructive pulmonary disease) ([place])    Eczema    Hypertension      SURGICAL HISTORY   History reviewed. No pertinent surgical history.   FAMILY HISTORY   Family History  Problem Relation Age of Onset   Hypertension Mother      SOCIAL HISTORY   Social History   Tobacco Use   Smoking status: Every Day   Smokeless tobacco: Never  Substance Use Topics   Alcohol use: No   Drug use: Yes    Types: Marijuana     MEDICATIONS    Home Medication:    Current Medication:  Current Facility-Administered Medications:    albuterol (PROVENTIL) (2.5 MG/3ML) 0.083% nebulizer solution 3 mL,  3 mL, Nebulization, Q6H PRN, [name], Aseem, MD, 3 mL at 09/01/21 1509   enoxaparin (LOVENOX) injection 45 mg, 0.5 mg/kg, Subcutaneous, Q24H, Pokhrel, Laxman, MD, 45 mg at 08/31/21 2035   fluticasone furoate-vilanterol (BREO ELLIPTA) 200-25 MCG/ACT 1 puff, 1 puff, Inhalation, Daily, [name], Aseem, MD, 1 puff at 09/01/21 0930   hydrALAZINE (APRESOLINE) injection 10 mg, 10 mg, Intravenous, Q6H PRN, Pokhrel, Laxman, MD, 10 mg at 08/30/21 1403   hydrochlorothiazide (HYDRODIURIL) tablet 25 mg, 25 mg, Oral, Daily, [name], Aseem, MD, 25 mg at 09/01/21 7510   labetalol (NORMODYNE) injection 20 mg, 20 mg, Intravenous, Q3H PRN, Mansy, Jan A, MD, 20 mg at 08/31/21 1849   losartan (COZAAR) tablet 100 mg, 100 mg, Oral, Daily, Wouk, Ailene Rud, MD, 100 mg at 09/01/21 0927   metoprolol tartrate (LOPRESSOR) injection 5 mg, 5 mg, Intravenous, Q6H PRN, Pokhrel, Laxman, MD, 5 mg at 08/30/21 1443   montelukast (SINGULAIR) tablet 10 mg, 10 mg, Oral, QHS, Kaul, Aseem, MD, 10 mg at 08/31/21 2035   nitroGLYCERIN (NITROSTAT) SL tablet 0.4 mg, 0.4 mg, Sublingual, Q5 min PRN, Pokhrel, Laxman, MD, 0.4 mg at 08/30/21 1432   pantoprazole (PROTONIX) EC tablet 40 mg, 40 mg, Oral, Daily, [name], Aseem, MD, 40 mg at 09/01/21 0926   [COMPLETED] methylPREDNISolone sodium succinate (SOLU-MEDROL) 125 mg/2 mL injection 125 mg, 125 mg, Intravenous, Q12H, 125 mg at 08/30/21 0456 **FOLLOWED BY** predniSONE (DELTASONE) tablet 40  mg, 40 mg, Oral, Q breakfast, [name], Aseem, MD, 40 mg at 09/01/21 0254   tobramycin (TOBREX) 0.3 % ophthalmic solution 1-2 drop, 1-2 drop, Right Eye, QID, Pokhrel, Laxman, MD, 2 drop at 09/01/21 1505    ALLERGIES   Penicillins, Pork-derived products, and Banana     REVIEW OF SYSTEMS    Review of Systems:  Gen:  Denies  fever, sweats, chills weigh loss  HEENT: Denies blurred vision, double vision, ear pain, eye pain, hearing loss, nose bleeds, sore throat Cardiac:  No dizziness, chest pain or heaviness, chest  tightness,edema Resp:   reports dyspnea chronically  Gi: Denies swallowing difficulty, stomach pain, nausea or vomiting, diarrhea, constipation, bowel incontinence Gu:  Denies bladder incontinence, burning urine Ext:   Denies Joint pain, stiffness or swelling Skin: Denies  skin rash, easy bruising or bleeding or hives Endoc:  Denies polyuria, polydipsia , polyphagia or weight change Psych:   Denies depression, insomnia or hallucinations   Other:  All other systems negative   VS: BP (!) 158/107 (BP Location: Left Arm)   Pulse (!) 105   Temp 98.5 F (36.9 C)   Resp 16   Ht 5' 5" (1.651 m)   Wt 88.6 kg   SpO2 96%   BMI 32.50 kg/m      PHYSICAL EXAM    GENERAL:NAD, no fevers, chills, no weakness no fatigue HEAD: Normocephalic, atraumatic.  EYES: Pupils equal, round, reactive to light. Extraocular muscles intact. No scleral icterus.  MOUTH: Moist mucosal membrane. Dentition intact. No abscess noted.  EAR, NOSE, THROAT: Clear without exudates. No external lesions.  NECK: Supple. No thyromegaly. No nodules. No JVD.  PULMONARY: decreased breath sounds with mild rhonchi worse at bases bilaterally.  CARDIOVASCULAR: S1 and S2. Regular rate and rhythm. No murmurs, rubs, or gallops. No edema. Pedal pulses 2+ bilaterally.  GASTROINTESTINAL: Soft, nontender, nondistended. No masses. Positive bowel sounds. No hepatosplenomegaly.  MUSCULOSKELETAL: No swelling, clubbing, or edema. Range of motion full in all extremities.  NEUROLOGIC: Cranial nerves II through XII are intact. No gross focal neurological deficits. Sensation intact. Reflexes intact.  SKIN: No ulceration, lesions, rashes, or cyanosis. Skin warm and dry. Turgor intact.  PSYCHIATRIC: Mood, affect within normal limits. The patient is awake, alert and oriented x 3. Insight, judgment intact.       IMAGING     ASSESSMENT/PLAN   Acute hypoxemic respiratory failure - present on admission  - COVID19 negative 08/21/21  -  supplemental O2 during my evaluation 3L/min - will perform infectious workup for pneumonia -Respiratory viral panel-negative -serum fungitell -legionella ab -strep pneumoniae ur AG -Histoplasma Ur Ag -sputum resp cultures -AFB sputum expectorated specimen -sputum cytology  -reviewed pertinent imaging with patient today - ESR -PT/OT for d/c planning  -please encourage patient to use incentive spirometer few times each hour while hospitalized.       Right lung masslike infiltrate   Continue pneumonia therapy , will re-image post full scope of treatment         Thank you for allowing me to participate in the care of this patient.   Patient/Family are satisfied with care plan and all questions have been answered.    Provider disclosure: Patient with at least one acute or chronic illness or injury that poses a threat to life or bodily function and is being managed actively during this encounter.  All of the below services have been performed independently by signing provider:  review of prior documentation from internal and or external health  records.  Review of previous and current lab results.  Interview and comprehensive assessment during patient visit today. Review of current and previous chest radiographs/CT scans. Discussion of management and test interpretation with health care team and patient/family.   This document was prepared using Dragon voice recognition software and may include unintentional dictation errors.     Ottie Glazier, M.D.  Division of Pulmonary & Critical Care Medicine

## 2021-09-01 NOTE — Progress Notes (Signed)
PROGRESS NOTE    Joseph [name]  EVO:[id number] DOB: 03-Sep-1980 DOA: 08/29/2021 PCP: Lydia Guiles, MD    Brief Narrative:   Joseph [name] is a 41 y.o. male with medical history significant of COPD with chronic respiratory failure on 2 L of oxygen, marijuana use, hypertension presented to hospital with cough dyspnea and no improvement in his symptoms despite course of steroids.  Patient had been given course of steroids from the ED and has had recurrent hospitalization at least 3 times over the last month.  Patient also complained of weakness fatigue.  In the ED blood pressure was slightly elevated.  CBC showed mildly elevated leukocytosis.  BMP within normal limits including BNP.  CT scan of the chest showed circumferential wall thickening and short segment occlusion predominantly affecting the bilateral lower lobe segmental and subsegmental bronchi suggestive airway disease with potential for infection.  Emphysema was noted with multiple old rib fractures.  Patient was then admitted hospital for further evaluation and treatment.  Assessment and plan. Active Problems:   COPD with acute bronchitis (HCC)   Chronic respiratory failure (HCC)   Essential hypertension   OSA (obstructive sleep apnea)   Leukocytosis   Lymphedema of both lower extremities     COPD with acute bronchitis (HCC) CT scan with subsegmental occlusion of segmental and subsegmental bronchi.  On 2 L of nasal cannula as outpatient.    Continue steroids, bronchodilators.  Still has expiratory wheezing.  Respiratory viral panel negative.  Procalcitonin less than 0.1x2. will check covid test. CRP and sed rate within normal range.  Legionella pneumo antigen pending.  Troponin negative.  BNP at 26. Continuing home breo, singulair. Is also on dupilumab as outpt. Pulmonary has been consulted due to recurrent admission to the hospital - atypical infectious w/u ordered. Will touch base w/ them about discharge readiness.   Lymphedema  of both lower extremities Lower extremity duplex ultrasound without any evidence of DVT.  Continue supportive care.  Review of 2D echocardiogram from 08/07/2021 showing LV ejection fraction of 55 to 60% with no regional wall motion abnormality.  BNP only at 26.  Discussed about lymphedema care with the patient's wife.  We will add a stockings. Today no edema   Leukocytosis Likely secondary to steroids.  Continue to monitor.   Essential hypertension, hypertensive urgency Bp remains elevated but improved from yesterday. Continue home hctz and losartan which has been added   Chronic respiratory failure secondary to underlying COPD  (HCC) 2 L of oxygen at baseline.  Patient is currently 2 L.  Debility, weakness.  PT advises outpt PT  OSA: supposed to be on bipap as outpt. Has had problems obtaining. ordered for use here       DVT prophylaxis:   Lovenox subcu   Code Status:     Code Status: Full code  Disposition: Home  Status is: Inpatient  Remains inpatient appropriate because: Pending clinical improvement, pulmonary evaluation,   Family Communication:  Spouse updated @ bedside 7/19  Consultants:  Pulmonary  Procedures:  None  Antimicrobials:  None  Anti-infectives (From admission, onward)    None      Subjective: Today, patient was seen and examined at bedside.  Breathing somewhat improved. Swelling resolved  Objective: Vitals:   08/31/21 1613 08/31/21 2010 09/01/21 0533 09/01/21 0910  BP: (!) 157/106 (!) 143/99 (!) 157/97 (!) 150/107  Pulse: (!) 112 94 88 (!) 104  Resp: 16 20 16 16   Temp: 98 F (36.7 C) 97.9 F (36.6 C) (!)  97.5 F (36.4 C) 98 F (36.7 C)  TempSrc:  Oral    SpO2: 95% 97% 98% 95%  Weight:   88.6 kg   Height:   5\' 5"  (1.651 m)     Intake/Output Summary (Last 24 hours) at 09/01/2021 1525 Last data filed at 09/01/2021 1417 Gross per 24 hour  Intake 960 ml  Output 600 ml  Net 360 ml   Filed Weights   08/30/21 0500 08/31/21 0410  09/01/21 0533  Weight: 89.5 kg 88.6 kg 88.6 kg    Physical Examination: Body mass index is 32.5 kg/m.   General: Obese , not in obvious distress, on nasal cannula oxygen at 2 L/min. HENT:   No scleral pallor or icterus noted. Oral mucosa is moist.  Chest:  Bilateral expiratory wheezing noted CVS: S1 &S2 heard. No murmur.  Regular rate and rhythm. Abdomen: Soft, nontender, nondistended.  Bowel sounds are heard.   Extremities: No cyanosis, clubbing, no edema Psych: Alert, awake and oriented, normal mood CNS:  No cranial nerve deficits.  Power equal in all extremities.   Skin: Warm and dry.  No rashes noted.  Data Reviewed:   CBC: Recent Labs  Lab 08/29/21 0820 08/31/21 0514 09/01/21 0404  WBC 28.1* 30.7* 25.7*  HGB 14.6 14.4 14.6  HCT 46.3 45.3 46.6  MCV 82.1 79.6* 80.6  PLT 446* 429* 405*    Basic Metabolic Panel: Recent Labs  Lab 08/29/21 0820 08/31/21 0514 09/01/21 0404  NA 140 138 136  K 4.1 4.0 3.9  CL 107 99 100  CO2 28 32 33*  GLUCOSE 99 121* 89  BUN 26* 39* 36*  CREATININE 1.05 1.08 1.03  CALCIUM 8.0* 8.0* 7.7*  MG  --  2.4  --     Liver Function Tests: Recent Labs  Lab 08/29/21 0853  AST 27  ALT 16  ALKPHOS 57  BILITOT 0.9  PROT 5.3*  ALBUMIN 2.5*     Radiology Studies: No results found.    LOS: 3 days    08/31/21, MD [facility] Available via Epic secure chat 7am-7pm After these hours, please refer to coverage provider listed on [email or web address] 09/01/2021, 3:25 PM

## 2021-09-01 NOTE — Progress Notes (Addendum)
Physical Therapy Treatment Patient Details Name: Joseph [name] MRN: [id number] DOB: 1980-07-02 Today's Date: 09/01/2021   History of Present Illness Patient is a 41 year old with  41 y.o. male with medical history significant of COPD with chronic respiratory failure on 2 L of oxygen, marijuana use, hypertension who presented today for worsening cough, dyspnea. Recurrent hospitilizations for similar. He complains of weakness and fatigue with movement. Negative for sonographic evidence of DVT in the bilateral lower extremities    PT Comments    Patient cooperative with PT treatment. He reports his LE edema has improved and noted he is wearing compression stockings. Increased activity tolerance this session. The patient ambulated in hallway, 333ft without assistive device, on 4 L02. Sp02 94% with walking with heart rate increasing to 132bpm. The patient was educated on energy conservation techniques and taking rest breaks with walking as needed. Recommend to continue PT to maximize independence and facilitate return to prior level of function.    Recommendations for follow up therapy are one component of a multi-disciplinary discharge planning process, led by the attending physician.  Recommendations may be updated based on patient status, additional functional criteria and insurance authorization.  Follow Up Recommendations  Outpatient PT     Assistance Recommended at Discharge PRN  Patient can return home with the following Assist for transportation;Help with stairs or ramp for entrance   Equipment Recommendations  None recommended by PT    Recommendations for Other Services       Precautions / Restrictions Precautions Precautions: Fall Restrictions Weight Bearing Restrictions: No     Mobility  Bed Mobility               General bed mobility comments: not assessed    Transfers Overall transfer level: Modified independent                       Ambulation/Gait Ambulation/Gait assistance: Supervision Gait Distance (Feet): 380 Feet Assistive device: None Gait Pattern/deviations: Decreased stance time - left Gait velocity: decreased     General Gait Details: no loss of balance with ambulation without assistive device. cues for taking rest breaks as needed and other energy conservation techniques. Sp02 94% after walking on 4 L02 with heart rate increasing up to 132bpm. increased work of breathing noted with ambulation, however patient reports this is normal for him. RPE was 5/10   Stairs             Wheelchair Mobility    Modified Rankin (Stroke Patients Only)       Balance Overall balance assessment: Needs assistance Sitting-balance support: Feet supported Sitting balance-Leahy Scale: Good     Standing balance support: No upper extremity supported Standing balance-Leahy Scale: Fair Standing balance comment: supervision for safety                            Cognition Arousal/Alertness: Awake/alert Behavior During Therapy: WFL for tasks assessed/performed Overall Cognitive Status: Within Functional Limits for tasks assessed                                          Exercises      General Comments General comments (skin integrity, edema, etc.): encouraged patient to use his incentive spirometer      Pertinent Vitals/Pain Pain Assessment Pain Assessment: Faces Faces Pain Scale: Hurts a  little bit Pain Location: low back pain Pain Descriptors / Indicators: Discomfort Pain Intervention(s): Limited activity within patient's tolerance    Home Living                          Prior Function            PT Goals (current goals can now be found in the care plan section) Acute Rehab PT Goals Patient Stated Goal: to figure out what is going on medically PT Goal Formulation: With patient Time For Goal Achievement: 09/13/21 Potential to Achieve Goals: Good Progress  towards PT goals: Progressing toward goals    Frequency    Min 2X/week      PT Plan Current plan remains appropriate    Co-evaluation              AM-PAC PT "6 Clicks" Mobility   Outcome Measure  Help needed turning from your back to your side while in a flat bed without using bedrails?: None Help needed moving from lying on your back to sitting on the side of a flat bed without using bedrails?: None Help needed moving to and from a bed to a chair (including a wheelchair)?: A Little Help needed standing up from a chair using your arms (e.g., wheelchair or bedside chair)?: A Little Help needed to walk in hospital room?: A Little Help needed climbing 3-5 steps with a railing? : A Little 6 Click Score: 20    End of Session Equipment Utilized During Treatment: Oxygen Activity Tolerance: Patient limited by fatigue;Patient tolerated treatment well Patient left: in bed;with nursing/sitter in room (seated on edge of bed) Nurse Communication: Mobility status (elevated HR with ambulation) PT Visit Diagnosis: Unsteadiness on feet (R26.81);Muscle weakness (generalized) (M62.81)     Time: 4268-3419 PT Time Calculation (min) (ACUTE ONLY): 19 min  Charges:  $Therapeutic Activity: 8-22 mins                     Donna Bernard, PT, MPT    Joseph [name] 09/01/2021, 3:34 PM

## 2021-09-02 DIAGNOSIS — J209 Acute bronchitis, unspecified: Secondary | ICD-10-CM | POA: Diagnosis not present

## 2021-09-02 DIAGNOSIS — J440 Chronic obstructive pulmonary disease with acute lower respiratory infection: Secondary | ICD-10-CM | POA: Diagnosis not present

## 2021-09-02 LAB — BASIC METABOLIC PANEL
Anion gap: 4 — ABNORMAL LOW (ref 5–15)
BUN: 30 mg/dL — ABNORMAL HIGH (ref 6–20)
CO2: 36 mmol/L — ABNORMAL HIGH (ref 22–32)
Calcium: 7.9 mg/dL — ABNORMAL LOW (ref 8.9–10.3)
Chloride: 101 mmol/L (ref 98–111)
Creatinine, Ser: 1.12 mg/dL (ref 0.61–1.24)
GFR, Estimated: >60 mL/min (ref >60)
Glucose, Bld: 109 mg/dL — ABNORMAL HIGH (ref 70–99)
Potassium: 3.7 mmol/L (ref 3.5–5.1)
Sodium: 141 mmol/L (ref 135–145)

## 2021-09-02 MED ORDER — PREDNISONE 20 MG PO TABS: ORAL_TABLET | ORAL | 0 refills | Status: AC

## 2021-09-02 MED ORDER — LOSARTAN POTASSIUM 100 MG PO TABS: 100.0000 mg | ORAL_TABLET | Freq: Every day | ORAL | 1 refills | Status: AC

## 2021-09-02 NOTE — Care Management Important Message (Signed)
Important Message  Patient Details  Name: Joseph [name] MRN: [id number] Date of Birth: 1981-01-26   Medicare Important Message Given:  Yes     Olegario Messier A Anquinette Pierro 09/02/2021, 10:44 AM

## 2021-09-02 NOTE — TOC Initial Note (Addendum)
Transition of Care [facility]) - Initial/Assessment Note    Patient Details  Name: Joseph [name] MRN: [id number] Date of Birth: 05-05-1980  Transition of Care [facility]) CM/SW Contact:    Liliana Cline, LCSW Phone Number: 09/02/2021, 12:32 PM  Clinical Narrative:  Patient to DC home today. Spoke with patient. Patient states he has home o2 through [employer] but does not have a portable tank for the ride home. Patient states his wife is going to pick him up on her lunch break and take him home, so does not have time to go get a tank from the house. CSW called [employer] and spoke to [place] who stated they could bring a tank to the bedside but it would take about 2 hours for delivery. CSW informed patient of this who elected to have his wife pick up a tank from the house.  Patient states he was not aware of OPPT referral and is not interested in OPPT.   12:45- Patient states his wife cannot pick up a tank from the house. Notified Jermaine with [employer] who stated they will have one brought to the room, it will be 2+ hours.   3:06- Jermaine with [employer] states oxygen will be delivered by 4pm today.   Expected Discharge Plan: Home/Self Care Barriers to Discharge: Barriers Resolved   Patient Goals and CMS Choice Patient states their goals for this hospitalization and ongoing recovery are:: home with spouse CMS Medicare.gov Compare Post Acute Care list provided to:: Patient Choice offered to / list presented to : Patient  Expected Discharge Plan and Services Expected Discharge Plan: Home/Self Care       Living arrangements for the past 2 months: Single Family Home Expected Discharge Date: 09/02/21                                    Prior Living Arrangements/Services Living arrangements for the past 2 months: Single Family Home Lives with:: Spouse Patient language and need for interpreter reviewed:: Yes Do you feel safe going back to the place where you live?: Yes      Need for Family  Participation in Patient Care: Yes (Comment) Care giver support system in place?: Yes (comment) Current home services: DME Criminal Activity/Legal Involvement Pertinent to Current Situation/Hospitalization: No - Comment as needed  Activities of Daily Living Home Assistive Devices/Equipment: Cane (specify quad or straight), Oxygen ADL Screening (condition at time of admission) Patient's cognitive ability adequate to safely complete daily activities?: Yes Is the patient deaf or have difficulty hearing?: No Does the patient have difficulty seeing, even when wearing glasses/contacts?: No Does the patient have difficulty concentrating, remembering, or making decisions?: No Patient able to express need for assistance with ADLs?: Yes Does the patient have difficulty dressing or bathing?: No Independently performs ADLs?: Yes (appropriate for developmental age) Does the patient have difficulty walking or climbing stairs?: Yes Weakness of Legs: None Weakness of Arms/Hands: None  Permission Sought/Granted Permission sought to share information with : Oceanographer granted to share information with : Yes, Verbal Permission Granted     Permission granted to share info w AGENCY: [employer]        Emotional Assessment       Orientation: : Oriented to Self, Oriented to Place, Oriented to  Time, Oriented to Situation Alcohol / Substance Use: Not Applicable Psych Involvement: No (comment)  Admission diagnosis:  Cough [R05.9] COPD exacerbation (HCC) [J44.1] Patient Active  Problem List   Diagnosis Date Noted   Leukocytosis 08/29/2021   Lymphedema of both lower extremities 08/29/2021   OSA (obstructive sleep apnea) 08/23/2021   COPD with acute exacerbation (HCC) 08/21/2021   COPD exacerbation (HCC) 08/07/2021   Acute asthma exacerbation 12/10/2020   COPD with acute bronchitis (HCC) 12/10/2020   Chronic respiratory failure (HCC) 12/10/2020   Essential hypertension  12/10/2020   GERD (gastroesophageal reflux disease) 12/10/2020   Sepsis (HCC) 12/05/2017   Tobacco use 08/20/2013   PCP:  Lydia Guiles, MD Pharmacy:   [facility] [phone] - [place], [place] - [address] AT [facility] OF [address] [place] [address] [place] [address] Phone: [phone] Fax: [phone]  [id number] - [place], [place] - [address] AT [facility] [address] [place] [address] Phone: [phone] Fax: [phone]     Social Determinants of Health (SDOH) Interventions    Readmission Risk Interventions    09/02/2021   12:30 PM 08/23/2021   11:05 AM  Readmission Risk Prevention Plan  Post Dischage Appt  Complete  Medication Screening  Complete  Transportation Screening Complete Complete  PCP or Specialist Appt within 5-7 Days Complete   Home Care Screening Complete   Medication Review (RN CM) Complete

## 2021-09-02 NOTE — Progress Notes (Signed)
Discharge paperwork explain and given to patient. Express no questions or concerns at this time.  MD notified about last v/s, MD ok to proceed with discharge.

## 2021-09-02 NOTE — Progress Notes (Signed)
Occupational Therapy Treatment Patient Details Name: Joseph [name] MRN: [id number] DOB: 1981-01-20 Today's Date: 09/02/2021   History of present illness Patient is a 41 year old with  41 y.o. male with medical history significant of COPD with chronic respiratory failure on 2 L of oxygen, marijuana use, hypertension who presented today for worsening cough, dyspnea. Recurrent hospitilizations for similar. He complains of weakness and fatigue with movement. Negative for sonographic evidence of DVT in the bilateral lower extremities   OT comments  Joseph [name] made good effort today and was able to complete ADL tasks and mobility INDly, with SUPV for safety. He continued to labor for breathe, however. On 4L O2 supine in bed, pt's O2 saturation is at 91%, decreases to 88% with OOB movement. Provided educ re energy conservation, breathing techniques, importance of OOB movement and exercises to address back pain w/o exacerbating breathing. Pt verbalizes understanding, is able to provide teach-back. No additional OT required at this time; pt in agreement.    Recommendations for follow up therapy are one component of a multi-disciplinary discharge planning process, led by the attending physician.  Recommendations may be updated based on patient status, additional functional criteria and insurance authorization.    Follow Up Recommendations  No OT follow up    Assistance Recommended at Discharge PRN  Patient can return home with the following  A lot of help with bathing/dressing/bathroom;Assistance with cooking/housework   Equipment Recommendations  None recommended by OT    Recommendations for Other Services      Precautions / Restrictions Precautions Precautions: Fall Restrictions Weight Bearing Restrictions: No       Mobility Bed Mobility Overal bed mobility: Modified Independent                  Transfers Overall transfer level: Modified independent                  General transfer comment: supervision for safety     Balance Overall balance assessment: Needs assistance Sitting-balance support: Feet supported Sitting balance-Leahy Scale: Normal     Standing balance support: No upper extremity supported Standing balance-Leahy Scale: Fair Standing balance comment: supervision for safety                           ADL either performed or assessed with clinical judgement   ADL Overall ADL's : Needs assistance/impaired                         Toilet Transfer: Modified Independent   Toileting- Clothing Manipulation and Hygiene: Modified independent              Extremity/Trunk Assessment Upper Extremity Assessment Upper Extremity Assessment: Overall WFL for tasks assessed   Lower Extremity Assessment Lower Extremity Assessment: Overall WFL for tasks assessed        Vision       Perception     Praxis      Cognition Arousal/Alertness: Awake/alert Behavior During Therapy: WFL for tasks assessed/performed Overall Cognitive Status: Within Functional Limits for tasks assessed                                 General Comments: Pt is pleasant and cooperative overall        Exercises Other Exercises Other Exercises: Educ re: ECS, breathing techniques, importance of movement    Shoulder Instructions  General Comments      Pertinent Vitals/ Pain       Pain Assessment Pain Assessment: 0-10 Faces Pain Scale: Hurts a little bit Pain Location: low back pain Pain Descriptors / Indicators: Discomfort Pain Intervention(s): Repositioned  Home Living                                          Prior Functioning/Environment              Frequency  Min 2X/week        Progress Toward Goals  OT Goals(current goals can now be found in the care plan section)  Progress towards OT goals: Progressing toward goals  Acute Rehab OT Goals OT Goal Formulation: With  patient Time For Goal Achievement: 09/13/21 Potential to Achieve Goals: Good  Plan Discharge plan remains appropriate;Frequency remains appropriate    Co-evaluation                 AM-PAC OT "6 Clicks" Daily Activity     Outcome Measure   Help from another person eating meals?: None Help from another person taking care of personal grooming?: None Help from another person toileting, which includes using toliet, bedpan, or urinal?: None Help from another person bathing (including washing, rinsing, drying)?: None Help from another person to put on and taking off regular upper body clothing?: None Help from another person to put on and taking off regular lower body clothing?: None 6 Click Score: 24    End of Session Equipment Utilized During Treatment: Oxygen  OT Visit Diagnosis: Muscle weakness (generalized) (M62.81)   Activity Tolerance Patient tolerated treatment well   Patient Left in chair;with call bell/phone within reach   Nurse Communication          Time: 6811-5726 OT Time Calculation (min): 24 min  Charges: OT General Charges $OT Visit: 1 Visit OT Treatments $Self Care/Home Management : 23-37 mins Latina Craver, PhD, MS, OTR/L 09/02/21, 11:28 AM

## 2021-09-02 NOTE — Progress Notes (Signed)
Patient has no portable oxygen to go home, MD and Case manager notified

## 2021-09-02 NOTE — Discharge Summary (Signed)
Joseph [name] EPP:[id number] DOB: 1980/07/03 DOA: 08/29/2021  PCP: Lydia Guiles, MD  Admit date: 08/29/2021 Discharge date: 09/02/2021  Time spent: 35 minutes  Recommendations for Outpatient Follow-up:  Outpatient pulmonology f/u  Pcp f/u, monitor bp    Discharge Diagnoses:  Active Problems:   COPD with acute bronchitis (HCC)   Chronic respiratory failure (HCC)   Essential hypertension   OSA (obstructive sleep apnea)   Leukocytosis   Lymphedema of both lower extremities   Discharge Condition: stable  Diet recommendation: hear thealthy  Filed Weights   08/31/21 0410 09/01/21 0533 09/02/21 0500  Weight: 88.6 kg 88.6 kg 89 kg    History of present illness:  From admission h and p Joseph [name] is a 41 y.o. male with medical history significant of COPD with chronic respiratory failure on 2 L of oxygen, marijuana use, hypertension presented to hospital with cough dyspnea and no improvement in his symptoms despite course of steroids.  Patient had been given course of steroids from the ED and has had recurrent hospitalization at least 3 times over the last month.  Patient also complained of weakness fatigue.  In the ED blood pressure was slightly elevated.  CBC showed mildly elevated leukocytosis.  BMP within normal limits including BNP.  CT scan of the chest showed circumferential wall thickening and short segment occlusion predominantly affecting the bilateral lower lobe segmental and subsegmental bronchi suggestive airway disease with potential for infection.  Emphysema was noted with multiple old rib fractures.  Patient was then admitted hospital for further evaluation and treatment.  Hospital Course:  COPD/asthma with acute bronchitis (HCC) CT scan with subsegmental occlusion of segmental and subsegmental bronchi.  On 2 L of nasal cannula as outpatient and stable on that here. Pulm consulted, labs for atypical infections obtained, several results pending at time of discharge but  what has resulted is negative. Treated with high dose oral steroids, will discharge on taper. Pulm did not advise any changes to home meds which we will continue. Has close f/u with his allergist and with pulmonology scheduled.       Lymphedema of both lower extremities Lower extremity duplex ultrasound without any evidence of DVT.   Review of 2D echocardiogram from 08/07/2021 showing LV ejection fraction of 55 to 60% with no regional wall motion abnormality.  BNP only at 26.  Discussed about lymphedema care with the patient's wife.  much improved with use of TED hose   Essential hypertension, hypertensive urgency Bp persistently elevated, losartan added, will need attention to this at pcp f/u   Chronic respiratory failure secondary to underlying COPD  (HCC) 2 L of oxygen at baseline.  Patient is currently 2 L.   OSA: supposed to be on bipap as outpt. Has had problems obtaining.   Procedures: none   Consultations: pulmonology  Discharge Exam: Vitals:   09/02/21 0327 09/02/21 0741  BP: (!) 152/95 (!) 158/94  Pulse: 99 (!) 108  Resp: 16   Temp: 98 F (36.7 C) 98.1 F (36.7 C)  SpO2: 96% 95%    General: Obese , not in obvious distress, on nasal cannula oxygen at 2 L/min. HENT:   No scleral pallor or icterus noted. Oral mucosa is moist.  Chest:  Bilateral expiratory wheezing noted CVS: S1 &S2 heard. No murmur.  Regular rate and rhythm. Abdomen: Soft, nontender, nondistended.  Bowel sounds are heard.   Extremities: No cyanosis, clubbing, no edema Psych: Alert, awake and oriented, normal mood CNS:  No cranial nerve deficits.  Power  equal in all extremities.   Skin: Warm and dry.  No rashes noted.  Discharge Instructions   Discharge Instructions     Diet - low sodium heart healthy   Complete by: As directed    Increase activity slowly   Complete by: As directed       Allergies as of 09/02/2021       Reactions   Penicillins Shortness Of Breath   Pork-derived Products  Nausea Only   Banana Hives        Medication List     TAKE these medications    albuterol 108 (90 Base) MCG/ACT inhaler Commonly known as: VENTOLIN HFA Inhale 2 puffs into the lungs every 6 (six) hours as needed for wheezing or shortness of breath.   albuterol (2.5 MG/3ML) 0.083% nebulizer solution Commonly known as: PROVENTIL Take 2.5 mg by nebulization every 6 (six) hours as needed for wheezing or shortness of breath.   Dupixent 300 MG/2ML prefilled syringe Generic drug: dupilumab Inject 300 mg into the skin every 14 (fourteen) days.   fluticasone-salmeterol 230-21 MCG/ACT inhaler Commonly known as: ADVAIR HFA Inhale 2 puffs into the lungs every 12 (twelve) hours.   hydrochlorothiazide 25 MG tablet Commonly known as: HYDRODIURIL Take 25 mg by mouth daily.   losartan 100 MG tablet Commonly known as: COZAAR Take 1 tablet (100 mg total) by mouth daily. Start taking on: September 03, 2021   montelukast 10 MG tablet Commonly known as: SINGULAIR Take 10 mg by mouth at bedtime.   omeprazole 20 MG capsule Commonly known as: PRILOSEC Take 20 mg by mouth daily as needed (GERD symptoms).   predniSONE 2.5 MG tablet Commonly known as: DELTASONE Take 7.5 mg by mouth daily. What changed: Another medication with the same name was changed. Make sure you understand how and when to take each.   predniSONE 20 MG tablet Commonly known as: DELTASONE Take 2 tablets (40 mg total) by mouth daily with breakfast for 3 days, THEN 1 tablet (20 mg total) daily with breakfast for 3 days, THEN 0.5 tablets (10 mg total) daily with breakfast for 3 days. Start taking on: September 02, 2021 What changed: See the new instructions.   tiZANidine 2 MG tablet Commonly known as: ZANAFLEX Take 2 mg by mouth 3 (three) times daily.   tobramycin 0.3 % ophthalmic ointment Commonly known as: TOBREX Place into the right eye 4 (four) times daily for 10 days. Place a 1/2 inch ribbon of ointment into the lower  eyelid   tobramycin 0.3 % ophthalmic solution Commonly known as: TOBREX Place 1-2 drops into the right eye in the morning, at noon, in the evening, and at bedtime.       Allergies  Allergen Reactions   Penicillins Shortness Of Breath   Pork-Derived Products Nausea Only   Banana Hives    Follow-up Information     Your asthma specialist and pulmonologist as scheduled Follow up.                   The results of significant diagnostics from this hospitalization (including imaging, microbiology, ancillary and laboratory) are listed below for reference.    Significant Diagnostic Studies: US Venous Img Lower Bilateral (DVT)  Result Date: 08/29/2021 CLINICAL DATA:  A 41 year old male presents with edema and shortness of breath. EXAM: Bilateral LOWER EXTREMITY VENOUS DOPPLER ULTRASOUND TECHNIQUE: Gray-scale sonography with compression, as well as color and duplex ultrasound, were performed to evaluate the deep venous system(s) from the level of the  common femoral vein through the popliteal and proximal calf veins. COMPARISON:  None Available. FINDINGS: VENOUS Normal compressibility of the common femoral, superficial femoral, and popliteal veins, as well as the visualized calf veins. Visualized portions of profunda femoral vein and great saphenous vein unremarkable. No filling defects to suggest DVT on grayscale or color Doppler imaging. Doppler waveforms show normal direction of venous flow, normal respiratory plasticity and response to augmentation. OTHER None. Limitations: none IMPRESSION: Negative for sonographic evidence of DVT in the bilateral lower extremities. Electronically Signed   By: Zetta Bills M.D.   On: 08/29/2021 14:15   CT CHEST W CONTRAST  Result Date: 08/29/2021 CLINICAL DATA:  Chronic cough for the past 8 weeks. Failed empiric treatment. EXAM: CT CHEST WITH CONTRAST TECHNIQUE: Multidetector CT imaging of the chest was performed during intravenous contrast  administration. RADIATION DOSE REDUCTION: This exam was performed according to the departmental dose-optimization program which includes automated exposure control, adjustment of the mA and/or kV according to patient size and/or use of iterative reconstruction technique. CONTRAST:  37mL OMNIPAQUE IOHEXOL 300 MG/ML  SOLN COMPARISON:  Chest radiograph-08/29/2021; 08/21/2021; 08/07/2021 FINDINGS: Cardiovascular: Normal heart size. Trace amount of pericardial fluid, presumably physiologic. No evidence of thoracic aortic aneurysm or dissection on this nongated examination. Although this examination was not tailored for the evaluation the pulmonary arteries, there are no discrete filling defects within the central pulmonary arterial tree to suggest central pulmonary embolism. Normal caliber of the main pulmonary artery. Mediastinum/Nodes: No bulky mediastinal, hilar or axillary lymph adenopathy. Lungs/Pleura: Moderate apical predominant centrilobular and paraseptal emphysematous change. Nonocclusive debris is seen within the main tracheal air column. There is circumferential wall thickening and short-segment occlusion predominantly involving the bilateral lower lobe segmental and subsegmental bronchi. Scattered ill-defined opacities are seen within the right upper lobe with dominant ill-defined consolidative opacity seen within the right upper lobe measuring approximally 1.8 x 1.7 x 1.6 cm (axial image 36, series 3; coronal image 86, series 5) with smaller consolidative opacities seen on images 46 and 72, series 3). Minimal right lower lobe subsegmental atelectasis, potentially the sequela of previous multiple right-sided rib fractures. No air bronchograms. No pleural effusion or pneumothorax. Punctate granuloma within the right upper lobe (image 40, series 3). No discrete worrisome pulmonary nodules given background parenchymal abnormalities. Upper Abdomen: Limited early arterial phase evaluation of the upper abdomen is  unremarkable. Musculoskeletal: Old rib fractures are seen bilaterally including the right sixth through eleventh ribs as well as the left ninth rib, several of which contain pseudoarthroses. Increased sclerosis are noted about several thoracic vertebral body endplates. DDD of C6-C7 with disc space height loss endplate irregularity and sclerosis. Regional soft tissues are normal. Normal appearance of the thyroid gland. IMPRESSION: 1. Circumferential wall thickening and short-segment occlusion predominantly affecting the bilateral lower lobe segmental and subsegmental bronchi most suggestive of airways disease with suspected developing areas of potential airways disseminated infection within the right upper lobe. Further evaluation with bronchoscopy could be performed as indicated. Follow-up chest CT after resolution of acute symptoms could be performed to ensure complete resolution and/or to establish a new baseline. 2.  Emphysema (ICD10-J43.9). 3. Multiple old rib fractures seen bilaterally, right greater than left, several of which demonstrates pseudoarthroses. Electronically Signed   By: Sandi Mariscal M.D.   On: 08/29/2021 12:19   DG Chest 2 View  Result Date: 08/29/2021 CLINICAL DATA:  Shortness of breath.  Ankle swelling. EXAM: CHEST - 2 VIEW COMPARISON:  None Available. FINDINGS: Cardiac silhouette and mediastinal  contours are within normal limits. The lungs are clear. No pleural effusion or pneumothorax. Mild multilevel degenerative disc changes of the thoracic spine. Old healed fractures are again seen within the posterior right sixth through ninth ribs. IMPRESSION: No active cardiopulmonary disease. Electronically Signed   By: Neita Garnet M.D.   On: 08/29/2021 08:33   DG Chest 2 View  Result Date: 08/21/2021 CLINICAL DATA:  Shortness of breath.  Face and leg swelling. EXAM: CHEST - 2 VIEW COMPARISON:  Chest x-ray dated August 07, 2021. FINDINGS: The heart size and mediastinal contours are within normal  limits. Normal pulmonary vascularity. No focal consolidation, pleural effusion, or pneumothorax. No acute osseous abnormality. Multiple old right-sided rib fractures again noted. IMPRESSION: 1. No acute cardiopulmonary disease. Electronically Signed   By: Obie Dredge M.D.   On: 08/21/2021 11:37   ECHOCARDIOGRAM COMPLETE  Result Date: 08/07/2021    ECHOCARDIOGRAM REPORT   Patient Name:   RAMON ZANDERS Date of Exam: 08/07/2021 Medical Rec #:  [id number]       Height:       65.0 in Accession #:    [id number]      Weight:       190.0 lb Date of Birth:  1980-09-21        BSA:          1.935 m Patient Age:    40 years        BP:           155/105 mmHg Patient Gender: M               HR:           102 bpm. Exam Location:  [facility] Procedure: 2D Echo Indications:     Dyspnea R06.00  History:         Patient has prior history of Echocardiogram examinations, most                  recent 12/06/2017.  Sonographer:     Overton Mam RDCS Referring Phys:  [id number] Denzil Magnuson PATEL Diagnosing Phys: Dietrich Pates MD IMPRESSIONS  1. Left ventricular ejection fraction, by estimation, is 55 to 60%. The left ventricle has normal function. The left ventricle has no regional wall motion abnormalities. Left ventricular diastolic parameters are indeterminate.  2. Right ventricular systolic function is normal. The right ventricular size is normal.  3. The mitral valve is normal in structure. Mild mitral valve regurgitation.  4. The aortic valve is tricuspid. Aortic valve regurgitation is not visualized.  5. The inferior vena cava is normal in size with greater than 50% respiratory variability, suggesting right atrial pressure of 3 mmHg. Comparison(s): The left ventricular function is unchanged. FINDINGS  Left Ventricle: Left ventricular ejection fraction, by estimation, is 55 to 60%. The left ventricle has normal function. The left ventricle has no regional wall motion abnormalities. The left ventricular internal cavity size was normal in  size. There is  no left ventricular hypertrophy. Left ventricular diastolic parameters are indeterminate. Right Ventricle: The right ventricular size is normal. Right vetricular wall thickness was not assessed. Right ventricular systolic function is normal. Left Atrium: Left atrial size was normal in size. Right Atrium: Right atrial size was normal in size. Pericardium: Trivial pericardial effusion is present. Mitral Valve: The mitral valve is normal in structure. Mild mitral valve regurgitation. Tricuspid Valve: The tricuspid valve is grossly normal. Tricuspid valve regurgitation is not demonstrated. Aortic Valve: The aortic valve is tricuspid. Aortic valve regurgitation  is not visualized. Aortic valve peak gradient measures 3.7 mmHg. Pulmonic Valve: The pulmonic valve was not well visualized. Pulmonic valve regurgitation is not visualized. No evidence of pulmonic stenosis. Aorta: The aortic root is normal in size and structure. Venous: The inferior vena cava is normal in size with greater than 50% respiratory variability, suggesting right atrial pressure of 3 mmHg. IAS/Shunts: No atrial level shunt detected by color flow Doppler.  LEFT VENTRICLE PLAX 2D LVIDd:         4.84 cm     Diastology LVIDs:         3.35 cm     LV e' medial:    9.68 cm/s LV PW:         1.19 cm     LV E/e' medial:  10.8 LV IVS:        0.94 cm     LV e' lateral:   15.10 cm/s LVOT diam:     2.10 cm     LV E/e' lateral: 7.0 LV SV:         52 LV SV Index:   27 LVOT Area:     3.46 cm  LV Volumes (MOD) LV vol d, MOD A2C: 90.6 ml LV vol d, MOD A4C: 77.6 ml LV vol s, MOD A2C: 42.5 ml LV vol s, MOD A4C: 32.1 ml LV SV MOD A2C:     48.1 ml LV SV MOD A4C:     77.6 ml LV SV MOD BP:      47.2 ml RIGHT VENTRICLE RV Basal diam:  3.12 cm RV S prime:     22.90 cm/s TAPSE (M-mode): 2.1 cm LEFT ATRIUM             Index        RIGHT ATRIUM           Index LA diam:        3.70 cm 1.91 cm/m   RA Area:     10.60 cm LA Vol (A2C):   48.1 ml 24.85 ml/m  RA Volume:    24.50 ml  12.66 ml/m LA Vol (A4C):   24.8 ml 12.81 ml/m LA Biplane Vol: 37.0 ml 19.12 ml/m  AORTIC VALVE                 PULMONIC VALVE AV Area (Vmax): 3.05 cm     PV Vmax:       0.96 m/s AV Vmax:        96.40 cm/s   PV Peak grad:  3.7 mmHg AV Peak Grad:   3.7 mmHg LVOT Vmax:      85.00 cm/s LVOT Vmean:     53.700 cm/s LVOT VTI:       0.149 m  AORTA Ao Root diam: 3.30 cm MITRAL VALVE MV Area (PHT): 8.07 cm     SHUNTS MV Decel Time: 94 msec      Systemic VTI:  0.15 m MV E velocity: 105.00 cm/s  Systemic Diam: 2.10 cm MV A velocity: 119.00 cm/s MV E/A ratio:  0.88 Dorris Carnes MD Electronically signed by Dorris Carnes MD Signature Date/Time: 08/07/2021/9:36:45 PM    Final    DG Chest Portable 1 View  Result Date: 08/07/2021 CLINICAL DATA:  Chest pain and short of breath EXAM: PORTABLE CHEST 1 VIEW COMPARISON:  01/28/2021 FINDINGS: Heart size and vascularity normal. Mild right lower lobe airspace disease has developed since the prior study. Left lung clear. No effusion. Chronic right rib fractures unchanged IMPRESSION:  Question early infiltrate right lower lobe Chronic right rib fractures Electronically Signed   By: Franchot Gallo M.D.   On: 08/07/2021 11:16    Microbiology: Recent Results (from the past 240 hour(s))  Respiratory (~20 pathogens) panel by PCR     Status: None   Collection Time: 08/29/21 12:52 PM   Specimen: Nasopharyngeal Swab; Respiratory  Result Value Ref Range Status   Adenovirus NOT DETECTED NOT DETECTED Final   Coronavirus 229E NOT DETECTED NOT DETECTED Final    Comment: (NOTE) The Coronavirus on the Respiratory Panel, DOES NOT test for the novel  Coronavirus (2019 nCoV)    Coronavirus HKU1 NOT DETECTED NOT DETECTED Final   Coronavirus NL63 NOT DETECTED NOT DETECTED Final   Coronavirus OC43 NOT DETECTED NOT DETECTED Final   Metapneumovirus NOT DETECTED NOT DETECTED Final   Rhinovirus / Enterovirus NOT DETECTED NOT DETECTED Final   Influenza A NOT DETECTED NOT DETECTED Final    Influenza B NOT DETECTED NOT DETECTED Final   Parainfluenza Virus 1 NOT DETECTED NOT DETECTED Final   Parainfluenza Virus 2 NOT DETECTED NOT DETECTED Final   Parainfluenza Virus 3 NOT DETECTED NOT DETECTED Final   Parainfluenza Virus 4 NOT DETECTED NOT DETECTED Final   Respiratory Syncytial Virus NOT DETECTED NOT DETECTED Final   Bordetella pertussis NOT DETECTED NOT DETECTED Final   Bordetella Parapertussis NOT DETECTED NOT DETECTED Final   Chlamydophila pneumoniae NOT DETECTED NOT DETECTED Final   Mycoplasma pneumoniae NOT DETECTED NOT DETECTED Final    Comment: Performed at [facility], [address], [place], [place] [address]  SARS Coronavirus 2 by RT PCR (hospital order, performed in [facility] hospital lab) *cepheid single result test* Anterior Nasal Swab     Status: None   Collection Time: 08/31/21  6:20 PM   Specimen: Anterior Nasal Swab  Result Value Ref Range Status   SARS Coronavirus 2 by RT PCR NEGATIVE NEGATIVE Final    Comment: (NOTE) SARS-CoV-2 target nucleic acids are NOT DETECTED.  The SARS-CoV-2 RNA is generally detectable in upper and lower respiratory specimens during the acute phase of infection. The lowest concentration of SARS-CoV-2 viral copies this assay can detect is 250 copies / mL. A negative result does not preclude SARS-CoV-2 infection and should not be used as the sole basis for treatment or other patient management decisions.  A negative result may occur with improper specimen collection / handling, submission of specimen other than nasopharyngeal swab, presence of viral mutation(s) within the areas targeted by this assay, and inadequate number of viral copies (<250 copies / mL). A negative result must be combined with clinical observations, patient history, and epidemiological information.  Fact Sheet for Patients:   https://www.patel.info/  Fact Sheet for Healthcare  Providers: https://hall.com/  This test is not yet approved or  cleared by the [place] FDA and has been authorized for detection and/or diagnosis of SARS-CoV-2 by FDA under an Emergency Use Authorization (EUA).  This EUA will remain in effect (meaning this test can be used) for the duration of the COVID-19 declaration under Section 564(b)(1) of the Act, 21 U.S.C. section 360bbb-3(b)(1), unless the authorization is terminated or revoked sooner.  Performed at [facility], [address] [place], [place] [address]      Labs: Basic Metabolic Panel: Recent Labs  Lab 08/29/21 0820 08/31/21 0514 09/01/21 0404 09/02/21 0410  NA 140 138 136 141  K 4.1 4.0 3.9 3.7  CL 107 99 100 101  CO2 28 32  33* 36*  GLUCOSE 99 121* 89 109*  BUN 26* 39* 36* 30*  CREATININE 1.05 1.08 1.03 1.12  CALCIUM 8.0* 8.0* 7.7* 7.9*  MG  --  2.4  --   --    Liver Function Tests: Recent Labs  Lab 08/29/21 0853  AST 27  ALT 16  ALKPHOS 57  BILITOT 0.9  PROT 5.3*  ALBUMIN 2.5*   No results for input(s): "LIPASE", "AMYLASE" in the last 168 hours. No results for input(s): "AMMONIA" in the last 168 hours. CBC: Recent Labs  Lab 08/29/21 0820 08/31/21 0514 09/01/21 0404  WBC 28.1* 30.7* 25.7*  HGB 14.6 14.4 14.6  HCT 46.3 45.3 46.6  MCV 82.1 79.6* 80.6  PLT 446* 429* 405*   Cardiac Enzymes: No results for input(s): "CKTOTAL", "CKMB", "CKMBINDEX", "TROPONINI" in the last 168 hours. BNP: BNP (last 3 results) Recent Labs    08/07/21 1041 08/21/21 1115 08/29/21 0853  BNP 23.2 12.3 26.3    ProBNP (last 3 results) No results for input(s): "PROBNP" in the last 8760 hours.  CBG: No results for input(s): "GLUCAP" in the last 168 hours.     Signed:  Desma Maxim MD.  [facility] 09/02/2021, 11:30 AM

## 2021-09-02 NOTE — Plan of Care (Signed)
  Problem: Health Behavior/Discharge Planning: Goal: Ability to manage health-related needs will improve Outcome: Progressing   Problem: Clinical Measurements: Goal: Ability to maintain clinical measurements within normal limits will improve Outcome: Progressing   Problem: Clinical Measurements: Goal: Respiratory complications will improve Outcome: Progressing   Problem: Safety: Goal: Ability to remain free from injury will improve Outcome: Progressing   

## 2021-09-03 LAB — FUNGITELL, SERUM: Fungitell Result: <31 pg/mL (ref <80)

## 2021-09-03 LAB — IGE: IgE (Immunoglobulin E), Serum: 341 IU/mL (ref 6–495)

## 2021-09-08 LAB — DRUG SCREEN 10 W/CONF, SERUM
Amphetamines, IA: NEGATIVE ng/mL
Barbiturates, IA: NEGATIVE ug/mL
Benzodiazepines, IA: NEGATIVE ng/mL
Cocaine & Metabolite, IA: NEGATIVE ng/mL
Methadone, IA: NEGATIVE ng/mL
Opiates, IA: NEGATIVE ng/mL
Oxycodones, IA: NEGATIVE ng/mL
Phencyclidine, IA: NEGATIVE ng/mL
Propoxyphene, IA: NEGATIVE ng/mL
THC(Marijuana) Metabolite, IA: POSITIVE ng/mL — AB

## 2021-09-08 LAB — HISTOPLASMA GAL'MANNAN AG SER: Histoplasma Gal'mannan Ag Ser: <0.5 (ref <0.5)

## 2021-09-08 LAB — THC,MS,WB/SP RFX
Cannabidiol: NEGATIVE ng/mL
Cannabinoid Confirmation: POSITIVE
Carboxy-THC: 19.4 ng/mL
Hydroxy-THC: 2.1 ng/mL
Tetrahydrocannabinol(THC): NEGATIVE ng/mL

## 2021-09-12 LAB — ASPERGILLUS ANTIGEN, BAL/SERUM: Aspergillus Ag, BAL/Serum: 0.35 Index (ref 0.00–0.49)

## 2022-03-15 ENCOUNTER — Emergency Department: Payer: Medicare Other

## 2022-03-15 ENCOUNTER — Inpatient Hospital Stay
Admission: EM | Admit: 2022-03-15 | Discharge: 2022-03-18 | DRG: 464 | Disposition: A | Discharge status: Home / Self Care | Payer: Medicare Other | Attending: Internal Medicine | Admitting: Internal Medicine

## 2022-03-15 ENCOUNTER — Other Ambulatory Visit: Payer: Self-pay

## 2022-03-15 ENCOUNTER — Encounter: Payer: Self-pay | Admitting: Internal Medicine

## 2022-03-15 DIAGNOSIS — L309 Dermatitis, unspecified: Secondary | ICD-10-CM | POA: Diagnosis present

## 2022-03-15 DIAGNOSIS — Z8249 Family history of ischemic heart disease and other diseases of the circulatory system: Secondary | ICD-10-CM

## 2022-03-15 DIAGNOSIS — M71121 Other infective bursitis, right elbow: Principal | ICD-10-CM | POA: Diagnosis present

## 2022-03-15 DIAGNOSIS — Z880 Allergy status to penicillin: Secondary | ICD-10-CM | POA: Diagnosis not present

## 2022-03-15 DIAGNOSIS — I10 Essential (primary) hypertension: Secondary | ICD-10-CM | POA: Diagnosis present

## 2022-03-15 DIAGNOSIS — M7021 Olecranon bursitis, right elbow: Secondary | ICD-10-CM | POA: Diagnosis present

## 2022-03-15 DIAGNOSIS — Z91014 Allergy to mammalian meats: Secondary | ICD-10-CM

## 2022-03-15 DIAGNOSIS — Z7951 Long term (current) use of inhaled steroids: Secondary | ICD-10-CM

## 2022-03-15 DIAGNOSIS — L03113 Cellulitis of right upper limb: Secondary | ICD-10-CM | POA: Diagnosis present

## 2022-03-15 DIAGNOSIS — Z79899 Other long term (current) drug therapy: Secondary | ICD-10-CM

## 2022-03-15 DIAGNOSIS — Z91018 Allergy to other foods: Secondary | ICD-10-CM

## 2022-03-15 DIAGNOSIS — F17200 Nicotine dependence, unspecified, uncomplicated: Secondary | ICD-10-CM | POA: Diagnosis present

## 2022-03-15 DIAGNOSIS — J4550 Severe persistent asthma, uncomplicated: Secondary | ICD-10-CM | POA: Diagnosis present

## 2022-03-15 DIAGNOSIS — I890 Lymphedema, not elsewhere classified: Secondary | ICD-10-CM | POA: Diagnosis present

## 2022-03-15 DIAGNOSIS — E669 Obesity, unspecified: Secondary | ICD-10-CM | POA: Diagnosis present

## 2022-03-15 DIAGNOSIS — M60031 Infective myositis, right forearm: Secondary | ICD-10-CM

## 2022-03-15 DIAGNOSIS — Z6830 Body mass index (BMI) 30.0-30.9, adult: Secondary | ICD-10-CM

## 2022-03-15 LAB — BASIC METABOLIC PANEL
Anion gap: 10 (ref 5–15)
BUN: 24 mg/dL — ABNORMAL HIGH (ref 6–20)
CO2: 30 mmol/L (ref 22–32)
Calcium: 8.5 mg/dL — ABNORMAL LOW (ref 8.9–10.3)
Chloride: 101 mmol/L (ref 98–111)
Creatinine, Ser: 1.01 mg/dL (ref 0.61–1.24)
GFR, Estimated: >60 mL/min (ref >60)
Glucose, Bld: 100 mg/dL — ABNORMAL HIGH (ref 70–99)
Potassium: 3.6 mmol/L (ref 3.5–5.1)
Sodium: 141 mmol/L (ref 135–145)

## 2022-03-15 LAB — SEDIMENTATION RATE: Sed Rate: 27 mm/hr — ABNORMAL HIGH (ref 0–15)

## 2022-03-15 LAB — CBC
HCT: 45.7 % (ref 39.0–52.0)
Hemoglobin: 14 g/dL (ref 13.0–17.0)
MCH: 25.5 pg — ABNORMAL LOW (ref 26.0–34.0)
MCHC: 30.6 g/dL (ref 30.0–36.0)
MCV: 83.4 fL (ref 80.0–100.0)
Platelets: 478 10*3/uL — ABNORMAL HIGH (ref 150–400)
RBC: 5.48 MIL/uL (ref 4.22–5.81)
RDW: 16.4 % — ABNORMAL HIGH (ref 11.5–15.5)
WBC: 25.6 10*3/uL — ABNORMAL HIGH (ref 4.0–10.5)
nRBC: 0 % (ref 0.0–0.2)

## 2022-03-15 LAB — LACTIC ACID, PLASMA
Lactic Acid, Venous: 1.5 mmol/L (ref 0.5–1.9)
Lactic Acid, Venous: 0.9 mmol/L (ref 0.5–1.9)

## 2022-03-15 MED ORDER — VANCOMYCIN HCL 2000 MG/400ML IV SOLN
2000.0000 mg | Freq: Once | INTRAVENOUS | Status: AC
  Administered 2022-03-15: 2000 mg via INTRAVENOUS
  Filled 2022-03-15: qty 400

## 2022-03-15 MED ORDER — POLYETHYLENE GLYCOL 3350 17 G PO PACK: 17.0000 g | PACK | Freq: Every day | ORAL | Status: DC | PRN

## 2022-03-15 MED ORDER — IOHEXOL 300 MG/ML  SOLN
100.0000 mL | Freq: Once | INTRAMUSCULAR | Status: AC | PRN
  Administered 2022-03-15: 100 mL via INTRAVENOUS

## 2022-03-15 MED ORDER — ONDANSETRON HCL 4 MG/2ML IJ SOLN
4.0000 mg | Freq: Four times a day (QID) | INTRAMUSCULAR | Status: DC | PRN
  Administered 2022-03-16: 4 mg via INTRAVENOUS
  Filled 2022-03-15: qty 2

## 2022-03-15 MED ORDER — ACETAMINOPHEN 650 MG RE SUPP: 650.0000 mg | Freq: Four times a day (QID) | RECTAL | Status: DC | PRN

## 2022-03-15 MED ORDER — MOMETASONE FURO-FORMOTEROL FUM 200-5 MCG/ACT IN AERO
2.0000 | INHALATION_SPRAY | Freq: Two times a day (BID) | RESPIRATORY_TRACT | Status: DC
  Administered 2022-03-15 – 2022-03-18 (×6): 2 via RESPIRATORY_TRACT
  Filled 2022-03-15: qty 8.8

## 2022-03-15 MED ORDER — ACETAMINOPHEN 325 MG PO TABS: 650.0000 mg | ORAL_TABLET | Freq: Four times a day (QID) | ORAL | Status: DC | PRN

## 2022-03-15 MED ORDER — HYDROCODONE-ACETAMINOPHEN 5-325 MG PO TABS
1.0000 | ORAL_TABLET | ORAL | Status: DC | PRN
  Administered 2022-03-15: 1 via ORAL
  Administered 2022-03-16 – 2022-03-18 (×6): 2 via ORAL
  Filled 2022-03-15 (×5): qty 2
  Filled 2022-03-15: qty 1
  Filled 2022-03-15: qty 2

## 2022-03-15 MED ORDER — LOSARTAN POTASSIUM 50 MG PO TABS
100.0000 mg | ORAL_TABLET | Freq: Every day | ORAL | Status: DC
  Administered 2022-03-16 – 2022-03-18 (×3): 100 mg via ORAL
  Filled 2022-03-15 (×3): qty 2

## 2022-03-15 MED ORDER — ALBUTEROL SULFATE (2.5 MG/3ML) 0.083% IN NEBU
2.5000 mg | INHALATION_SOLUTION | RESPIRATORY_TRACT | Status: DC | PRN
  Administered 2022-03-15: 2.5 mg via RESPIRATORY_TRACT
  Filled 2022-03-15: qty 3

## 2022-03-15 MED ORDER — PREDNISONE 5 MG PO TABS
7.5000 mg | ORAL_TABLET | Freq: Every day | ORAL | Status: DC
  Administered 2022-03-16 – 2022-03-18 (×3): 7.5 mg via ORAL
  Filled 2022-03-15 (×3): qty 1

## 2022-03-15 MED ORDER — IPRATROPIUM-ALBUTEROL 0.5-2.5 (3) MG/3ML IN SOLN
3.0000 mL | RESPIRATORY_TRACT | Status: DC
  Administered 2022-03-15: 3 mL via RESPIRATORY_TRACT
  Filled 2022-03-15: qty 3

## 2022-03-15 MED ORDER — SODIUM CHLORIDE 0.9% FLUSH
3.0000 mL | Freq: Two times a day (BID) | INTRAVENOUS | Status: DC
  Administered 2022-03-15 – 2022-03-18 (×6): 3 mL via INTRAVENOUS

## 2022-03-15 MED ORDER — IPRATROPIUM-ALBUTEROL 0.5-2.5 (3) MG/3ML IN SOLN
3.0000 mL | Freq: Four times a day (QID) | RESPIRATORY_TRACT | Status: DC
  Administered 2022-03-16 – 2022-03-17 (×3): 3 mL via RESPIRATORY_TRACT
  Filled 2022-03-15 (×4): qty 3

## 2022-03-15 MED ORDER — MONTELUKAST SODIUM 10 MG PO TABS
10.0000 mg | ORAL_TABLET | Freq: Every day | ORAL | Status: DC
  Administered 2022-03-15 – 2022-03-17 (×3): 10 mg via ORAL
  Filled 2022-03-15 (×3): qty 1

## 2022-03-15 MED ORDER — ONDANSETRON HCL 4 MG PO TABS: 4.0000 mg | ORAL_TABLET | Freq: Four times a day (QID) | ORAL | Status: DC | PRN

## 2022-03-15 MED ORDER — SODIUM CHLORIDE 0.9 % IV BOLUS
1000.0000 mL | Freq: Once | INTRAVENOUS | Status: AC
  Administered 2022-03-15: 1000 mL via INTRAVENOUS

## 2022-03-15 MED ORDER — TOBRAMYCIN 0.3 % OP SOLN: 1.0000 [drp] | Freq: Three times a day (TID) | OPHTHALMIC | Status: DC

## 2022-03-15 MED ORDER — TIZANIDINE HCL 2 MG PO TABS
2.0000 mg | ORAL_TABLET | Freq: Three times a day (TID) | ORAL | Status: DC | PRN
  Filled 2022-03-15: qty 1

## 2022-03-15 MED ORDER — HYDROCHLOROTHIAZIDE 25 MG PO TABS
25.0000 mg | ORAL_TABLET | Freq: Every day | ORAL | Status: DC
  Administered 2022-03-16 – 2022-03-18 (×3): 25 mg via ORAL
  Filled 2022-03-15 (×3): qty 1

## 2022-03-15 MED ORDER — HYDROMORPHONE HCL 1 MG/ML IJ SOLN
1.0000 mg | INTRAMUSCULAR | Status: DC | PRN
  Administered 2022-03-15 – 2022-03-16 (×4): 1 mg via INTRAVENOUS
  Filled 2022-03-15 (×4): qty 1

## 2022-03-15 MED ORDER — SODIUM CHLORIDE 0.9 % IV SOLN
2.0000 g | INTRAVENOUS | Status: DC
  Administered 2022-03-15 – 2022-03-17 (×3): 2 g via INTRAVENOUS
  Filled 2022-03-15: qty 2
  Filled 2022-03-15: qty 20
  Filled 2022-03-15: qty 2
  Filled 2022-03-15: qty 20

## 2022-03-15 MED ORDER — ENOXAPARIN SODIUM 40 MG/0.4ML IJ SOSY
40.0000 mg | PREFILLED_SYRINGE | INTRAMUSCULAR | Status: DC
  Filled 2022-03-15: qty 0.4

## 2022-03-15 NOTE — Assessment & Plan Note (Signed)
-  Continue home bronchodilators, prednisone and Dupixent - Albuterol nebs as needed every 2 hours

## 2022-03-15 NOTE — Consult Note (Signed)
ORTHOPAEDIC CONSULTATION  REQUESTING PHYSICIAN: Verdene Lennert, MD  Chief Complaint:   R elbow/forearm/hand pain and swelling  History of Present Illness: Joseph [name] is a 42 y.o. male who is right-hand dominant who presents with right upper extremity swelling and pain.  Approximately 2 weeks ago, he bumped his elbow and then a few days later noted pain and swelling about the forearm and over the olecranon.  He states that he had an olecranon bursitis on the contralateral elbow and that self improved with NSAIDs and wearing a sling for short period of time.  However, on the right side, his symptoms continue to worsen with increased erythema, pain, and swelling affecting the hand, forearm, and olecranon region.  DVT scan in the ED was negative.  CT scan showed olecranon bursitis with significant soft tissue swelling about the forearm and hand.  He also had a white blood count of 25.6 which is not uncommon for him.  He does have a history of hypertension and significant asthma.  He lives at home with his wife.  He is now retired.  He does smoke daily.  Past Medical History:  Diagnosis Date   Asthma    COPD (chronic obstructive pulmonary disease) (HCC)    Eczema    Hypertension    No past surgical history on file. Social History   Socioeconomic History   Marital status: Married    Spouse name: Not on file   Number of children: Not on file   Years of education: Not on file   Highest education level: Not on file  Occupational History   Not on file  Tobacco Use   Smoking status: Every Day   Smokeless tobacco: Never  Substance and Sexual Activity   Alcohol use: No   Drug use: Yes    Types: Marijuana   Sexual activity: Not on file  Other Topics Concern   Not on file  Social History Narrative   Not on file   Social Determinants of Health   Financial Resource Strain: Not on file  Food Insecurity: Not on file   Transportation Needs: Not on file  Physical Activity: Not on file  Stress: Not on file  Social Connections: Not on file   Family History  Problem Relation Age of Onset   Hypertension Mother    Allergies  Allergen Reactions   Penicillins Shortness Of Breath   Pork-Derived Products Nausea Only   Banana Hives   Coconut (Cocos Nucifera) Hives   Prior to Admission medications   Medication Sig Start Date End Date Taking? Authorizing Provider  albuterol (PROVENTIL HFA;VENTOLIN HFA) 108 (90 Base) MCG/ACT inhaler Inhale 2 puffs into the lungs every 6 (six) hours as needed for wheezing or shortness of breath.   Yes [provider]  albuterol (PROVENTIL) (2.5 MG/3ML) 0.083% nebulizer solution Take 2.5 mg by nebulization every 6 (six) hours as needed for wheezing or shortness of breath.   Yes [provider]  dupilumab (DUPIXENT) 300 MG/2ML prefilled syringe Inject 300 mg into the skin every 14 (fourteen) days.   Yes [provider]  fluticasone-salmeterol (ADVAIR HFA) 230-21 MCG/ACT inhaler Inhale 2 puffs into the lungs every 12 (twelve) hours.   Yes [provider]  furosemide (LASIX) 20 MG tablet Take 20 mg by mouth daily as needed for edema or fluid.   Yes [provider]  hydrochlorothiazide (HYDRODIURIL) 25 MG tablet Take 25 mg by mouth daily. 09/24/20  Yes [provider]  ibuprofen (ADVIL) 800 MG tablet Take  800 mg by mouth daily as needed for fever, headache, mild pain, moderate pain or cramping. 12/19/21  Yes [provider]  losartan (COZAAR) 100 MG tablet Take 1 tablet (100 mg total) by mouth daily. 09/03/21  Yes Wouk, Ailene Rud, MD  montelukast (SINGULAIR) 10 MG tablet Take 10 mg by mouth at bedtime.   Yes [provider]  omeprazole (PRILOSEC) 20 MG capsule Take 20 mg by mouth daily as needed (GERD symptoms).   Yes [provider]  predniSONE (DELTASONE) 2.5 MG tablet Take 7.5 mg by mouth daily.   Yes  [provider]  tiZANidine (ZANAFLEX) 2 MG tablet Take 2 mg by mouth 3 (three) times daily.   Yes [provider]  tobramycin (TOBREX) 0.3 % ophthalmic solution Place 1-2 drops into the right eye in the morning, at noon, in the evening, and at bedtime. 08/27/21  Yes [provider]   Recent Labs    03/15/22 1042  WBC 25.6*  HGB 14.0  HCT 45.7  PLT 478*  K 3.6  CL 101  CO2 30  BUN 24*  CREATININE 1.01  GLUCOSE 100*  CALCIUM 8.5*   CT FOREARM RIGHT W CONTRAST  Result Date: 03/15/2022 CLINICAL DATA:  Pain and swelling. EXAM: CT OF THE UPPER RIGHT EXTREMITY WITH CONTRAST TECHNIQUE: Multidetector CT imaging of the upper right extremity was performed according to the standard protocol following intravenous contrast administration. RADIATION DOSE REDUCTION: This exam was performed according to the departmental dose-optimization program which includes automated exposure control, adjustment of the mA and/or kV according to patient size and/or use of iterative reconstruction technique. CONTRAST:  169mL OMNIPAQUE IOHEXOL 300 MG/ML  SOLN COMPARISON:  Radiographs, same date. FINDINGS: Severe diffuse subcutaneous soft tissue swelling/edema/fluid involving the entire imaged right upper extremity from just above the elbow joint down to the hand. Findings consistent with severe cellulitis. At the level of the elbow there is a rim enhancing fluid collection in the region of the olecranon bursa highly suspicious for septic bursitis. This fluid collection measures a maximum of 14 x 6.6 x 3.5 cm. There is also a moderate-sized elbow joint effusion. Could not exclude septic arthritis. No destructive bony changes to suggest osteomyelitis. Signal abnormality in the distal triceps muscle is suspicious for myofasciitis and could not exclude pyomyositis. Do not see any definite findings for myofasciitis or pyomyositis involving the forearm musculature. The wrist joint is maintained. No findings  suspicious for septic arthritis or osteomyelitis involving the wrist, hand or radius/ulna. The major vascular structures are patent. IMPRESSION: 1. Severe diffuse subcutaneous soft tissue swelling/edema/fluid involving the entire imaged right upper extremity from just above the elbow joint down to the hand. Findings consistent with severe cellulitis. 2. Rim enhancing fluid collection in the region of the olecranon bursa highly suspicious for septic bursitis. 3. Moderate-sized elbow joint effusion. Could not exclude septic arthritis. 4. Suspect inflammation/edema in the distal triceps muscle, suspicious for myofasciitis and could not exclude pyomyositis. 5. No findings suspicious for septic arthritis or osteomyelitis involving the wrist, hand or radius/ulna. Electronically Signed   By: Marijo Sanes M.D.   On: 03/15/2022 13:59   US Venous Img Upper Right (DVT Study)  Result Date: 03/15/2022 CLINICAL DATA:  Right upper extremity edema for the past 2 weeks. Evaluate for DVT. EXAM: RIGHT UPPER EXTREMITY VENOUS DOPPLER ULTRASOUND TECHNIQUE: Gray-scale sonography with graded compression, as well as color Doppler and duplex ultrasound were performed to evaluate the upper extremity deep venous system from the level of  the subclavian vein and including the jugular, axillary, basilic, radial, ulnar and upper cephalic vein. Spectral Doppler was utilized to evaluate flow at rest and with distal augmentation maneuvers. COMPARISON:  None Available. FINDINGS: Contralateral Subclavian Vein: Respiratory phasicity is normal and symmetric with the symptomatic side. No evidence of thrombus. Normal compressibility. Internal Jugular Vein: No evidence of thrombus. Normal compressibility, respiratory phasicity and response to augmentation. Subclavian Vein: No evidence of thrombus. Normal compressibility, respiratory phasicity and response to augmentation. Axillary Vein: No evidence of thrombus. Normal compressibility, respiratory  phasicity and response to augmentation. Cephalic Vein: No evidence of thrombus. Normal compressibility, respiratory phasicity and response to augmentation. Basilic Vein: No evidence of thrombus. Normal compressibility, respiratory phasicity and response to augmentation. Brachial Veins: No evidence of thrombus. Normal compressibility, respiratory phasicity and response to augmentation. Radial Veins: No evidence of thrombus. Normal compressibility, respiratory phasicity and response to augmentation. Ulnar Veins: No evidence of thrombus. Normal compressibility, respiratory phasicity and response to augmentation. Other Findings: There is a minimal amount of subcutaneous edema at the level of the forearm. IMPRESSION: No evidence of DVT within the right upper extremity. Electronically Signed   By: Sandi Mariscal M.D.   On: 03/15/2022 12:36   DG Forearm Right  Result Date: 03/15/2022 CLINICAL DATA:  Right arm pain and swelling after injury. EXAM: RIGHT FOREARM - 2 VIEW COMPARISON:  None Available. FINDINGS: There is no evidence of fracture or other focal bone lesions. Soft tissues are unremarkable. IMPRESSION: Negative. Electronically Signed   By: Marijo Conception M.D.   On: 03/15/2022 11:43   DG Hand Complete Right  Result Date: 03/15/2022 CLINICAL DATA:  Right arm pain and swelling after injury. EXAM: RIGHT HAND - COMPLETE 3+ VIEW COMPARISON:  None Available. FINDINGS: There is no evidence of fracture or dislocation. There is no evidence of arthropathy or other focal bone abnormality. Soft tissues are unremarkable. IMPRESSION: Negative. Electronically Signed   By: Marijo Conception M.D.   On: 03/15/2022 11:41     Positive ROS: All other systems have been reviewed and were otherwise negative with the exception of those mentioned in the HPI and as above.  Physical Exam: BP (!) 142/102 (BP Location: Left Arm)   Pulse 97   Temp 98.1 F (36.7 C)   Resp 19   SpO2 94%  General:  Alert, no acute  distress Psychiatric:  Patient is competent for consent with normal mood and affect   Cardiovascular:  No pedal edema, regular rate and rhythm Respiratory:  No wheezing, non-labored breathing GI:  Abdomen is soft and non-tender Skin:  No lesions in the area of chief complaint, no erythema Neurologic:  Sensation intact distally, CN grossly intact Lymphatic:  No axillary or cervical lymphadenopathy  Orthopedic Exam:  RUE: +ain/pin/u motor.  5/5 bicep strength, 4/5 triceps strength SILT r/u/m/ax +rad pulse RoM elbow: 10-100 degrees elbow flexion/extension actively and passively.  Pain at end ranges of motion.  70/70 supination/pronation actively without significant pain.  There is also significant edema about the hand and forearm diffusely.  There is erythema about the proximal forearm about the olecranon and this radiates distally.  There is also some fluctuance about the elbow consistent with olecranon bursitis.  There is significant tenderness to palpation in this region.  No gross purulence or breaks in the skin noted.   Imaging:  As above: As noted above there is olecranon bursitis concerning for infection.  There is also significant subcutaneous swelling about the forearm and hand  Assessment/Plan:  42 year old male with likely infected/septic olecranon situs.  The elbow joint itself does not appear to be infected given his current range of motion. 1.  Continue IV antibiotics overnight and reassess for improvement in the morning.  If there is significant improvement in the patient's clinical symptoms, we concluded continue treatment with antibiotic management.  However, if he is not better, given diffuse cellulitis, and findings on clinical exam with CT scan suggesting infected bursa, we can plan for irrigation and debridement of the right olecranon bursa in the operating room tomorrow.  2.  N.p.o. after midnight.  Hold anticoagulation in advance of the OR.     Leim Fabry   03/15/2022 4:43 PM

## 2022-03-15 NOTE — Assessment & Plan Note (Signed)
Patient's wife is concerned regarding patient's bilateral lower extremity edema for which workup has been negative including cardiac.  She states that the swelling goes down with compression stockings but returns immediately on removal compression stockings.  Most consistent with lymphedema versus chronic venous stasis.  - Continue compression stocking - Consider referral for lymphoscintigraphy

## 2022-03-15 NOTE — ED Triage Notes (Signed)
First Nurse Note:  Arrives C.O right arm swelling.  AAOx3.  Skin warm an dry. NAD

## 2022-03-15 NOTE — Assessment & Plan Note (Signed)
Patient is presenting with significant forearm and hand swelling in the setting of possible trauma approximately 2 weeks ago.  CT with evidence of severe cellulitis and likely myofasciitis and septic bursitis.  Given immunocompromise status and progressive nature of symptoms, patient will require parenteral therapy.  Given high risk for sepsis, will monitor on telemetry overnight.  - Vancomycin per pharmacy dosing - Continue Rocephin 2 g daily - Tylenol as needed for fever - Norco and Dilaudid for pain control - Monitor for signs

## 2022-03-15 NOTE — Assessment & Plan Note (Signed)
-  Continue home antihypertensives 

## 2022-03-15 NOTE — ED Notes (Signed)
ED TO INPATIENT HANDOFF REPORT  ED Nurse Name and Phone #: ED  S Name/Age/Gender Joseph [name] 42 y.o. male Room/Bed: ED47A/ED47A  Code Status   Code Status: Prior  Home/SNF/Other Home Patient oriented to: self, place, time, and situation Is this baseline? Yes   Triage Complete: Triage complete  Chief Complaint Cellulitis of arm, right [Z61.096]  Triage Note First Nurse Note:  Arrives C.O right arm swelling.  AAOx3.  Skin warm an dry. NAD  Pt comes with c/o right arm swelling. Pt states he hit his right elbow 2 weeks ago. Pt states he ahs gotten worse more each day. Pt states he used ice but swelling continued. Pt has obvious swelling, redness and warmth.    Allergies Allergies  Allergen Reactions   Penicillins Shortness Of Breath   Pork-Derived Products Nausea Only   Banana Hives    Level of Care/Admitting Diagnosis ED Disposition     ED Disposition  Admit   Condition  --   Comment  Hospital Area: [facility] [[id number]]  Level of Care: Telemetry Medical [104]  Covid Evaluation: Asymptomatic - no recent exposure (last 10 days) testing not required  Diagnosis: Cellulitis of arm, right [045409]  Admitting Physician: Jose Persia [id number]  Attending Physician: Jose Persia [id number]  Certification:: I certify this patient will need inpatient services for at least 2 midnights  Estimated Length of Stay: 3          B Medical/Surgery History Past Medical History:  Diagnosis Date   Asthma    COPD (chronic obstructive pulmonary disease) ([place])    Eczema    Hypertension    No past surgical history on file.   A IV Location/Drains/Wounds Patient Lines/Drains/Airways Status     Active Line/Drains/Airways     Name Placement date Placement time Site Days   Peripheral IV 03/15/22 20 G Left;Posterior;Proximal Forearm 03/15/22  1149  Forearm  less than 1            Intake/Output Last 24 hours  Intake/Output Summary (Last  24 hours) at 03/15/2022 1430 Last data filed at 03/15/2022 1300 Gross per 24 hour  Intake 1000 ml  Output --  Net 1000 ml    Labs/Imaging Results for orders placed or performed during the hospital encounter of 03/15/22 (from the past 48 hour(s))  CBC     Status: Abnormal   Collection Time: 03/15/22 10:42 AM  Result Value Ref Range   WBC 25.6 (H) 4.0 - 10.5 K/uL   RBC 5.48 4.22 - 5.81 MIL/uL   Hemoglobin 14.0 13.0 - 17.0 g/dL   HCT 45.7 39.0 - 52.0 %   MCV 83.4 80.0 - 100.0 fL   MCH 25.5 (L) 26.0 - 34.0 pg   MCHC 30.6 30.0 - 36.0 g/dL   RDW 16.4 (H) 11.5 - 15.5 %   Platelets 478 (H) 150 - 400 K/uL   nRBC 0.0 0.0 - 0.2 %    Comment: Performed at [facility], [address] [place], [place] [address]  Basic metabolic panel     Status: Abnormal   Collection Time: 03/15/22 10:42 AM  Result Value Ref Range   Sodium 141 135 - 145 mmol/L   Potassium 3.6 3.5 - 5.1 mmol/L   Chloride 101 98 - 111 mmol/L   CO2 30 22 - 32 mmol/L   Glucose, Bld 100 (H) 70 - 99 mg/dL    Comment: Glucose reference range applies only to samples taken after fasting for at least 8 hours.  BUN 24 (H) 6 - 20 mg/dL   Creatinine, Ser 1.01 0.61 - 1.24 mg/dL   Calcium 8.5 (L) 8.9 - 10.3 mg/dL   GFR, Estimated >60 >60 mL/min    Comment: (NOTE) Calculated using the CKD-EPI Creatinine Equation (2021)    Anion gap 10 5 - 15    Comment: Performed at [facility], [address] [place], [place] [address]  Lactic acid, plasma     Status: None   Collection Time: 03/15/22 10:42 AM  Result Value Ref Range   Lactic Acid, Venous 1.5 0.5 - 1.9 mmol/L    Comment: Performed at [facility], [address] [place], [place] [address]  Sedimentation rate     Status: Abnormal   Collection Time: 03/15/22 10:42 AM  Result Value Ref Range   Sed Rate 27 (H) 0 - 15 mm/hr    Comment: Performed at [facility], [address] [place], [place] [address]  Lactic acid, plasma     Status:  None   Collection Time: 03/15/22 12:50 PM  Result Value Ref Range   Lactic Acid, Venous 0.9 0.5 - 1.9 mmol/L    Comment: Performed at [facility], [address] [place], [place] [address]   CT FOREARM RIGHT W CONTRAST  Result Date: 03/15/2022 CLINICAL DATA:  Pain and swelling. EXAM: CT OF THE UPPER RIGHT EXTREMITY WITH CONTRAST TECHNIQUE: Multidetector CT imaging of the upper right extremity was performed according to the standard protocol following intravenous contrast administration. RADIATION DOSE REDUCTION: This exam was performed according to the departmental dose-optimization program which includes automated exposure control, adjustment of the mA and/or kV according to patient size and/or use of iterative reconstruction technique. CONTRAST:  161mL OMNIPAQUE IOHEXOL 300 MG/ML  SOLN COMPARISON:  Radiographs, same date. FINDINGS: Severe diffuse subcutaneous soft tissue swelling/edema/fluid involving the entire imaged right upper extremity from just above the elbow joint down to the hand. Findings consistent with severe cellulitis. At the level of the elbow there is a rim enhancing fluid collection in the region of the olecranon bursa highly suspicious for septic bursitis. This fluid collection measures a maximum of 14 x 6.6 x 3.5 cm. There is also a moderate-sized elbow joint effusion. Could not exclude septic arthritis. No destructive bony changes to suggest osteomyelitis. Signal abnormality in the distal triceps muscle is suspicious for myofasciitis and could not exclude pyomyositis. Do not see any definite findings for myofasciitis or pyomyositis involving the forearm musculature. The wrist joint is maintained. No findings suspicious for septic arthritis or osteomyelitis involving the wrist, hand or radius/ulna. The major vascular structures are patent. IMPRESSION: 1. Severe diffuse subcutaneous soft tissue swelling/edema/fluid involving the entire imaged right upper extremity from just  above the elbow joint down to the hand. Findings consistent with severe cellulitis. 2. Rim enhancing fluid collection in the region of the olecranon bursa highly suspicious for septic bursitis. 3. Moderate-sized elbow joint effusion. Could not exclude septic arthritis. 4. Suspect inflammation/edema in the distal triceps muscle, suspicious for myofasciitis and could not exclude pyomyositis. 5. No findings suspicious for septic arthritis or osteomyelitis involving the wrist, hand or radius/ulna. Electronically Signed   By: Marijo Sanes M.D.   On: 03/15/2022 13:59   US Venous Img Upper Right (DVT Study)  Result Date: 03/15/2022 CLINICAL DATA:  Right upper extremity edema for the past 2 weeks. Evaluate for DVT. EXAM: RIGHT UPPER EXTREMITY VENOUS DOPPLER ULTRASOUND TECHNIQUE: Gray-scale sonography with graded compression, as well as color Doppler and duplex  ultrasound were performed to evaluate the upper extremity deep venous system from the level of the subclavian vein and including the jugular, axillary, basilic, radial, ulnar and upper cephalic vein. Spectral Doppler was utilized to evaluate flow at rest and with distal augmentation maneuvers. COMPARISON:  None Available. FINDINGS: Contralateral Subclavian Vein: Respiratory phasicity is normal and symmetric with the symptomatic side. No evidence of thrombus. Normal compressibility. Internal Jugular Vein: No evidence of thrombus. Normal compressibility, respiratory phasicity and response to augmentation. Subclavian Vein: No evidence of thrombus. Normal compressibility, respiratory phasicity and response to augmentation. Axillary Vein: No evidence of thrombus. Normal compressibility, respiratory phasicity and response to augmentation. Cephalic Vein: No evidence of thrombus. Normal compressibility, respiratory phasicity and response to augmentation. Basilic Vein: No evidence of thrombus. Normal compressibility, respiratory phasicity and response to augmentation.  Brachial Veins: No evidence of thrombus. Normal compressibility, respiratory phasicity and response to augmentation. Radial Veins: No evidence of thrombus. Normal compressibility, respiratory phasicity and response to augmentation. Ulnar Veins: No evidence of thrombus. Normal compressibility, respiratory phasicity and response to augmentation. Other Findings: There is a minimal amount of subcutaneous edema at the level of the forearm. IMPRESSION: No evidence of DVT within the right upper extremity. Electronically Signed   By: Sandi Mariscal M.D.   On: 03/15/2022 12:36   DG Forearm Right  Result Date: 03/15/2022 CLINICAL DATA:  Right arm pain and swelling after injury. EXAM: RIGHT FOREARM - 2 VIEW COMPARISON:  None Available. FINDINGS: There is no evidence of fracture or other focal bone lesions. Soft tissues are unremarkable. IMPRESSION: Negative. Electronically Signed   By: Marijo Conception M.D.   On: 03/15/2022 11:43   DG Hand Complete Right  Result Date: 03/15/2022 CLINICAL DATA:  Right arm pain and swelling after injury. EXAM: RIGHT HAND - COMPLETE 3+ VIEW COMPARISON:  None Available. FINDINGS: There is no evidence of fracture or dislocation. There is no evidence of arthropathy or other focal bone abnormality. Soft tissues are unremarkable. IMPRESSION: Negative. Electronically Signed   By: Marijo Conception M.D.   On: 03/15/2022 11:41    Pending Labs Unresulted Labs (From admission, onward)    None       Vitals/Pain Today's Vitals   03/15/22 1045 03/15/22 1049 03/15/22 1200 03/15/22 1230  BP:  (!) 152/107 (!) 129/93 (!) 147/97  Pulse:  (!) 117 (!) 101 (!) 101  Resp:  18 19 18   Temp:  98 F (36.7 C)    SpO2:  96% 94% 93%  PainSc: 10-Worst pain ever       Isolation Precautions No active isolations  Medications Medications  cefTRIAXone (ROCEPHIN) 2 g in sodium chloride 0.9 % 100 mL IVPB (2 g Intravenous New Bag/Given 03/15/22 1414)  vancomycin (VANCOREADY) IVPB 2000 mg/400 mL (has no  administration in time range)  sodium chloride 0.9 % bolus 1,000 mL (0 mLs Intravenous Stopped 03/15/22 1300)  iohexol (OMNIPAQUE) 300 MG/ML solution 100 mL (100 mLs Intravenous Contrast Given 03/15/22 1325)    Mobility walks     Focused Assessments    R Recommendations: See Admitting Provider Note  Report given to:   Additional Notes: please call with any questions or concerns

## 2022-03-15 NOTE — Assessment & Plan Note (Addendum)
CT imaging with evidence of septic bursitis in addition to joint effusion concerning for septic arthritis.  Orthopedic surgery will evaluate patient today; per Dr. Posey Pronto, if no improvement from IV antibiotics, patient may require surgery.  - Orthopedic surgery consulted; appreciate their recommendations - Vancomycin and ceftriaxone as noted above - Blood cultures ordered

## 2022-03-15 NOTE — ED Triage Notes (Signed)
Pt comes with c/o right arm swelling. Pt states he hit his right elbow 2 weeks ago. Pt states he ahs gotten worse more each day. Pt states he used ice but swelling continued. Pt has obvious swelling, redness and warmth.

## 2022-03-15 NOTE — H&P (Signed)
History and Physical    [name] WUJ:[id number] DOB: Mar 07, 1980 DOA: 03/15/2022 DOS: the patient was seen and examined on 03/15/2022 PCP: Arlina Robes, MD  Patient coming from: Home  Chief Complaint:  Chief Complaint  Patient presents with   Arm Injury   HPI: Joseph [name] is a 42 y.o. male with medical history significant of hypertension, severe persistent asthma, who presents to the ED due to arm swelling.  Mr. Massman states that approximately 2 weeks ago, he bumped his elbow when moving things around his home.  He did not think much of it at the time, and denies any skin breakage.  Approximately 2-3 days later, he noticed that his forearm and hand are beginning to gradually swell.  Since then, the swelling has significantly worsened, in addition to redness and pain.  The pain radiates from his hand into his forearm and up midway through the bicep.  He is still able to move his hand and forearm.  He denies any fever, chills.  He endorses some shortness of breath, stating that his asthma has intermittently flared when his arm begins to hurt.  ED Course:  On arrival to the ED, patient was hypertensive at 152/107 with tachycardia up to 117.  He was afebrile at 98.  He was saturating at 96% on room air. Initial workup remarkable for WBC of 25.6, platelets of 478, lactic acid of 1.5.  Right hand and forearm x-rays were obtained that did not show any osseous deformities.  Right arm venous duplex was obtained that did not show any DVT.  CT of the arm was subsequently obtained that demonstrated severe cellulitis, fluid collection in the bursa consistent with septic bursitis, moderate-sized elbow effusion concerning for septic arthritis, in addition to inflammation of the distal triceps muscle concerning for fasciitis.  Patient started on vancomycin and ceftriaxone.  Orthopedic surgery has been consulted.  TRH consulted for admission.  Review of Systems: As mentioned in the history  of present illness. All other systems reviewed and are negative.  Past Medical History:  Diagnosis Date   Asthma    COPD (chronic obstructive pulmonary disease) ([place])    Eczema    Hypertension    No past surgical history on file. Social History:  reports that he has been smoking. He has never used smokeless tobacco. He reports current drug use. Drug: Marijuana. He reports that he does not drink alcohol.  Allergies  Allergen Reactions   Penicillins Shortness Of Breath   Pork-Derived Products Nausea Only   Banana Hives    Family History  Problem Relation Age of Onset   Hypertension Mother     Prior to Admission medications   Medication Sig Start Date End Date Taking? Authorizing Provider  albuterol (PROVENTIL HFA;VENTOLIN HFA) 108 (90 Base) MCG/ACT inhaler Inhale 2 puffs into the lungs every 6 (six) hours as needed for wheezing or shortness of breath.    [provider]  albuterol (PROVENTIL) (2.5 MG/3ML) 0.083% nebulizer solution Take 2.5 mg by nebulization every 6 (six) hours as needed for wheezing or shortness of breath.    [provider]  dupilumab (DUPIXENT) 300 MG/2ML prefilled syringe Inject 300 mg into the skin every 14 (fourteen) days.    [provider]  fluticasone-salmeterol (ADVAIR HFA) 230-21 MCG/ACT inhaler Inhale 2 puffs into the lungs every 12 (twelve) hours.    [provider]  hydrochlorothiazide (HYDRODIURIL) 25 MG tablet Take 25 mg by mouth daily. 09/24/20   [provider]  losartan (COZAAR)  100 MG tablet Take 1 tablet (100 mg total) by mouth daily. 09/03/21   Wouk, Wilfred Curtis, MD  montelukast (SINGULAIR) 10 MG tablet Take 10 mg by mouth at bedtime.    [provider]  omeprazole (PRILOSEC) 20 MG capsule Take 20 mg by mouth daily as needed (GERD symptoms).    [provider]  predniSONE (DELTASONE) 2.5 MG tablet Take 7.5 mg by mouth daily.    [provider]  tiZANidine (ZANAFLEX) 2 MG  tablet Take 2 mg by mouth 3 (three) times daily. Patient not taking: Reported on 08/21/2021    [provider]  tobramycin (TOBREX) 0.3 % ophthalmic solution Place 1-2 drops into the right eye in the morning, at noon, in the evening, and at bedtime. 08/27/21   [provider]   Physical Exam: Vitals:   03/15/22 1049 03/15/22 1200 03/15/22 1230 03/15/22 1455  BP: (!) 152/107 (!) 129/93 (!) 147/97   Pulse: (!) 117 (!) 101 (!) 101   Resp: 18 19 18    Temp: 98 F (36.7 C)   98 F (36.7 C)  TempSrc:    Oral  SpO2: 96% 94% 93%    Physical Exam Vitals and nursing note reviewed.  Constitutional:      General: He is not in acute distress.    Appearance: He is obese. He is not toxic-appearing.  HENT:     Head: Normocephalic and atraumatic.     Mouth/Throat:     Mouth: Mucous membranes are moist.     Pharynx: Oropharynx is clear.  Eyes:     Conjunctiva/sclera: Conjunctivae normal.     Pupils: Pupils are equal, round, and reactive to light.  Cardiovascular:     Rate and Rhythm: Regular rhythm. Tachycardia present.     Heart sounds: No murmur heard.    No gallop.  Pulmonary:     Effort: Pulmonary effort is normal. No respiratory distress.     Breath sounds: Wheezing (Diffuse expiratory wheezing throughout all lung) present. No rhonchi or rales.  Abdominal:     General: Bowel sounds are normal.     Palpations: Abdomen is soft.  Musculoskeletal:     Right upper arm: Edema present.     Right elbow: Swelling present.     Right forearm: Swelling present.     Right hand: Swelling present.     Comments:  Right forearm and hand with significant edema and erythema extending up to the mid tricep region.  Tenderness to palpation.  Right elbow is fluctuant on palpation.  No opening or purulent drainage noted.  Mildly increased warmth to touch.  Patient is still able to make a fist as well as flex and extend at the elbow although limited due to pain.  Bilateral lower extremity  edema, 1+ pitting up to the knees.  Neurological:     General: No focal deficit present.     Mental Status: He is alert and oriented to person, place, and time.     Comments:  No focal weakness  Psychiatric:        Mood and Affect: Mood normal.        Behavior: Behavior normal.    Data Reviewed: CBC with WBC of 25.6, hemoglobin of 14.0, MCV of 83, platelets of 470 BMP with sodium of 141, potassium 3.6, bicarb 30, glucose 100, BUN 24, creatinine of 1.01, calcium 8.5 and GFR above 60 Lactic acid negative x 2 at 1.5 and 0.9 ESR elevated at 27  CT FOREARM RIGHT  W CONTRAST  Result Date: 03/15/2022 CLINICAL DATA:  Pain and swelling. EXAM: CT OF THE UPPER RIGHT EXTREMITY WITH CONTRAST TECHNIQUE: Multidetector CT imaging of the upper right extremity was performed according to the standard protocol following intravenous contrast administration. RADIATION DOSE REDUCTION: This exam was performed according to the departmental dose-optimization program which includes automated exposure control, adjustment of the mA and/or kV according to patient size and/or use of iterative reconstruction technique. CONTRAST:  18mL OMNIPAQUE IOHEXOL 300 MG/ML  SOLN COMPARISON:  Radiographs, same date. FINDINGS: Severe diffuse subcutaneous soft tissue swelling/edema/fluid involving the entire imaged right upper extremity from just above the elbow joint down to the hand. Findings consistent with severe cellulitis. At the level of the elbow there is a rim enhancing fluid collection in the region of the olecranon bursa highly suspicious for septic bursitis. This fluid collection measures a maximum of 14 x 6.6 x 3.5 cm. There is also a moderate-sized elbow joint effusion. Could not exclude septic arthritis. No destructive bony changes to suggest osteomyelitis. Signal abnormality in the distal triceps muscle is suspicious for myofasciitis and could not exclude pyomyositis. Do not see any definite findings for myofasciitis or  pyomyositis involving the forearm musculature. The wrist joint is maintained. No findings suspicious for septic arthritis or osteomyelitis involving the wrist, hand or radius/ulna. The major vascular structures are patent. IMPRESSION: 1. Severe diffuse subcutaneous soft tissue swelling/edema/fluid involving the entire imaged right upper extremity from just above the elbow joint down to the hand. Findings consistent with severe cellulitis. 2. Rim enhancing fluid collection in the region of the olecranon bursa highly suspicious for septic bursitis. 3. Moderate-sized elbow joint effusion. Could not exclude septic arthritis. 4. Suspect inflammation/edema in the distal triceps muscle, suspicious for myofasciitis and could not exclude pyomyositis. 5. No findings suspicious for septic arthritis or osteomyelitis involving the wrist, hand or radius/ulna. Electronically Signed   By: Marijo Sanes M.D.   On: 03/15/2022 13:59   US Venous Img Upper Right (DVT Study)  Result Date: 03/15/2022 CLINICAL DATA:  Right upper extremity edema for the past 2 weeks. Evaluate for DVT. EXAM: RIGHT UPPER EXTREMITY VENOUS DOPPLER ULTRASOUND TECHNIQUE: Gray-scale sonography with graded compression, as well as color Doppler and duplex ultrasound were performed to evaluate the upper extremity deep venous system from the level of the subclavian vein and including the jugular, axillary, basilic, radial, ulnar and upper cephalic vein. Spectral Doppler was utilized to evaluate flow at rest and with distal augmentation maneuvers. COMPARISON:  None Available. FINDINGS: Contralateral Subclavian Vein: Respiratory phasicity is normal and symmetric with the symptomatic side. No evidence of thrombus. Normal compressibility. Internal Jugular Vein: No evidence of thrombus. Normal compressibility, respiratory phasicity and response to augmentation. Subclavian Vein: No evidence of thrombus. Normal compressibility, respiratory phasicity and response to  augmentation. Axillary Vein: No evidence of thrombus. Normal compressibility, respiratory phasicity and response to augmentation. Cephalic Vein: No evidence of thrombus. Normal compressibility, respiratory phasicity and response to augmentation. Basilic Vein: No evidence of thrombus. Normal compressibility, respiratory phasicity and response to augmentation. Brachial Veins: No evidence of thrombus. Normal compressibility, respiratory phasicity and response to augmentation. Radial Veins: No evidence of thrombus. Normal compressibility, respiratory phasicity and response to augmentation. Ulnar Veins: No evidence of thrombus. Normal compressibility, respiratory phasicity and response to augmentation. Other Findings: There is a minimal amount of subcutaneous edema at the level of the forearm. IMPRESSION: No evidence of DVT within the right upper extremity. Electronically Signed   By: Eldridge Abrahams.D.  On: 03/15/2022 12:36   DG Forearm Right  Result Date: 03/15/2022 CLINICAL DATA:  Right arm pain and swelling after injury. EXAM: RIGHT FOREARM - 2 VIEW COMPARISON:  None Available. FINDINGS: There is no evidence of fracture or other focal bone lesions. Soft tissues are unremarkable. IMPRESSION: Negative. Electronically Signed   By: Marijo Conception M.D.   On: 03/15/2022 11:43   DG Hand Complete Right  Result Date: 03/15/2022 CLINICAL DATA:  Right arm pain and swelling after injury. EXAM: RIGHT HAND - COMPLETE 3+ VIEW COMPARISON:  None Available. FINDINGS: There is no evidence of fracture or dislocation. There is no evidence of arthropathy or other focal bone abnormality. Soft tissues are unremarkable. IMPRESSION: Negative. Electronically Signed   By: Marijo Conception M.D.   On: 03/15/2022 11:41    There are no new results to review at this time.  Assessment and Plan: * Cellulitis of arm, right Patient is presenting with significant forearm and hand swelling in the setting of possible trauma approximately 2  weeks ago.  CT with evidence of severe cellulitis and likely myofasciitis and septic bursitis.  Given immunocompromise status and progressive nature of symptoms, patient will require parenteral therapy.  Given high risk for sepsis, will monitor on telemetry overnight.  - Vancomycin per pharmacy dosing - Continue Rocephin 2 g daily - Tylenol as needed for fever - Norco and Dilaudid for pain control - Monitor for signs  Septic bursitis of elbow, right CT imaging with evidence of septic bursitis in addition to joint effusion concerning for septic arthritis.  Orthopedic surgery will evaluate patient today; per Dr. Posey Pronto, if no improvement from IV antibiotics, patient may require surgery.  - Orthopedic surgery consulted; appreciate their recommendations - Vancomycin and ceftriaxone as noted above - Blood cultures ordered  Severe persistent asthma - Continue home bronchodilators, prednisone and Dupixent - Albuterol nebs as needed every 2 hours  Lymphedema of both lower extremities Patient's wife is concerned regarding patient's bilateral lower extremity edema for which workup has been negative including cardiac.  She states that the swelling goes down with compression stockings but returns immediately on removal compression stockings.  Most consistent with lymphedema versus chronic venous stasis.  - Continue compression stocking - Consider referral for lymphoscintigraphy  Essential hypertension - Continue home antihypertensives  Advance Care Planning:   Code Status: Full Code verified by patient  Consults: Orthopedic Surgery  Family Communication: Patient's wife updated at bedside  Severity of Illness: The appropriate patient status for this patient is INPATIENT. Inpatient status is judged to be reasonable and necessary in order to provide the required intensity of service to ensure the patient's safety. The patient's presenting symptoms, physical exam findings, and initial radiographic  and laboratory data in the context of their chronic comorbidities is felt to place them at high risk for further clinical deterioration. Furthermore, it is not anticipated that the patient will be medically stable for discharge from the hospital within 2 midnights of admission.   * I certify that at the point of admission it is my clinical judgment that the patient will require inpatient hospital care spanning beyond 2 midnights from the point of admission due to high intensity of service, high risk for further deterioration and high frequency of surveillance required.*  Author: Jose Persia, MD 03/15/2022 3:06 PM  For on call review [email or web address].

## 2022-03-15 NOTE — Consult Note (Signed)
PHARMACY -  BRIEF ANTIBIOTIC NOTE   Pharmacy has received consult(s) for Vancomycin from an ED provider.  The patient's profile has been reviewed for ht/wt/allergies/indication/available labs.    One time order(s) placed for Vancomycin 2g IV x 1 dose.  Further antibiotics/pharmacy consults should be ordered by admitting physician if indicated.                       Thank you, Pearla Dubonnet 03/15/2022  2:21 PM

## 2022-03-15 NOTE — ED Provider Notes (Signed)
[facility] Provider Note    Event Date/Time   First MD Initiated Contact with Patient 03/15/22 1112     (approximate)   History   Arm Injury   HPI  Joseph [name] is a 42 y.o. male with a past medical history of COPD, obstructive sleep apnea who presents today for evaluation of right arm injury.  Patient reports that he hit his elbow against something last week, and has had progressive pain and swelling throughout his forearm ever since.  He reports that his pain is worsened with any movement of his elbow or rotation of his wrist.  He denies fevers or chills.  He feels that the arm is gotten more swollen.  He denies history of diabetes.  Patient Active Problem List   Diagnosis Date Noted   Cellulitis of arm, right 03/15/2022   Septic bursitis of elbow, right 03/15/2022   Leukocytosis 08/29/2021   Lymphedema of both lower extremities 08/29/2021   OSA (obstructive sleep apnea) 08/23/2021   COPD with acute exacerbation ([place]) 08/21/2021   COPD exacerbation ([place]) 08/07/2021   Acute asthma exacerbation 12/10/2020   COPD with acute bronchitis ([place]) 12/10/2020   Chronic respiratory failure ([place]) 12/10/2020   Essential hypertension 12/10/2020   GERD (gastroesophageal reflux disease) 12/10/2020   Sepsis ([place]) 12/05/2017   Prediabetes 09/24/2014   Tobacco use 08/20/2013   Severe persistent asthma 12/04/2012          Physical Exam   Triage Vital Signs: ED Triage Vitals  Enc Vitals Group     BP 03/15/22 1049 (!) 152/107     Pulse Rate 03/15/22 1049 (!) 117     Resp 03/15/22 1049 18     Temp 03/15/22 1049 98 F (36.7 C)     Temp src --      SpO2 03/15/22 1049 96 %     Weight --      Height --      Head Circumference --      Peak Flow --      Pain Score 03/15/22 1045 10     Pain Loc --      Pain Edu? --      Excl. in [place]? --     Most recent vital signs: Vitals:   03/15/22 1529 03/15/22 1650  BP: (!) 142/102   Pulse: 97   Resp: 19    Temp: 98.1 F (36.7 C)   SpO2: 94% 95%    Physical Exam Vitals and nursing note reviewed.  Constitutional:      General: Awake and alert. No acute distress.    Appearance: Normal appearance. The patient is overweight.  HENT:     Head: Normocephalic and atraumatic.     Mouth: Mucous membranes are moist.  Eyes:     General: PERRL. Normal EOMs        Right eye: No discharge.        Left eye: No discharge.     Conjunctiva/sclera: Conjunctivae normal.  Cardiovascular:     Rate and Rhythm: Tachycardic rate and regular rhythm.     Pulses: Normal pulses.     Heart sounds: Normal heart sounds Pulmonary:     Effort: Pulmonary effort is normal. No respiratory distress.  Abdominal:     Abdomen is soft. There is no abdominal tenderness. No rebound or guarding. No distention. Musculoskeletal:        General: No swelling. Normal range of motion.     Cervical back: Normal range of  motion and neck supple.  Right upper extremity: Pitting edema throughout the forearm.  Compartments are soft and compressible.  There is erythema and warmth extending from distal upper arm to the distal forearm.  There is tenderness palpation with the maximal tenderness at the medial epicondyle.  Patient is able to actively and passively fully flex and extend at the elbow and the wrist.  No focal area of fluctuance.  He has pain with resisted supination and resisted pronation.  There is no lymphangitis.  No open wounds.  No drainage.  No crepitus.  Able to make a fist, give thumbs up, cross fingers, and make okay sign and hold closed against resistance.  Normal radial pulse Skin:    General: Skin is warm and dry.     Capillary Refill: Capillary refill takes less than 2 seconds.     Findings: No rash.  Neurological:     Mental Status: The patient is awake and alert.      ED Results / Procedures / Treatments   Labs (all labs ordered are listed, but only abnormal results are displayed) Labs Reviewed  CBC -  Abnormal; Notable for the following components:      Result Value   WBC 25.6 (*)    MCH 25.5 (*)    RDW 16.4 (*)    Platelets 478 (*)    All other components within normal limits  BASIC METABOLIC PANEL - Abnormal; Notable for the following components:   Glucose, Bld 100 (*)    BUN 24 (*)    Calcium 8.5 (*)    All other components within normal limits  SEDIMENTATION RATE - Abnormal; Notable for the following components:   Sed Rate 27 (*)    All other components within normal limits  LACTIC ACID, PLASMA  LACTIC ACID, PLASMA  CBC WITH DIFFERENTIAL/PLATELET  BASIC METABOLIC PANEL     EKG     RADIOLOGY I independently reviewed and interpreted imaging and agree with radiologists findings.     PROCEDURES:  Critical Care performed:   Procedures   MEDICATIONS ORDERED IN ED: Medications  cefTRIAXone (ROCEPHIN) 2 g in sodium chloride 0.9 % 100 mL IVPB (0 g Intravenous Stopped 03/15/22 1445)  vancomycin (VANCOREADY) IVPB 2000 mg/400 mL (2,000 mg Intravenous New Bag/Given 03/15/22 1710)  enoxaparin (LOVENOX) injection 40 mg (40 mg Subcutaneous Patient Refused/Not Given 03/15/22 1642)  sodium chloride flush (NS) 0.9 % injection 3 mL (has no administration in time range)  acetaminophen (TYLENOL) tablet 650 mg (has no administration in time range)    Or  acetaminophen (TYLENOL) suppository 650 mg (has no administration in time range)  HYDROcodone-acetaminophen (NORCO/VICODIN) 5-325 MG per tablet 1-2 tablet (has no administration in time range)  HYDROmorphone (DILAUDID) injection 1 mg (1 mg Intravenous Given 03/15/22 1641)  polyethylene glycol (MIRALAX / GLYCOLAX) packet 17 g (has no administration in time range)  ondansetron (ZOFRAN) tablet 4 mg (has no administration in time range)    Or  ondansetron (ZOFRAN) injection 4 mg (has no administration in time range)  albuterol (PROVENTIL) (2.5 MG/3ML) 0.083% nebulizer solution 2.5 mg (2.5 mg Nebulization Given 03/15/22 1650)   mometasone-formoterol (DULERA) 200-5 MCG/ACT inhaler 2 puff (has no administration in time range)  hydrochlorothiazide (HYDRODIURIL) tablet 25 mg (has no administration in time range)  losartan (COZAAR) tablet 100 mg (has no administration in time range)  montelukast (SINGULAIR) tablet 10 mg (has no administration in time range)  predniSONE (DELTASONE) tablet 7.5 mg (has no administration in time range)  tiZANidine (ZANAFLEX) tablet 2 mg (has no administration in time range)  ipratropium-albuterol (DUONEB) 0.5-2.5 (3) MG/3ML nebulizer solution 3 mL (has no administration in time range)  sodium chloride 0.9 % bolus 1,000 mL (0 mLs Intravenous Stopped 03/15/22 1300)  iohexol (OMNIPAQUE) 300 MG/ML solution 100 mL (100 mLs Intravenous Contrast Given 03/15/22 1325)     IMPRESSION / MDM / ASSESSMENT AND PLAN / ED COURSE  I reviewed the triage vital signs and the nursing notes.   Differential diagnosis includes, but is not limited to, bursitis, myositis, abscess, cellulitis.  Patient is awake and alert, tachycardic to 117 on arrival, though normotensive.  He was placed on the cardiac monitor and started on IV fluids.  He has marked swelling and erythema to his forearm without open wounds.  Labs obtained in triage are remarkable for leukocytosis to 25.  Upon chart review, patient normally has an elevated white blood cell count, which she attributes to being on chronic prednisone.  Patient has a normal lactate.  X-rays obtained demonstrate no acute bony injury.  CT scan with contrast was obtained for evaluation of possible abscess or myositis.  Duplex ultrasound also obtained given the extent of the pitting edema, this was negative for clot.  CT scan reveals findings suspicious for severe cellulitis with possible component of myositis/pyomyositis and septic bursitis.  His compartments are soft and compressible throughout, do not suspect compartment syndrome.  He has normal distal pulses.  Sensation intact  distally.  There is no pain out of proportion.  Patient is technically immunocompromise given his daily prednisone.  He was started on vancomycin and Rocephin.  There is no crepitus or pain out of proportion, no hemodynamic instability, I do not suspect necrotizing infection.  There is no gas noted on CT.  These findings were discussed with Dr. Posey Pronto with orthopedic surgery who recommends broad-spectrum antibiotics, admission to the hospitalist service, and elevation of the extremity.  Patient is in agreement with this plan.  I discussed with the hospitalist, Dr. Charleen Kirks who agrees to admit the patient.   Patient's presentation is most consistent with acute presentation with potential threat to life or bodily function.   Clinical Course as of 03/15/22 1714  Wed Mar 15, 2022  1406 Dr. Posey Pronto with orthopedics paged [JP]  1410 Dr. Posey Pronto will come see the patient. Recommends elevation, IVF, and admission [JP]    Clinical Course User Index [JP] Ysabela Keisler, Clarnce Flock, [name]     FINAL CLINICAL IMPRESSION(S) / ED DIAGNOSES   Final diagnoses:  Olecranon bursitis of right elbow  Infective myositis of right forearm  Cellulitis of right upper extremity     Rx / DC Orders   ED Discharge Orders     None        Note:  This document was prepared using Dragon voice recognition software and may include unintentional dictation errors.   Marquette Old, [name] 03/15/22 1714    Vanessa Crittenden, MD 03/16/22 [phone]

## 2022-03-16 ENCOUNTER — Inpatient Hospital Stay: Payer: Medicare Other | Admitting: Anesthesiology

## 2022-03-16 ENCOUNTER — Encounter: Payer: Self-pay | Admitting: Internal Medicine

## 2022-03-16 ENCOUNTER — Other Ambulatory Visit: Payer: Self-pay

## 2022-03-16 ENCOUNTER — Encounter
Admission: EM | Disposition: A | Discharge status: Home / Self Care | Payer: Self-pay | Source: Home / Self Care | Attending: Internal Medicine

## 2022-03-16 DIAGNOSIS — I10 Essential (primary) hypertension: Secondary | ICD-10-CM | POA: Diagnosis not present

## 2022-03-16 DIAGNOSIS — J4550 Severe persistent asthma, uncomplicated: Secondary | ICD-10-CM | POA: Diagnosis not present

## 2022-03-16 DIAGNOSIS — M71121 Other infective bursitis, right elbow: Secondary | ICD-10-CM | POA: Diagnosis not present

## 2022-03-16 DIAGNOSIS — L03113 Cellulitis of right upper limb: Secondary | ICD-10-CM | POA: Diagnosis not present

## 2022-03-16 HISTORY — PX: OLECRANON BURSECTOMY: SHX2097

## 2022-03-16 LAB — BASIC METABOLIC PANEL
Anion gap: 7 (ref 5–15)
BUN: 21 mg/dL — ABNORMAL HIGH (ref 6–20)
CO2: 29 mmol/L (ref 22–32)
Calcium: 8 mg/dL — ABNORMAL LOW (ref 8.9–10.3)
Chloride: 105 mmol/L (ref 98–111)
Creatinine, Ser: 0.89 mg/dL (ref 0.61–1.24)
GFR, Estimated: >60 mL/min (ref >60)
Glucose, Bld: 82 mg/dL (ref 70–99)
Potassium: 3.6 mmol/L (ref 3.5–5.1)
Sodium: 141 mmol/L (ref 135–145)

## 2022-03-16 LAB — CBC WITH DIFFERENTIAL/PLATELET
Abs Immature Granulocytes: 0.59 10*3/uL — ABNORMAL HIGH (ref 0.00–0.07)
Basophils Absolute: 0.1 10*3/uL (ref 0.0–0.1)
Basophils Relative: 0 %
Eosinophils Absolute: 0.2 10*3/uL (ref 0.0–0.5)
Eosinophils Relative: 1 %
HCT: 44.9 % (ref 39.0–52.0)
Hemoglobin: 13.6 g/dL (ref 13.0–17.0)
Immature Granulocytes: 2 %
Lymphocytes Relative: 9 %
Lymphs Abs: 2.3 10*3/uL (ref 0.7–4.0)
MCH: 25.7 pg — ABNORMAL LOW (ref 26.0–34.0)
MCHC: 30.3 g/dL (ref 30.0–36.0)
MCV: 84.9 fL (ref 80.0–100.0)
Monocytes Absolute: 2.3 10*3/uL — ABNORMAL HIGH (ref 0.1–1.0)
Monocytes Relative: 9 %
Neutro Abs: 19.7 10*3/uL — ABNORMAL HIGH (ref 1.7–7.7)
Neutrophils Relative %: 79 %
Platelets: 458 10*3/uL — ABNORMAL HIGH (ref 150–400)
RBC: 5.29 MIL/uL (ref 4.22–5.81)
RDW: 16.6 % — ABNORMAL HIGH (ref 11.5–15.5)
Smear Review: NORMAL
WBC: 25.2 10*3/uL — ABNORMAL HIGH (ref 4.0–10.5)
nRBC: 0 % (ref 0.0–0.2)

## 2022-03-16 SURGERY — BURSECTOMY, ELBOW
Anesthesia: General | Site: Elbow | Laterality: Right

## 2022-03-16 MED ORDER — IPRATROPIUM-ALBUTEROL 0.5-2.5 (3) MG/3ML IN SOLN: 3.0000 mL | Freq: Once | RESPIRATORY_TRACT | Status: AC

## 2022-03-16 MED ORDER — MIDAZOLAM HCL 2 MG/2ML IJ SOLN
INTRAMUSCULAR | Status: AC
  Filled 2022-03-16: qty 2

## 2022-03-16 MED ORDER — KETOROLAC TROMETHAMINE 15 MG/ML IJ SOLN
15.0000 mg | Freq: Three times a day (TID) | INTRAMUSCULAR | Status: AC
  Administered 2022-03-16 – 2022-03-17 (×3): 15 mg via INTRAVENOUS
  Filled 2022-03-16 (×3): qty 1

## 2022-03-16 MED ORDER — HYDROMORPHONE HCL 1 MG/ML IJ SOLN
0.5000 mg | INTRAMUSCULAR | Status: DC | PRN
  Administered 2022-03-16 – 2022-03-18 (×6): 0.5 mg via INTRAVENOUS
  Filled 2022-03-16 (×6): qty 1

## 2022-03-16 MED ORDER — LIDOCAINE HCL (CARDIAC) PF 100 MG/5ML IV SOSY
PREFILLED_SYRINGE | INTRAVENOUS | Status: DC | PRN
  Administered 2022-03-16: 100 mg via INTRAVENOUS

## 2022-03-16 MED ORDER — NALOXONE HCL 0.4 MG/ML IJ SOLN
INTRAMUSCULAR | Status: DC | PRN
  Administered 2022-03-16: 40 ug via INTRAVENOUS

## 2022-03-16 MED ORDER — IBUPROFEN 400 MG PO TABS
400.0000 mg | ORAL_TABLET | Freq: Four times a day (QID) | ORAL | Status: DC
  Administered 2022-03-16: 400 mg via ORAL
  Filled 2022-03-16: qty 1

## 2022-03-16 MED ORDER — VANCOMYCIN HCL 1250 MG/250ML IV SOLN
1250.0000 mg | Freq: Two times a day (BID) | INTRAVENOUS | Status: DC
  Administered 2022-03-16 – 2022-03-18 (×5): 1250 mg via INTRAVENOUS
  Filled 2022-03-16 (×5): qty 250

## 2022-03-16 MED ORDER — IPRATROPIUM-ALBUTEROL 0.5-2.5 (3) MG/3ML IN SOLN
RESPIRATORY_TRACT | Status: AC
  Administered 2022-03-16: 3 mL via RESPIRATORY_TRACT
  Filled 2022-03-16: qty 3

## 2022-03-16 MED ORDER — LACTATED RINGERS IV SOLN: INTRAVENOUS | Status: DC | PRN

## 2022-03-16 MED ORDER — FENTANYL CITRATE (PF) 100 MCG/2ML IJ SOLN
INTRAMUSCULAR | Status: DC | PRN
  Administered 2022-03-16 (×2): 50 ug via INTRAVENOUS

## 2022-03-16 MED ORDER — ONDANSETRON HCL 4 MG/2ML IJ SOLN
INTRAMUSCULAR | Status: DC | PRN
  Administered 2022-03-16: 4 mg via INTRAVENOUS

## 2022-03-16 MED ORDER — SUGAMMADEX SODIUM 200 MG/2ML IV SOLN
INTRAVENOUS | Status: DC | PRN
  Administered 2022-03-16: 332.8 mg via INTRAVENOUS

## 2022-03-16 MED ORDER — ROCURONIUM BROMIDE 100 MG/10ML IV SOLN
INTRAVENOUS | Status: DC | PRN
  Administered 2022-03-16: 50 mg via INTRAVENOUS

## 2022-03-16 MED ORDER — FENTANYL CITRATE (PF) 100 MCG/2ML IJ SOLN
INTRAMUSCULAR | Status: AC
  Filled 2022-03-16: qty 2

## 2022-03-16 MED ORDER — SODIUM CHLORIDE 0.9 % IV SOLN: INTRAVENOUS | Status: DC | PRN

## 2022-03-16 MED ORDER — METOPROLOL TARTRATE 5 MG/5ML IV SOLN
INTRAVENOUS | Status: DC | PRN
  Administered 2022-03-16: 2 mg via INTRAVENOUS

## 2022-03-16 MED ORDER — MIDAZOLAM HCL 2 MG/2ML IJ SOLN
INTRAMUSCULAR | Status: DC | PRN
  Administered 2022-03-16: 2 mg via INTRAVENOUS

## 2022-03-16 MED ORDER — ASPIRIN 325 MG PO TABS
325.0000 mg | ORAL_TABLET | Freq: Every day | ORAL | Status: DC
  Administered 2022-03-17 – 2022-03-18 (×2): 325 mg via ORAL
  Filled 2022-03-16 (×3): qty 1

## 2022-03-16 MED ORDER — 0.9 % SODIUM CHLORIDE (POUR BTL) OPTIME
TOPICAL | Status: DC | PRN
  Administered 2022-03-16: 1000 mL

## 2022-03-16 MED ORDER — FENTANYL CITRATE (PF) 100 MCG/2ML IJ SOLN: 25.0000 ug | INTRAMUSCULAR | Status: DC | PRN

## 2022-03-16 MED ORDER — HYDROMORPHONE HCL 1 MG/ML IJ SOLN
2.0000 mg | INTRAMUSCULAR | Status: DC | PRN
  Filled 2022-03-16: qty 2

## 2022-03-16 MED ORDER — PROMETHAZINE HCL 25 MG/ML IJ SOLN: 6.2500 mg | INTRAMUSCULAR | Status: DC | PRN

## 2022-03-16 MED ORDER — DEXMEDETOMIDINE HCL IN NACL 200 MCG/50ML IV SOLN
INTRAVENOUS | Status: DC | PRN
  Administered 2022-03-16: 8 ug via INTRAVENOUS

## 2022-03-16 MED ORDER — DEXAMETHASONE SODIUM PHOSPHATE 10 MG/ML IJ SOLN
INTRAMUSCULAR | Status: DC | PRN
  Administered 2022-03-16: 10 mg via INTRAVENOUS

## 2022-03-16 MED ORDER — PROPOFOL 10 MG/ML IV BOLUS
INTRAVENOUS | Status: DC | PRN
  Administered 2022-03-16: 180 mg via INTRAVENOUS

## 2022-03-16 MED ORDER — ALBUTEROL SULFATE HFA 108 (90 BASE) MCG/ACT IN AERS
INHALATION_SPRAY | RESPIRATORY_TRACT | Status: DC | PRN
  Administered 2022-03-16: 4 via RESPIRATORY_TRACT

## 2022-03-16 SURGICAL SUPPLY — 43 items
BNDG ELASTIC 4X5.8 VLCR NS LF (GAUZE/BANDAGES/DRESSINGS) ×1 IMPLANT
CHLORAPREP W/TINT 26 (MISCELLANEOUS) ×1 IMPLANT
CUFF TOURN SGL QUICK 18X4 (TOURNIQUET CUFF) ×1 IMPLANT
CUFF TOURN SGL QUICK 24 (TOURNIQUET CUFF)
CUFF TRNQT CYL 24X4X16.5-23 (TOURNIQUET CUFF) ×1 IMPLANT
DRAPE 3/4 80X56 (DRAPES) ×1 IMPLANT
ELECT CAUTERY BLADE 6.4 (BLADE) ×1 IMPLANT
ELECT REM PT RETURN 9FT ADLT (ELECTROSURGICAL) ×1
ELECTRODE REM PT RTRN 9FT ADLT (ELECTROSURGICAL) ×1 IMPLANT
GAUZE XEROFORM 1X8 LF (GAUZE/BANDAGES/DRESSINGS) ×1 IMPLANT
GLOVE BIOGEL PI IND STRL 8 (GLOVE) ×1 IMPLANT
GLOVE SURG SYN 7.5  E (GLOVE) ×1
GLOVE SURG SYN 7.5 E (GLOVE) ×1 IMPLANT
GLOVE SURG SYN 7.5 PF PI (GLOVE) ×1 IMPLANT
GOWN STRL REUS W/ TWL LRG LVL3 (GOWN DISPOSABLE) ×1 IMPLANT
GOWN STRL REUS W/ TWL XL LVL3 (GOWN DISPOSABLE) ×1 IMPLANT
GOWN STRL REUS W/TWL LRG LVL3 (GOWN DISPOSABLE) ×1
GOWN STRL REUS W/TWL XL LVL3 (GOWN DISPOSABLE) ×1
KIT TURNOVER KIT A (KITS) ×1 IMPLANT
MANIFOLD NEPTUNE II (INSTRUMENTS) ×1 IMPLANT
NDL FILTER BLUNT 18X1 1/2 (NEEDLE) ×1 IMPLANT
NEEDLE FILTER BLUNT 18X1 1/2 (NEEDLE) IMPLANT
NS IRRIG 1000ML POUR BTL (IV SOLUTION) IMPLANT
NS IRRIG 500ML POUR BTL (IV SOLUTION) ×1 IMPLANT
PACK EXTREMITY ARMC (MISCELLANEOUS) ×1 IMPLANT
PAD ABD DERMACEA PRESS 5X9 (GAUZE/BANDAGES/DRESSINGS) ×1 IMPLANT
PAD CAST 4YDX4 CTTN HI CHSV (CAST SUPPLIES) ×3 IMPLANT
PAD PREP 24X41 OB/GYN DISP (PERSONAL CARE ITEMS) ×1 IMPLANT
PADDING CAST COTTON 4X4 STRL (CAST SUPPLIES) ×3
SLING ARM LRG DEEP (SOFTGOODS) IMPLANT
SPLINT CAST 1 STEP 5X30 WHT (MISCELLANEOUS) IMPLANT
SPONGE T-LAP 18X18 ~~LOC~~+RFID (SPONGE) ×1 IMPLANT
STAPLER SKIN PROX 35W (STAPLE) ×1 IMPLANT
STOCKINETTE STRL 6IN 960660 (GAUZE/BANDAGES/DRESSINGS) IMPLANT
SUT ETHILON 3-0 FS-10 30 BLK (SUTURE) ×1
SUT PDS 2-0 27IN (SUTURE) IMPLANT
SUT VIC AB 0 CT2 27 (SUTURE) ×1 IMPLANT
SUT VIC AB 2-0 CT2 27 (SUTURE) ×1 IMPLANT
SUTURE EHLN 3-0 FS-10 30 BLK (SUTURE) ×1 IMPLANT
SWAB CULTURE AMIES ANAERIB BLU (MISCELLANEOUS) IMPLANT
SYR 5ML LL (SYRINGE) ×1 IMPLANT
TRAP FLUID SMOKE EVACUATOR (MISCELLANEOUS) ×1 IMPLANT
WATER STERILE IRR 500ML POUR (IV SOLUTION) ×1 IMPLANT

## 2022-03-16 NOTE — Progress Notes (Signed)
Patient arrived from PACU via bed in stable condition. Pt drowsy, but alert and oriented. No complaints of pain at this time. Wife at bedside

## 2022-03-16 NOTE — Consult Note (Addendum)
Pharmacy Antibiotic Note  Joseph [name] is a 42 y.o. male with medical history including HTN, asthma on prednisone and Dupixent admitted on 03/15/2022 with  right arm cellulitis complicated by infected / septic olecranon bursitis . Orthopaedics is following. Pharmacy has been consulted for vancomycin dosing. Patient is also ordered ceftriaxone.  Plan:  Patient received vancomycin 2 g IV loading dose 1/31 at 1710  Start vancomycin 1.25 g IV q12h --Calculated AUC: 511, Cmin 14.2 --Daily Scr per protocol --Levels at steady state or as clinically indicated  Height: 5\' 5"  (165.1 cm) Weight: 83.2 kg (183 lb 6.8 oz) IBW/kg (Calculated) : 61.5  Temp (24hrs), Avg:98 F (36.7 C), Min:97.6 F (36.4 C), Max:98.3 F (36.8 C)  Recent Labs  Lab 03/15/22 1042 03/15/22 1250 03/16/22 0320  WBC 25.6*  --  25.2*  CREATININE 1.01  --  0.89  LATICACIDVEN 1.5 0.9  --     Estimated Creatinine Clearance: 106.3 mL/min (by C-G formula based on SCr of 0.89 mg/dL).    Allergies  Allergen Reactions   Penicillins Shortness Of Breath   Pork-Derived Products Nausea Only   Banana Hives   Coconut (Cocos Nucifera) Hives    Antimicrobials this admission: Ceftriaxone 1/31 >>  Vancomycin 1/31 >>   Dose adjustments this admission: N/A  Microbiology results: N/A  Thank you for allowing pharmacy to be a part of this patient's care.  Benita Gutter 03/16/2022 9:02 AM

## 2022-03-16 NOTE — Progress Notes (Signed)
Patient to OR for I&D of RUE in stable condition.

## 2022-03-16 NOTE — Progress Notes (Addendum)
[facility] [place] NAME: Joseph [name]    MR#:  [id number]  DATE OF BIRTH:  1980/10/10  SUBJECTIVE:  patient complains of right arm swelling and tightness. No fever. IV NPO pain meds helping. Discussed about ibuprofen for next few days. Patient agreeable. No family at bedside. Scheduled for surgical procedure on 2 o'clock this afternoon by orthopedic.    VITALS:  Blood pressure (!) 140/92, pulse (!) 106, temperature 98.1 F (36.7 C), temperature source Oral, resp. rate 19, height 5\' 5"  (1.651 m), weight 83.2 kg, SpO2 92 %.  PHYSICAL EXAMINATION:   GENERAL:  42 y.o.-year-old patient with no acute distress. Obesity LUNGS: Normal breath sounds bilaterally, no wheezing CARDIOVASCULAR: S1, S2 normal. No murmur   ABDOMEN: Soft, nontender, nondistended. Bowel sounds present.  EXTREMITIES:    NEUROLOGIC: nonfocal  patient is alert and awake SKIN: as above  LABORATORY PANEL:  CBC Recent Labs  Lab 03/16/22 0320  WBC 25.2*  HGB 13.6  HCT 44.9  PLT 458*    Chemistries  Recent Labs  Lab 03/16/22 0320  NA 141  K 3.6  CL 105  CO2 29  GLUCOSE 82  BUN 21*  CREATININE 0.89  CALCIUM 8.0*   Cardiac Enzymes No results for input(s): "TROPONINI" in the last 168 hours. RADIOLOGY:  CT FOREARM RIGHT W CONTRAST  Result Date: 03/15/2022 CLINICAL DATA:  Pain and swelling. EXAM: CT OF THE UPPER RIGHT EXTREMITY WITH CONTRAST TECHNIQUE: Multidetector CT imaging of the upper right extremity was performed according to the standard protocol following intravenous contrast administration. RADIATION DOSE REDUCTION: This exam was performed according to the departmental dose-optimization program which includes automated exposure control, adjustment of the mA and/or kV according to patient size and/or use of iterative reconstruction technique. CONTRAST:  158mL OMNIPAQUE IOHEXOL 300 MG/ML  SOLN COMPARISON:  Radiographs, same date. FINDINGS: Severe diffuse  subcutaneous soft tissue swelling/edema/fluid involving the entire imaged right upper extremity from just above the elbow joint down to the hand. Findings consistent with severe cellulitis. At the level of the elbow there is a rim enhancing fluid collection in the region of the olecranon bursa highly suspicious for septic bursitis. This fluid collection measures a maximum of 14 x 6.6 x 3.5 cm. There is also a moderate-sized elbow joint effusion. Could not exclude septic arthritis. No destructive bony changes to suggest osteomyelitis. Signal abnormality in the distal triceps muscle is suspicious for myofasciitis and could not exclude pyomyositis. Do not see any definite findings for myofasciitis or pyomyositis involving the forearm musculature. The wrist joint is maintained. No findings suspicious for septic arthritis or osteomyelitis involving the wrist, hand or radius/ulna. The major vascular structures are patent. IMPRESSION: 1. Severe diffuse subcutaneous soft tissue swelling/edema/fluid involving the entire imaged right upper extremity from just above the elbow joint down to the hand. Findings consistent with severe cellulitis. 2. Rim enhancing fluid collection in the region of the olecranon bursa highly suspicious for septic bursitis. 3. Moderate-sized elbow joint effusion. Could not exclude septic arthritis. 4. Suspect inflammation/edema in the distal triceps muscle, suspicious for myofasciitis and could not exclude pyomyositis. 5. No findings suspicious for septic arthritis or osteomyelitis involving the wrist, hand or radius/ulna. Electronically Signed   By: Marijo Sanes M.D.   On: 03/15/2022 13:59   US Venous Img Upper Right (DVT Study)  Result Date: 03/15/2022 CLINICAL DATA:  Right upper extremity edema for the past 2 weeks. Evaluate for DVT. EXAM: RIGHT UPPER EXTREMITY VENOUS  DOPPLER ULTRASOUND TECHNIQUE: Gray-scale sonography with graded compression, as well as color Doppler and duplex ultrasound  were performed to evaluate the upper extremity deep venous system from the level of the subclavian vein and including the jugular, axillary, basilic, radial, ulnar and upper cephalic vein. Spectral Doppler was utilized to evaluate flow at rest and with distal augmentation maneuvers. COMPARISON:  None Available. FINDINGS: Contralateral Subclavian Vein: Respiratory phasicity is normal and symmetric with the symptomatic side. No evidence of thrombus. Normal compressibility. Internal Jugular Vein: No evidence of thrombus. Normal compressibility, respiratory phasicity and response to augmentation. Subclavian Vein: No evidence of thrombus. Normal compressibility, respiratory phasicity and response to augmentation. Axillary Vein: No evidence of thrombus. Normal compressibility, respiratory phasicity and response to augmentation. Cephalic Vein: No evidence of thrombus. Normal compressibility, respiratory phasicity and response to augmentation. Basilic Vein: No evidence of thrombus. Normal compressibility, respiratory phasicity and response to augmentation. Brachial Veins: No evidence of thrombus. Normal compressibility, respiratory phasicity and response to augmentation. Radial Veins: No evidence of thrombus. Normal compressibility, respiratory phasicity and response to augmentation. Ulnar Veins: No evidence of thrombus. Normal compressibility, respiratory phasicity and response to augmentation. Other Findings: There is a minimal amount of subcutaneous edema at the level of the forearm. IMPRESSION: No evidence of DVT within the right upper extremity. Electronically Signed   By: Sandi Mariscal M.D.   On: 03/15/2022 12:36   DG Forearm Right  Result Date: 03/15/2022 CLINICAL DATA:  Right arm pain and swelling after injury. EXAM: RIGHT FOREARM - 2 VIEW COMPARISON:  None Available. FINDINGS: There is no evidence of fracture or other focal bone lesions. Soft tissues are unremarkable. IMPRESSION: Negative. Electronically Signed    By: Marijo Conception M.D.   On: 03/15/2022 11:43   DG Hand Complete Right  Result Date: 03/15/2022 CLINICAL DATA:  Right arm pain and swelling after injury. EXAM: RIGHT HAND - COMPLETE 3+ VIEW COMPARISON:  None Available. FINDINGS: There is no evidence of fracture or dislocation. There is no evidence of arthropathy or other focal bone abnormality. Soft tissues are unremarkable. IMPRESSION: Negative. Electronically Signed   By: Marijo Conception M.D.   On: 03/15/2022 11:41    Assessment and Plan  Joseph [name] is a 42 y.o. male with medical history significant of hypertension, severe persistent asthma, who presents to the ED due to arm swelling.  pt states that approximately 2 weeks ago, he bumped his elbow when moving things around his home.  He did not think much of it at the time, and denies any skin breakage.  Approximately 2-3 days later, he noticed that his forearm and hand are beginning to gradually swell.   Right arm venous duplex was obtained that did not show any DVT.   CT of the forearm  demonstrated severe cellulitis, rim enhancing fluid collection in the region of the olecranon bursa highly suspicious for septic bursitis. This fluid collection measures a maximum of 14 x 6.6 x 3.5cm. There is also a moderate-sized elbow joint effusion.in addition to inflammation of the distal triceps muscle concerning for fasciitis.    Cellulitis of arm, right Septic bursitis of elbow, right Patient is presenting with significant forearm and hand swelling in the setting of possible trauma approximately 2 weeks ago.  elevated WBC, tachycardia and abnormal CT  - Vancomycin and Rocephin  - Tylenol as needed for fever - Norco and Dilaudid for pain control - Monitor for signs  - Orthopedic surgery consult with Dr Posey Pronto appreciate their recommendations - Blood  cultures was not ordered in the ER--too late already got IV abxs! --will await Surgical deep wound culture --wbc 25K-- --LA 1.5, 0.9   Severe  persistent asthma Eczema - Continue home bronchodilators, prednisone and Dupixent - Albuterol nebs as needed every 2 hours   Lymphedema of both lower extremities Patient's wife is concerned regarding patient's bilateral lower extremity edema for which workup has been negative including cardiac.  She states that the swelling goes down with compression stockings but returns immediately on removal compression stockings.  Most consistent with lymphedema versus chronic venous stasis.  - Continue compression stocking - Consider referral for lymphoscintigraphy as outpt   Essential hypertension - Continue home antihypertensives    Procedures: Family communication :none today Consults :Orthopedic CODE STATUS: Full DVT Prophylaxis :Ambulation Level of care: Telemetry Medical Status is: Inpatient Remains inpatient appropriate because: I and D of olecranon bursitis today and IV abxs Anticipate d/c 1-3 days    TOTAL TIME TAKING CARE OF THIS PATIENT: 35 minutes.  >50% time spent on counselling and coordination of care  Note: This dictation was prepared with Dragon dictation along with smaller phrase technology. Any transcriptional errors that result from this process are unintentional.  Fritzi Mandes M.D    [facility]   CC: Primary care physician; Arlina Robes, MD

## 2022-03-16 NOTE — Progress Notes (Signed)
Patient being verbally aggressive and disrespectful to assigned bedside nurse, Kennyth Lose and charge nurse Denice Paradise. Patient care taken over by Desia Saban, RN.

## 2022-03-16 NOTE — Anesthesia Preprocedure Evaluation (Addendum)
Anesthesia Evaluation  Patient identified by MRN, date of birth, ID band Patient awake    Reviewed: Allergy & Precautions, H&P , NPO status , Patient's Chart, lab work & pertinent test results, reviewed documented beta blocker date and time   Airway Mallampati: IV  TM Distance: >3 FB Neck ROM: full    Dental  (+) Dental Advidsory Given, Missing, Poor Dentition   Pulmonary neg shortness of breath, asthma , sleep apnea , COPD,  COPD inhaler, neg recent URI, Current Smoker and Patient abstained from smoking.   Pulmonary exam normal breath sounds clear to auscultation       Cardiovascular Exercise Tolerance: Good hypertension, (-) angina (-) Past MI and (-) Cardiac Stents Normal cardiovascular exam(-) dysrhythmias (-) Valvular Problems/Murmurs Rhythm:regular Rate:Normal     Neuro/Psych negative neurological ROS  negative psych ROS   GI/Hepatic Neg liver ROS,GERD  ,,  Endo/Other  negative endocrine ROS    Renal/GU negative Renal ROS  negative genitourinary   Musculoskeletal   Abdominal   Peds  Hematology negative hematology ROS (+)   Anesthesia Other Findings Past Medical History: No date: Asthma No date: COPD (chronic obstructive pulmonary disease) (HCC) No date: Eczema No date: Hypertension   Reproductive/Obstetrics negative OB ROS                             Anesthesia Physical Anesthesia Plan  ASA: 3  Anesthesia Plan: General   Post-op Pain Management:    Induction: Intravenous  PONV Risk Score and Plan: 1 and Ondansetron, Dexamethasone, Midazolam and Treatment may vary due to age or medical condition  Airway Management Planned: Oral ETT  Additional Equipment:   Intra-op Plan:   Post-operative Plan: Extubation in OR  Informed Consent: I have reviewed the patients History and Physical, chart, labs and discussed the procedure including the risks, benefits and alternatives for  the proposed anesthesia with the patient or authorized representative who has indicated his/her understanding and acceptance.     Dental Advisory Given  Plan Discussed with: Anesthesiologist, CRNA and Surgeon  Anesthesia Plan Comments:         Anesthesia Quick Evaluation

## 2022-03-16 NOTE — H&P (Signed)
H&P reviewed. No significant changes noted.  

## 2022-03-16 NOTE — Progress Notes (Signed)
  Transition of Care [facility]) Screening Note   Patient Details  Name: Joseph [name] Date of Birth: 23-Oct-1980   Transition of Care [facility]) CM/SW Contact:    Quin Hoop, LCSW Phone Number: 03/16/2022, 8:51 AM    Transition of Care Department [facility]) has reviewed patient and no TOC needs have been identified at this time. We will continue to monitor patient advancement through interdisciplinary progression rounds. If new patient transition needs arise, please place a TOC consult.

## 2022-03-16 NOTE — Progress Notes (Signed)
Patient re-examined this afternoon. He states he has some mild improvement in hand swelling, but still painful about elbow. RoM still painful at end-ranges.   Exam: Significant erythema about olecranon bursa and posterior forearm. +tenderness in this region. Palpable fluid collection about olecranon. Swelling in RUE mildly improved from prior exam. Elbow ROM: 10-100 degrees elbow flexion/extension actively and passively. Pain at end ranges of motion. 70/70 supination/pronation actively without significant pain   A/P: 42 yo M w/likely infected/septic olecranon bursitis.  The elbow joint itself does not appear to be infected given his current range of motion. 1.  Given lack of substantial improvement with IV Abx, we agreed to proceed with surgical intervention after discussion of risks, benefits, and alternatives. Plan for I&D of L olecranon bursa later today.   2.  N.p.o. until OR.

## 2022-03-16 NOTE — Op Note (Signed)
DATE OF SURGERY: 03/16/2022   PRE-OP DIAGNOSIS:  1. Right septic olecranon bursitis   POST-OP DIAGNOSIS:  1. Right septic olecranon bursitis   PROCEDURES:  1. Right olecranon bursectomy 2. Right elbow debridement including deep fascia and subcutaneous tissue   SURGEON: Cato Mulligan, MD   ANESTHESIA: gen   TOTAL IV FLUIDS: see anesthesia record   ESTIMATED BLOOD LOSS: 50cc   TOURNIQUET TIME: 13 min   DRAINS:  none   SPECIMENS:  Culture swabs x2 from purulent olecranon bursa    COMPLICATIONS: None apparent.   INDICATIONS: Joseph [name] is a 42 y.o. male who developed focal swelling about his olecranon after bumping his elbow ~2 weeks ago.  He noted increased pain, swelling, and erythema about the elbow with eventual spreading to his forearm and hand.  Imaging in the emergency department was also suggestive of septic olecranon bursitis.  He was admitted to the hospital and started on IV antibiotics.  He did not have significant improvement with antibiotics overnight so after discussion of risks, benefits, and alternatives, the patient agreed to proceed with surgery.  We discussed at length that risks of surgery included, but were not limited to, wound breakdown, recurrence, and residual pain after surgery.   DETAILS OF PROCEDURE: The patient was identified in the preoperative holding area.  The patient was brought to the operating room and placed on the table. Anesthesia was administered. The patient was then transferred to the lateral position on a beanbag. Operative arm was placed in an arm holder so elbow rested at 90 degrees. Arm was prescrubbed with Hibiclens and alcohol, prepped with ChloraPrep and draped in the usual sterile fashion. A surgical time-out occurred. A sterile, well-padded sterile tourniquet was placed.  The patient had already received scheduled IV antibiotics in the preoperative holding area.   The arm was elevated and tourniquet inflated to 251mmHg. A midline  incision was created about the elbow curving laterally to avoid the tip of the olecranon. We sharply dissected down to the level of the olecranon bursa.  A nick was made to the bursa and there was significant purulent appearing material.  Culture samples x2 were taken.  A combination of a rongeur, curette and knife was used to debride the olecranon bursa, which had signs of chronic infection. A rongeur and curette was used again to debride all loose and devitalized tissue including the remainder of the olecranon bursa and subcutaneous tissue. The triceps fascia and tip of the olecranon process were then visualized.  The distal portion of the triceps fascia was loose and debrided with a rongeur. The underlying triceps muscle was healthy and viable. The bone of the olecranon itself was not infected and appeared healthy. The wound was thoroughly irrigated.   Tourniquet was let down and hemostasis was achieved. The subdermal layer was closed with 2-0 PDS. The skin was closed with 3-0 nylon in a horizontal mattress fashion. The wound was dressed with Xeroform, fluffs, ABD, Webril, and ace wrap. Arm was placed in a sling.  Of note, prior to dressing placement, there was significant improvement in underlying forearm erythema compared to preoperative status   Instrument, sponge, and needle counts were correct prior to wound closure and at the conclusion of the case. The patient was then awakened from anesthesia without complication     POST-OPERATIVE PLAN: Patient will be continued on IV antibiotics.  The patient will remain in the sling and bulky dressing.  ASA 325 mg x 2 weeks for DVT prophylaxis.

## 2022-03-16 NOTE — Transfer of Care (Signed)
Immediate Anesthesia Transfer of Care Note  [name]  Procedure(s) Performed: I&D OLECRANON (ELBOW) BURSA (Right: Elbow)  Patient Location: PACU  Anesthesia Type:General  Level of Consciousness: awake and sedated  Airway & Oxygen Therapy: Patient Spontanous Breathing and Patient connected to face mask oxygen  Post-op Assessment: Report given to RN and Post -op Vital signs reviewed and stable  Post vital signs: Reviewed and stable  Last Vitals:  Vitals Value Taken Time  BP 125/91 03/16/22 1618  Temp 36.1 C 03/16/22 1618  Pulse 90 03/16/22 1625  Resp 21 03/16/22 1625  SpO2 99 % 03/16/22 1625  Vitals shown include unvalidated device data.  Last Pain:  Vitals:   03/16/22 1618  TempSrc:   PainSc: 0-No pain         Complications: No notable events documented.

## 2022-03-16 NOTE — Anesthesia Procedure Notes (Signed)
Procedure Name: Intubation Date/Time: 03/16/2022 2:54 PM  Performed by: Nelda Marseille, CRNAPre-anesthesia Checklist: Patient identified, Patient being monitored, Timeout performed, Emergency Drugs available and Suction available Patient Re-evaluated:Patient Re-evaluated prior to induction Oxygen Delivery Method: Circle system utilized Preoxygenation: Pre-oxygenation with 100% oxygen Induction Type: IV induction Ventilation: Mask ventilation without difficulty Laryngoscope Size: Mac, 3 and McGraph Grade View: Grade III Tube type: Oral Tube size: 7.0 mm Number of attempts: 1 Airway Equipment and Method: Stylet Placement Confirmation: ETT inserted through vocal cords under direct vision, positive ETCO2 and breath sounds checked- equal and bilateral Secured at: 21 cm Tube secured with: Tape Dental Injury: Teeth and Oropharynx as per pre-operative assessment

## 2022-03-16 NOTE — Care Management Important Message (Signed)
Important Message  Patient Details  Name: Joseph [name] MRN: [id number] Date of Birth: Aug 20, 1980   Medicare Important Message Given:  N/A - LOS <3 / Initial given by admissions     Dannette Barbara 03/16/2022, 4:48 PM

## 2022-03-17 ENCOUNTER — Encounter: Payer: Self-pay | Admitting: Orthopedic Surgery

## 2022-03-17 DIAGNOSIS — I10 Essential (primary) hypertension: Secondary | ICD-10-CM | POA: Diagnosis not present

## 2022-03-17 DIAGNOSIS — L03113 Cellulitis of right upper limb: Secondary | ICD-10-CM | POA: Diagnosis not present

## 2022-03-17 DIAGNOSIS — J4550 Severe persistent asthma, uncomplicated: Secondary | ICD-10-CM | POA: Diagnosis not present

## 2022-03-17 DIAGNOSIS — M71121 Other infective bursitis, right elbow: Secondary | ICD-10-CM | POA: Diagnosis not present

## 2022-03-17 LAB — CBC
HCT: 41.7 % (ref 39.0–52.0)
Hemoglobin: 12.8 g/dL — ABNORMAL LOW (ref 13.0–17.0)
MCH: 25.3 pg — ABNORMAL LOW (ref 26.0–34.0)
MCHC: 30.7 g/dL (ref 30.0–36.0)
MCV: 82.4 fL (ref 80.0–100.0)
Platelets: 451 10*3/uL — ABNORMAL HIGH (ref 150–400)
RBC: 5.06 MIL/uL (ref 4.22–5.81)
RDW: 16.4 % — ABNORMAL HIGH (ref 11.5–15.5)
WBC: 25.4 10*3/uL — ABNORMAL HIGH (ref 4.0–10.5)
nRBC: 0 % (ref 0.0–0.2)

## 2022-03-17 LAB — CREATININE, SERUM
Creatinine, Ser: 0.92 mg/dL (ref 0.61–1.24)
GFR, Estimated: >60 mL/min (ref >60)

## 2022-03-17 MED ORDER — IPRATROPIUM-ALBUTEROL 0.5-2.5 (3) MG/3ML IN SOLN
3.0000 mL | Freq: Three times a day (TID) | RESPIRATORY_TRACT | Status: DC
  Administered 2022-03-17 – 2022-03-18 (×3): 3 mL via RESPIRATORY_TRACT
  Filled 2022-03-17 (×4): qty 3

## 2022-03-17 MED ORDER — HYDROCODONE-ACETAMINOPHEN 5-325 MG PO TABS: 1.0000 | ORAL_TABLET | ORAL | 0 refills | Status: DC | PRN

## 2022-03-17 MED ORDER — ASPIRIN 325 MG PO TABS: 325.0000 mg | ORAL_TABLET | Freq: Every day | ORAL | Status: AC

## 2022-03-17 MED ORDER — ENOXAPARIN SODIUM 40 MG/0.4ML IJ SOSY
40.0000 mg | PREFILLED_SYRINGE | Freq: Every day | INTRAMUSCULAR | Status: DC
  Administered 2022-03-17: 40 mg via SUBCUTANEOUS
  Filled 2022-03-17: qty 0.4

## 2022-03-17 NOTE — Progress Notes (Signed)
[facility] [place] [facility] [employer] NAME: Joseph [name]    MR#:  [id number]  DATE OF BIRTH:  07/30/1980  SUBJECTIVE:  patient eating lunch. Overall feels a lot better after surgery for right elbow abscess. No fever. Pain much under control.    VITALS:  Blood pressure (!) 148/94, pulse 86, temperature 98.1 F (36.7 C), temperature source Oral, resp. rate 17, height 5\' 5"  (1.651 m), weight 83.2 kg, SpO2 97 %.  PHYSICAL EXAMINATION:   GENERAL:  42 y.o.-year-old patient with no acute distress. Obesity LUNGS: Normal breath sounds bilaterally, no wheezing CARDIOVASCULAR: S1, S2 normal. No murmur   ABDOMEN: Soft, nontender, nondistended. Bowel sounds present.  EXTREMITIES:    NEUROLOGIC: nonfocal  patient is alert and awake SKIN: as above  LABORATORY PANEL:  CBC Recent Labs  Lab 03/17/22 0859  WBC 25.4*  HGB 12.8*  HCT 41.7  PLT 451*     Chemistries  Recent Labs  Lab 03/16/22 0320 03/17/22 0859  NA 141  --   K 3.6  --   CL 105  --   CO2 29  --   GLUCOSE 82  --   BUN 21*  --   CREATININE 0.89 0.92  CALCIUM 8.0*  --     Cardiac Enzymes No results for input(s): "TROPONINI" in the last 168 hours. RADIOLOGY:  No results found.  Assessment and Plan  Canon Gola is a 42 y.o. male with medical history significant of hypertension, severe persistent asthma, who presents to the ED due to arm swelling.  pt states that approximately 2 weeks ago, he bumped his elbow when moving things around his home.  He did not think much of it at the time, and denies any skin breakage.  Approximately 2-3 days later, he noticed that his forearm and hand are beginning to gradually swell.   Right arm venous duplex was obtained that did not show any DVT.   CT of the forearm  demonstrated severe cellulitis, rim enhancing fluid collection in the region of the olecranon bursa highly suspicious for septic bursitis. This fluid collection measures a maximum of 14  x 6.6 x 3.5cm. There is also a moderate-sized elbow joint effusion.in addition to inflammation of the distal triceps muscle concerning for fasciitis.   Septic Olecranon bursitis of elbow, right Patient is presenting with significant forearm and hand swelling in the setting of possible trauma approximately 2 weeks ago.  elevated WBC, tachycardia and abnormal CT  - Vancomycin and Rocephin  - Tylenol as needed for fever - Norco and Dilaudid for pain control - Monitor for signs  - Orthopedic surgery consult with Dr Posey Pronto appreciate their recommendations  s/p Right olecranon bursectomy. Right elbow debridement including deep fascia and subcutaneous tissue - Blood cultures was not ordered in the ER--too late already got IV abxs! --Surgical deep wound culture--[facility] --wbc 25K--25K--cbc in am --LA 1.5, 0.9   Severe persistent asthma Eczema - Continue home bronchodilators, prednisone and Dupixent - Albuterol nebs as needed every 2 hours   Lymphedema of both lower extremities  - Continue compression stocking - Consider referral for lymphoscintigraphy as outpt   Essential hypertension - Continue home antihypertensives    Procedures: Right olecranon bursectomy. Right elbow debridement including deep fascia and subcutaneous tissue Family communication :none today Consults :Orthopedic CODE STATUS: Full DVT Prophylaxis lovneox Level of care: Telemetry Medical Status is: Inpatient Remains inpatient appropriate because: I and D of olecranon bursitis today and IV abxs Anticipate d/c 1-3  days    TOTAL TIME TAKING CARE OF THIS PATIENT: 35 minutes.  >50% time spent on counselling and coordination of care  Note: This dictation was prepared with Dragon dictation along with smaller phrase technology. Any transcriptional errors that result from this process are unintentional.  Fritzi Mandes M.D    [facility]   CC: Primary care physician; Arlina Robes, MD

## 2022-03-17 NOTE — Anesthesia Postprocedure Evaluation (Signed)
Anesthesia Post Note  [name]  Procedure(s) Performed: I&D OLECRANON (ELBOW) BURSA (Right: Elbow)  Patient location during evaluation: PACU Anesthesia Type: General Level of consciousness: awake and alert Pain management: pain level controlled Vital Signs Assessment: post-procedure vital signs reviewed and stable Respiratory status: spontaneous breathing, nonlabored ventilation and respiratory function stable Cardiovascular status: blood pressure returned to baseline and stable Postop Assessment: no apparent nausea or vomiting Anesthetic complications: no   No notable events documented.   Last Vitals:  Vitals:   03/16/22 2251 03/16/22 2255  BP: (!) 151/102   Pulse: (!) 103   Resp: 20   Temp: 36.7 C   SpO2: (!) 89% 92%    Last Pain:  Vitals:   03/17/22 0017  TempSrc:   PainSc: Asleep                 [place] Ouch

## 2022-03-17 NOTE — Progress Notes (Signed)
  Subjective: 1 Day Post-Op Procedure(s) (LRB): I&D OLECRANON (ELBOW) BURSA (Right) Patient reports pain as mild.   Patient is well, and has had no acute complaints or problems Plan is to go Home after hospital stay. Negative for chest pain and shortness of breath Fever: no Gastrointestinal: Negative for nausea and vomiting  Objective: Vital signs in last 24 hours: Temp:  [96.9 F (36.1 C)-98.1 F (36.7 C)] 98.1 F (36.7 C) (02/02 0809) Pulse Rate:  [86-103] 86 (02/02 0809) Resp:  [14-21] 17 (02/02 0809) BP: (122-151)/(80-104) 148/94 (02/02 0809) SpO2:  [89 %-100 %] 94 % (02/02 0809) Weight:  [83.2 kg] 83.2 kg (02/01 1338)  Intake/Output from previous day:  Intake/Output Summary (Last 24 hours) at 03/17/2022 0829 Last data filed at 03/17/2022 0500 Gross per 24 hour  Intake 508 ml  Output --  Net 508 ml    Intake/Output this shift: No intake/output data recorded.  Labs: Recent Labs    03/15/22 1042 03/16/22 0320  HGB 14.0 13.6   Recent Labs    03/15/22 1042 03/16/22 0320  WBC 25.6* 25.2*  RBC 5.48 5.29  HCT 45.7 44.9  PLT 478* 458*   Recent Labs    03/15/22 1042 03/16/22 0320  NA 141 141  K 3.6 3.6  CL 101 105  CO2 30 29  BUN 24* 21*  CREATININE 1.01 0.89  GLUCOSE 100* 82  CALCIUM 8.5* 8.0*   No results for input(s): "LABPT", "INR" in the last 72 hours.   EXAM General - Patient is Alert and Oriented Extremity - Neurovascular intact Sensation intact distally Elbow extension to -20 degrees and flexion to 90 degrees with mild discomfort.  Positive edema.  No erythema. Dressing/Incision - clean, dry, no drainage Motor Function - intact, moving of elbow and fingers well on exam.   Past Medical History:  Diagnosis Date   Asthma    COPD (chronic obstructive pulmonary disease) (HCC)    Eczema    Hypertension     Assessment/Plan: 1 Day Post-Op Procedure(s) (LRB): I&D OLECRANON (ELBOW) BURSA (Right) Principal Problem:   Cellulitis of right upper  extremity Active Problems:   Essential hypertension   Lymphedema of both lower extremities   Septic bursitis of elbow, right   Severe persistent asthma  Estimated body mass index is 30.52 kg/m as calculated from the following:   Height as of this encounter: 5\' 5"  (1.651 m).   Weight as of this encounter: 83.2 kg. Advance diet Up with therapy  DVT Prophylaxis - Aspirin   Reche Dixon, [name] Orthopaedic Surgery 03/17/2022, 8:29 AM

## 2022-03-17 NOTE — Progress Notes (Signed)
Patient moved to new room, to free up airborne isolation room for another patient. Patient agreeable to move.

## 2022-03-17 NOTE — Consult Note (Signed)
Pharmacy Antibiotic Note  Joseph [name] is a 42 y.o. male with medical history including HTN, asthma on prednisone and Dupixent admitted on 03/15/2022 with  right arm cellulitis complicated by infected / septic olecranon bursitis . Orthopaedics is following. Pharmacy has been consulted for vancomycin dosing. Patient is also ordered ceftriaxone.  Plan:  Continue vancomycin 1.25 g IV q12h --Calculated AUC: 511, Cmin 14.2 --Daily Scr per protocol --Levels at steady state or as clinically indicated Will consider checking levels 2/3 if patient remains on vancomycin  Height: 5\' 5"  (165.1 cm) Weight: 83.2 kg (183 lb 6.8 oz) IBW/kg (Calculated) : 61.5  Temp (24hrs), Avg:97.7 F (36.5 C), Min:96.9 F (36.1 C), Max:98.1 F (36.7 C)  Recent Labs  Lab 03/15/22 1042 03/15/22 1250 03/16/22 0320  WBC 25.6*  --  25.2*  CREATININE 1.01  --  0.89  LATICACIDVEN 1.5 0.9  --      Estimated Creatinine Clearance: 106.3 mL/min (by C-G formula based on SCr of 0.89 mg/dL).    Allergies  Allergen Reactions   Penicillins Shortness Of Breath   Pork-Derived Products Nausea Only   Banana Hives   Coconut (Cocos Nucifera) Hives    Antimicrobials this admission: Ceftriaxone 1/31 >>  Vancomycin 1/31 >>   Dose adjustments this admission: N/A  Microbiology results: 2/1 OR cultures: GPC, pending  Thank you for allowing pharmacy to be a part of this patient's care.  Benita Gutter 03/17/2022 7:54 AM

## 2022-03-17 NOTE — Discharge Instructions (Signed)
Elbow Surgery Post-Op Instructions   1. Splint/Cast: You will have a splint (3/4 cast) on your arm after surgery. Ensure that this remains clean and dry until follow up appointment. If this becomes wet, you need to call our offices to get it changed or else you risk skin breakdown.     2. Driving:  Plan on not driving for at least 2 weeks. Please note that you are advised NOT to drive while taking narcotic pain medications as you may be impaired and unsafe to drive.   3. Activity: Weight bearing: Non-weight bearing. Use sling for comfort as needed.    4. Medications:  - You have been provided a prescription for narcotic pain medicine. After surgery, take 1-2 narcotic tablets every 4 hours if needed for severe pain. Please start this as soon as you begin to start having pain (if you received a nerve block, start taking as soon as this wears off).  - A prescription for anti-nausea medication will be provided in case the narcotic medicine causes nausea - take 1 tablet every 6 hours only if nauseated.  -Take tylenol 1000 mg every 8 hours for pain.  May stop tylenol 5 days after surgery if you are having minimal pain. -Take ibuprofen 800mg  3x/day for pain and swelling. May stop after 3 days if you are having minimal pain. Please stop taking if this causes GI irritation. Do not take if you have known kidney disease. -Take aspirin 325mg /day x 2 weeks to help prevent DVTs (blood clots).    If you are taking prescription medication for anxiety, depression, insomnia, muscle spasm, chronic pain, or for attention deficit disorder you are advised that you are at a higher risk of adverse effects with use of narcotics post-op, including narcotic addiction/dependence, depressed breathing, death. If you use non-prescribed substances: alcohol, marijuana, cocaine, heroin, methamphetamines, etc., you are at a higher risk of adverse effects with use of narcotics post-op, including narcotic addiction/dependence,  depressed breathing, death. You are advised that taking > 50 morphine milligram equivalents (MME) of narcotic pain medication per day results in twice the risk of overdose or death. For your prescription provided: oxycodone 5 mg - taking more than 6 tablets per day. Be advised that we will prescribe narcotics short-term, for acute post-operative pain only.   6. Physical Therapy: No planned therapy for the first 2 weeks. We can discuss need for PT after 1st follow up visit in 2 weeks.    7. Work/School: May do light duty/desk job or return to school in approximately 1-2 weeks when off of narcotics, pain is well-controlled, and swelling has decreased. May not return to full work for at least 2 weeks.    8. Post-Op Appointments: Your first post-op appointment will be with Dr. Posey Pronto in approximately 2 weeks time.  If you find that they have not been scheduled please call the Orthopaedic Appointment front desk at [phone].

## 2022-03-18 DIAGNOSIS — L03113 Cellulitis of right upper limb: Secondary | ICD-10-CM | POA: Diagnosis not present

## 2022-03-18 LAB — CBC
HCT: 38.4 % — ABNORMAL LOW (ref 39.0–52.0)
Hemoglobin: 11.9 g/dL — ABNORMAL LOW (ref 13.0–17.0)
MCH: 25.6 pg — ABNORMAL LOW (ref 26.0–34.0)
MCHC: 31 g/dL (ref 30.0–36.0)
MCV: 82.6 fL (ref 80.0–100.0)
Platelets: 442 10*3/uL — ABNORMAL HIGH (ref 150–400)
RBC: 4.65 MIL/uL (ref 4.22–5.81)
RDW: 16.3 % — ABNORMAL HIGH (ref 11.5–15.5)
WBC: 22.3 10*3/uL — ABNORMAL HIGH (ref 4.0–10.5)
nRBC: 0 % (ref 0.0–0.2)

## 2022-03-18 LAB — CREATININE, SERUM
Creatinine, Ser: 0.75 mg/dL (ref 0.61–1.24)
GFR, Estimated: >60 mL/min (ref >60)

## 2022-03-18 MED ORDER — SULFAMETHOXAZOLE-TRIMETHOPRIM 800-160 MG PO TABS: 1.0000 | ORAL_TABLET | Freq: Two times a day (BID) | ORAL | Status: DC

## 2022-03-18 MED ORDER — SULFAMETHOXAZOLE-TRIMETHOPRIM 800-160 MG PO TABS: 1.0000 | ORAL_TABLET | Freq: Two times a day (BID) | ORAL | 0 refills | Status: AC

## 2022-03-18 MED ORDER — SULFAMETHOXAZOLE-TRIMETHOPRIM 800-160 MG PO TABS: 11.0000 | ORAL_TABLET | Freq: Two times a day (BID) | ORAL | 0 refills | Status: DC

## 2022-03-18 MED ORDER — SULFAMETHOXAZOLE-TRIMETHOPRIM 800-160 MG PO TABS: 11.0000 | ORAL_TABLET | Freq: Two times a day (BID) | ORAL | Status: DC

## 2022-03-18 NOTE — Progress Notes (Signed)
  Subjective: 2 Days Post-Op Procedure(s) (LRB): I&D OLECRANON (ELBOW) BURSA (Right) Patient reports pain as mild this morning, states it was throbbing last night. Patient is well, and has had no acute complaints or problems Plan is to go Home after hospital stay. Negative for chest pain and shortness of breath Fever: no Gastrointestinal:Negative for nausea and vomiting  Objective: Vital signs in last 24 hours: Temp:  [98.1 F (36.7 C)-98.5 F (36.9 C)] 98.4 F (36.9 C) (02/02 2050) Pulse Rate:  [86-98] 90 (02/03 0528) Resp:  [16-18] 18 (02/03 0528) BP: (124-148)/(94-98) 141/98 (02/03 0528) SpO2:  [93 %-97 %] 94 % (02/03 0528)  Intake/Output from previous day:  Intake/Output Summary (Last 24 hours) at 03/18/2022 0805 Last data filed at 03/17/2022 0945 Gross per 24 hour  Intake 3 ml  Output --  Net 3 ml    Intake/Output this shift: No intake/output data recorded.  Labs: Recent Labs    03/15/22 1042 03/16/22 0320 03/17/22 0859 03/18/22 0619  HGB 14.0 13.6 12.8* 11.9*   Recent Labs    03/17/22 0859 03/18/22 0619  WBC 25.4* 22.3*  RBC 5.06 4.65  HCT 41.7 38.4*  PLT 451* 442*   Recent Labs    03/15/22 1042 03/16/22 0320 03/17/22 0859 03/18/22 0619  NA 141 141  --   --   K 3.6 3.6  --   --   CL 101 105  --   --   CO2 30 29  --   --   BUN 24* 21*  --   --   CREATININE 1.01 0.89 0.92 0.75  GLUCOSE 100* 82  --   --   CALCIUM 8.5* 8.0*  --   --    No results for input(s): "LABPT", "INR" in the last 72 hours.   EXAM General - Patient is Alert, Appropriate, and Oriented Extremity - ACE wrap applied to the right arm. ACE wrap removed this morning, incision healing well without any purulent material. Minimal surrounding erythema, no streaking into the forearm. No fluid collection is seen. Mild swelling in the forearm and hand, he is able to flex and extend all fingers without much pain. Intact to light touch to the right arm. New bulky dressing applied with  ACE wrap.     Past Medical History:  Diagnosis Date   Asthma    COPD (chronic obstructive pulmonary disease) (HCC)    Eczema    Hypertension     Assessment/Plan: 2 Days Post-Op Procedure(s) (LRB): I&D OLECRANON (ELBOW) BURSA (Right) Principal Problem:   Cellulitis of right upper extremity Active Problems:   Essential hypertension   Lymphedema of both lower extremities   Septic bursitis of elbow, right   Severe persistent asthma  Estimated body mass index is 30.52 kg/m as calculated from the following:   Height as of this encounter: 5\' 5"  (1.651 m).   Weight as of this encounter: 83.2 kg. Advance diet Up with therapy  Labs reviewed, WBC still elevated, but decreased to 22.3 this morning. New dressing applied, erythema is much improved. Gram stain with few gram + cocci in pairs.  Culture still pending. Currently on IV Vancomycin and Rocephin. Continue with bulky dressing to the right arm. Discharge pending final culture results.  DVT Prophylaxis - Lovenox Weightbearing as tolerated to the right arm.  Raquel Amelianna Meller, [name] [facility] Orthopaedic Surgery 03/18/2022, 8:05 AM

## 2022-03-18 NOTE — Discharge Summary (Signed)
Physician Discharge Summary   [name] MRN: [id number] DOB: Sep 29, 1980  Admit date:     03/15/2022  Discharge date: 03/18/22  Discharge Physician: Fritzi Mandes   PCP: Arlina Robes, MD   Recommendations at discharge:   follow-up orthopedic Dr. Posey Pronto in two weeks for  post-op wound check.  Discharge Diagnoses: Principal Problem:   Cellulitis of right upper extremity Active Problems:   Septic bursitis of elbow, right   Essential hypertension   Lymphedema of both lower extremities   Severe persistent asthma  Joseph [name] is a 42 y.o. male with medical history significant of hypertension, severe persistent asthma, who presents to the ED due to arm swelling.  pt states that approximately 2 weeks ago, he bumped his elbow when moving things around his home.  He did not think much of it at the time, and denies any skin breakage.  Approximately 2-3 days later, he noticed that his forearm and hand are beginning to gradually swell.    Right arm venous duplex was obtained that did not show any DVT.    CT of the forearm  demonstrated severe cellulitis, rim enhancing fluid collection in the region of the olecranon bursa highly suspicious for septic bursitis. This fluid collection measures a maximum of 14 x 6.6 x 3.5cm. There is also a moderate-sized elbow joint effusion.in addition to inflammation of the distal triceps muscle concerning for fasciitis.    Septic Olecranon bursitis of elbow, right Patient is presenting with significant forearm and hand swelling in the setting of possible trauma approximately 2 weeks ago.  elevated WBC, tachycardia and abnormal CT  - Vancomycin and Rocephin  - Tylenol as needed for fever - Norco prn for pain control - Orthopedic surgery consult with Dr Posey Pronto appreciate their recommendations  s/p Right olecranon bursectomy. Right elbow debridement including deep fascia and subcutaneous tissue - Blood cultures was not ordered in the ER--too late  already got IV abxs! --Surgical deep wound culture--[facility]--Staph aureus. Sensitivity pending. D/w Pharmacist--Change to po bactrim --wbc 25K--25K--22K, afebrile --LA 1.5, 0.9   Severe persistent asthma Eczema - Continue home bronchodilators, prednisone and Dupixent - Albuterol nebs as needed every 2 hours   Lymphedema of both lower extremities  - Continue compression stocking - Consider referral for lymphoscintigraphy as outpt   Essential hypertension - Continue home antihypertensives     Procedures: Right olecranon bursectomy. Right elbow debridement including deep fascia and subcutaneous tissue Family communication :none today Consults :Orthopedic CODE STATUS: Full DVT Prophylaxis lovneox  Overall improving. Discussed discharge plan with patient and he is in agreement. No family at bedside    Pain control - [name] was reviewed. and patient was instructed, not to drive, operate heavy machinery, perform activities at heights, swimming or participation in water activities or provide baby-sitting services while on Pain, Sleep and Anxiety Medications; until their outpatient Physician has advised to do so again. Also recommended to not to take more than prescribed Pain, Sleep and Anxiety Medications.  Diet recommendation:  Discharge Diet Orders (From admission, onward)     Start     Ordered   03/18/22 0000  Diet - low sodium heart healthy        03/18/22 1257           Cardiac diet DISCHARGE MEDICATION: Allergies as of 03/18/2022       Reactions   Penicillins Shortness Of Breath   Pork-derived Products Nausea Only   Banana Hives   Coconut (cocos  Nucifera) Hives        Medication List     TAKE these medications    albuterol 108 (90 Base) MCG/ACT inhaler Commonly known as: VENTOLIN HFA Inhale 2 puffs into the lungs every 6 (six) hours as needed for wheezing or shortness of breath.   albuterol (2.5 MG/3ML)  0.083% nebulizer solution Commonly known as: PROVENTIL Take 2.5 mg by nebulization every 6 (six) hours as needed for wheezing or shortness of breath.   aspirin 325 MG tablet Take 1 tablet (325 mg total) by mouth daily.   Dupixent 300 MG/2ML prefilled syringe Generic drug: dupilumab Inject 300 mg into the skin every 14 (fourteen) days.   fluticasone-salmeterol 230-21 MCG/ACT inhaler Commonly known as: ADVAIR HFA Inhale 2 puffs into the lungs every 12 (twelve) hours.   furosemide 20 MG tablet Commonly known as: LASIX Take 20 mg by mouth daily as needed for edema or fluid.   hydrochlorothiazide 25 MG tablet Commonly known as: HYDRODIURIL Take 25 mg by mouth daily.   HYDROcodone-acetaminophen 5-325 MG tablet Commonly known as: NORCO/VICODIN Take 1-2 tablets by mouth every 4 (four) hours as needed for moderate pain or severe pain.   ibuprofen 800 MG tablet Commonly known as: ADVIL Take 800 mg by mouth daily as needed for fever, headache, mild pain, moderate pain or cramping.   losartan 100 MG tablet Commonly known as: COZAAR Take 1 tablet (100 mg total) by mouth daily.   montelukast 10 MG tablet Commonly known as: SINGULAIR Take 10 mg by mouth at bedtime.   omeprazole 20 MG capsule Commonly known as: PRILOSEC Take 20 mg by mouth daily as needed (GERD symptoms).   predniSONE 2.5 MG tablet Commonly known as: DELTASONE Take 7.5 mg by mouth daily.   sulfamethoxazole-trimethoprim 800-160 MG tablet Commonly known as: BACTRIM DS Take 11 tablets by mouth every 12 (twelve) hours for 13 doses.   tiZANidine 2 MG tablet Commonly known as: ZANAFLEX Take 2 mg by mouth 3 (three) times daily.   tobramycin 0.3 % ophthalmic solution Commonly known as: TOBREX Place 1-2 drops into the right eye in the morning, at noon, in the evening, and at bedtime.        Follow-up Information     Reche Dixon, [name] Follow up in 2 week(s).   Specialty: Orthopedic Surgery Why: For wound  care Contact information: [address] [place] [address] [phone]         Arlina Robes, MD. Schedule an appointment as soon as possible for a visit.   Specialty: Family Medicine Contact information: [place] [address] [phone]                Discharge Exam: Danley Danker Weights   03/15/22 1700 03/16/22 1338  Weight: 83.2 kg 83.2 kg     Condition at discharge: fair  The results of significant diagnostics from this hospitalization (including imaging, microbiology, ancillary and laboratory) are listed below for reference.   Imaging Studies: CT FOREARM RIGHT W CONTRAST  Result Date: 03/15/2022 CLINICAL DATA:  Pain and swelling. EXAM: CT OF THE UPPER RIGHT EXTREMITY WITH CONTRAST TECHNIQUE: Multidetector CT imaging of the upper right extremity was performed according to the standard protocol following intravenous contrast administration. RADIATION DOSE REDUCTION: This exam was performed according to the departmental dose-optimization program which includes automated exposure control, adjustment of the mA and/or kV according to patient size and/or use of iterative reconstruction technique. CONTRAST:  157mL OMNIPAQUE IOHEXOL 300 MG/ML  SOLN COMPARISON:  Radiographs, same date. FINDINGS: Severe diffuse subcutaneous soft tissue swelling/edema/fluid involving the entire imaged right upper extremity from just above the elbow joint down to the hand. Findings consistent with severe cellulitis. At the level of the elbow there is a rim enhancing fluid collection in the region of the olecranon bursa highly suspicious for septic bursitis. This fluid collection measures a maximum of 14 x 6.6 x 3.5 cm. There is also a moderate-sized elbow joint effusion. Could not exclude septic arthritis. No destructive bony changes to suggest osteomyelitis. Signal abnormality in the distal triceps muscle is suspicious for myofasciitis and could not  exclude pyomyositis. Do not see any definite findings for myofasciitis or pyomyositis involving the forearm musculature. The wrist joint is maintained. No findings suspicious for septic arthritis or osteomyelitis involving the wrist, hand or radius/ulna. The major vascular structures are patent. IMPRESSION: 1. Severe diffuse subcutaneous soft tissue swelling/edema/fluid involving the entire imaged right upper extremity from just above the elbow joint down to the hand. Findings consistent with severe cellulitis. 2. Rim enhancing fluid collection in the region of the olecranon bursa highly suspicious for septic bursitis. 3. Moderate-sized elbow joint effusion. Could not exclude septic arthritis. 4. Suspect inflammation/edema in the distal triceps muscle, suspicious for myofasciitis and could not exclude pyomyositis. 5. No findings suspicious for septic arthritis or osteomyelitis involving the wrist, hand or radius/ulna. Electronically Signed   By: Rudie Meyer M.D.   On: 03/15/2022 13:59   US Venous Img Upper Right (DVT Study)  Result Date: 03/15/2022 CLINICAL DATA:  Right upper extremity edema for the past 2 weeks. Evaluate for DVT. EXAM: RIGHT UPPER EXTREMITY VENOUS DOPPLER ULTRASOUND TECHNIQUE: Gray-scale sonography with graded compression, as well as color Doppler and duplex ultrasound were performed to evaluate the upper extremity deep venous system from the level of the subclavian vein and including the jugular, axillary, basilic, radial, ulnar and upper cephalic vein. Spectral Doppler was utilized to evaluate flow at rest and with distal augmentation maneuvers. COMPARISON:  None Available. FINDINGS: Contralateral Subclavian Vein: Respiratory phasicity is normal and symmetric with the symptomatic side. No evidence of thrombus. Normal compressibility. Internal Jugular Vein: No evidence of thrombus. Normal compressibility, respiratory phasicity and response to augmentation. Subclavian Vein: No evidence of  thrombus. Normal compressibility, respiratory phasicity and response to augmentation. Axillary Vein: No evidence of thrombus. Normal compressibility, respiratory phasicity and response to augmentation. Cephalic Vein: No evidence of thrombus. Normal compressibility, respiratory phasicity and response to augmentation. Basilic Vein: No evidence of thrombus. Normal compressibility, respiratory phasicity and response to augmentation. Brachial Veins: No evidence of thrombus. Normal compressibility, respiratory phasicity and response to augmentation. Radial Veins: No evidence of thrombus. Normal compressibility, respiratory phasicity and response to augmentation. Ulnar Veins: No evidence of thrombus. Normal compressibility, respiratory phasicity and response to augmentation. Other Findings: There is a minimal amount of subcutaneous edema at the level of the forearm. IMPRESSION: No evidence of DVT within the right upper extremity. Electronically Signed   By: Simonne Come M.D.   On: 03/15/2022 12:36   DG Forearm Right  Result Date: 03/15/2022 CLINICAL DATA:  Right arm pain and swelling after injury. EXAM: RIGHT FOREARM - 2 VIEW COMPARISON:  None Available. FINDINGS: There is no evidence of fracture or other focal bone lesions. Soft tissues are unremarkable. IMPRESSION: Negative. Electronically Signed   By: Lupita Raider M.D.   On: 03/15/2022 11:43   DG Hand Complete Right  Result Date: 03/15/2022 CLINICAL DATA:  Right arm pain and  swelling after injury. EXAM: RIGHT HAND - COMPLETE 3+ VIEW COMPARISON:  None Available. FINDINGS: There is no evidence of fracture or dislocation. There is no evidence of arthropathy or other focal bone abnormality. Soft tissues are unremarkable. IMPRESSION: Negative. Electronically Signed   By: Marijo Conception M.D.   On: 03/15/2022 11:41    Microbiology: Results for orders placed or performed during the hospital encounter of 03/15/22  Aerobic/Anaerobic Culture w Gram Stain  (surgical/deep wound)     Status: None (Preliminary result)   Collection Time: 03/16/22  3:12 PM   Specimen: PATH Other; Tissue  Result Value Ref Range Status   Specimen Description   Final    WOUND Performed at [facility], [address] [place], [place] [address]    Special Requests   Final    RIGHT SEPTIC Elvin So Performed at [facility], [address] [place], [place] [address]    Gram Stain   Final    FEW WBC PRESENT, PREDOMINANTLY MONONUCLEAR FEW GRAM POSITIVE COCCI IN PAIRS IN SINGLES    Culture   Final    FEW STAPHYLOCOCCUS AUREUS SUSCEPTIBILITIES TO FOLLOW Performed at [facility], [place] [address] [place], [place] [address]    Report Status PENDING  Incomplete  Aerobic/Anaerobic Culture w Gram Stain (surgical/deep wound)     Status: None (Preliminary result)   Collection Time: 03/16/22  3:13 PM   Specimen: PATH Other; Tissue  Result Value Ref Range Status   Specimen Description   Final    WOUND Performed at [facility], [address], [place], [place] [address]    Special Requests RIGHT SEPTIC OLECRANON BURSA  Final   Gram Stain   Final    FEW WBC PRESENT,BOTH PMN AND MONONUCLEAR FEW GRAM POSITIVE COCCI IN PAIRS IN SINGLES    Culture   Final    TOO YOUNG TO READ Performed at [facility], [place] [address], [place], [place] [address]    Report Status PENDING  Incomplete    Labs: CBC: Recent Labs  Lab 03/15/22 1042 03/16/22 0320 03/17/22 0859 03/18/22 0619  WBC 25.6* 25.2* 25.4* 22.3*  NEUTROABS  --  19.7*  --   --   HGB 14.0 13.6 12.8* 11.9*  HCT 45.7 44.9 41.7 38.4*  MCV 83.4 84.9 82.4 82.6  PLT 478* 458* 451* 784*   Basic Metabolic Panel: Recent Labs  Lab 03/15/22 1042 03/16/22 0320 03/17/22 0859 03/18/22 0619  NA 141 141  --   --   K 3.6 3.6  --   --   CL 101 105  --   --   CO2 30 29  --   --   GLUCOSE 100* 82  --   --   BUN 24* 21*  --   --   CREATININE 1.01 0.89 0.92 0.75   CALCIUM 8.5* 8.0*  --   --     Discharge time spent: greater than 30 minutes.  Signed: Fritzi Mandes, MD [facility] 03/18/2022

## 2022-03-21 LAB — AEROBIC/ANAEROBIC CULTURE W GRAM STAIN (SURGICAL/DEEP WOUND)

## 2022-10-12 ENCOUNTER — Other Ambulatory Visit: Payer: Self-pay | Admitting: Specialist

## 2022-10-12 DIAGNOSIS — J4550 Severe persistent asthma, uncomplicated: Secondary | ICD-10-CM

## 2022-10-12 DIAGNOSIS — R918 Other nonspecific abnormal finding of lung field: Secondary | ICD-10-CM

## 2022-10-13 ENCOUNTER — Ambulatory Visit
Admission: RE | Admit: 2022-10-13 | Discharge: 2022-10-13 | Disposition: A | Discharge status: Home / Self Care | Payer: Medicare Other | Source: Ambulatory Visit | Attending: Specialist | Admitting: Specialist

## 2022-10-13 DIAGNOSIS — R918 Other nonspecific abnormal finding of lung field: Secondary | ICD-10-CM | POA: Diagnosis present

## 2022-10-13 DIAGNOSIS — J4550 Severe persistent asthma, uncomplicated: Secondary | ICD-10-CM | POA: Diagnosis not present

## 2022-12-12 ENCOUNTER — Other Ambulatory Visit: Payer: Self-pay | Admitting: Sports Medicine

## 2022-12-12 DIAGNOSIS — M7062 Trochanteric bursitis, left hip: Secondary | ICD-10-CM

## 2022-12-12 DIAGNOSIS — M25552 Pain in left hip: Secondary | ICD-10-CM

## 2022-12-14 ENCOUNTER — Ambulatory Visit
Admission: RE | Admit: 2022-12-14 | Discharge: 2022-12-14 | Disposition: A | Discharge status: Home / Self Care | Payer: Medicare Other | Source: Ambulatory Visit | Attending: Sports Medicine | Admitting: Sports Medicine

## 2022-12-14 DIAGNOSIS — M7062 Trochanteric bursitis, left hip: Secondary | ICD-10-CM | POA: Insufficient documentation

## 2022-12-14 DIAGNOSIS — M25552 Pain in left hip: Secondary | ICD-10-CM | POA: Insufficient documentation

## 2023-01-17 ENCOUNTER — Other Ambulatory Visit: Payer: Self-pay

## 2023-01-17 ENCOUNTER — Inpatient Hospital Stay
Admission: EM | Admit: 2023-01-17 | Discharge: 2023-01-25 | DRG: 871 | Disposition: A | Discharge status: Home Health | Payer: Medicare Other | Attending: Osteopathic Medicine | Admitting: Osteopathic Medicine

## 2023-01-17 ENCOUNTER — Emergency Department: Payer: Medicare Other

## 2023-01-17 ENCOUNTER — Inpatient Hospital Stay: Payer: Medicare Other

## 2023-01-17 DIAGNOSIS — M4626 Osteomyelitis of vertebra, lumbar region: Secondary | ICD-10-CM | POA: Diagnosis not present

## 2023-01-17 DIAGNOSIS — M879 Osteonecrosis, unspecified: Secondary | ICD-10-CM | POA: Diagnosis present

## 2023-01-17 DIAGNOSIS — Z79899 Other long term (current) drug therapy: Secondary | ICD-10-CM | POA: Diagnosis not present

## 2023-01-17 DIAGNOSIS — S73192D Other sprain of left hip, subsequent encounter: Secondary | ICD-10-CM

## 2023-01-17 DIAGNOSIS — J4551 Severe persistent asthma with (acute) exacerbation: Secondary | ICD-10-CM | POA: Diagnosis present

## 2023-01-17 DIAGNOSIS — R6510 Systemic inflammatory response syndrome (SIRS) of non-infectious origin without acute organ dysfunction: Secondary | ICD-10-CM | POA: Diagnosis not present

## 2023-01-17 DIAGNOSIS — M79605 Pain in left leg: Secondary | ICD-10-CM | POA: Diagnosis not present

## 2023-01-17 DIAGNOSIS — E66811 Obesity, class 1: Secondary | ICD-10-CM | POA: Diagnosis present

## 2023-01-17 DIAGNOSIS — J9621 Acute and chronic respiratory failure with hypoxia: Secondary | ICD-10-CM | POA: Diagnosis not present

## 2023-01-17 DIAGNOSIS — M5136 Other intervertebral disc degeneration, lumbar region with discogenic back pain only: Secondary | ICD-10-CM | POA: Diagnosis not present

## 2023-01-17 DIAGNOSIS — F17210 Nicotine dependence, cigarettes, uncomplicated: Secondary | ICD-10-CM | POA: Diagnosis present

## 2023-01-17 DIAGNOSIS — Z91018 Allergy to other foods: Secondary | ICD-10-CM

## 2023-01-17 DIAGNOSIS — J441 Chronic obstructive pulmonary disease with (acute) exacerbation: Secondary | ICD-10-CM | POA: Diagnosis present

## 2023-01-17 DIAGNOSIS — K219 Gastro-esophageal reflux disease without esophagitis: Secondary | ICD-10-CM | POA: Diagnosis present

## 2023-01-17 DIAGNOSIS — Z880 Allergy status to penicillin: Secondary | ICD-10-CM

## 2023-01-17 DIAGNOSIS — Z7952 Long term (current) use of systemic steroids: Secondary | ICD-10-CM | POA: Diagnosis not present

## 2023-01-17 DIAGNOSIS — I10 Essential (primary) hypertension: Secondary | ICD-10-CM | POA: Diagnosis present

## 2023-01-17 DIAGNOSIS — F32A Depression, unspecified: Secondary | ICD-10-CM | POA: Diagnosis present

## 2023-01-17 DIAGNOSIS — U071 COVID-19: Secondary | ICD-10-CM | POA: Diagnosis not present

## 2023-01-17 DIAGNOSIS — G8929 Other chronic pain: Secondary | ICD-10-CM | POA: Diagnosis present

## 2023-01-17 DIAGNOSIS — Z9981 Dependence on supplemental oxygen: Secondary | ICD-10-CM

## 2023-01-17 DIAGNOSIS — I82422 Acute embolism and thrombosis of left iliac vein: Secondary | ICD-10-CM

## 2023-01-17 DIAGNOSIS — E119 Type 2 diabetes mellitus without complications: Secondary | ICD-10-CM | POA: Diagnosis present

## 2023-01-17 DIAGNOSIS — Z7951 Long term (current) use of inhaled steroids: Secondary | ICD-10-CM | POA: Diagnosis not present

## 2023-01-17 DIAGNOSIS — B9561 Methicillin susceptible Staphylococcus aureus infection as the cause of diseases classified elsewhere: Secondary | ICD-10-CM | POA: Diagnosis present

## 2023-01-17 DIAGNOSIS — M545 Low back pain, unspecified: Secondary | ICD-10-CM | POA: Diagnosis not present

## 2023-01-17 DIAGNOSIS — J9610 Chronic respiratory failure, unspecified whether with hypoxia or hypercapnia: Secondary | ICD-10-CM | POA: Diagnosis present

## 2023-01-17 DIAGNOSIS — R7881 Bacteremia: Secondary | ICD-10-CM | POA: Diagnosis not present

## 2023-01-17 DIAGNOSIS — Z6834 Body mass index (BMI) 34.0-34.9, adult: Secondary | ICD-10-CM | POA: Diagnosis not present

## 2023-01-17 DIAGNOSIS — R911 Solitary pulmonary nodule: Secondary | ICD-10-CM | POA: Diagnosis present

## 2023-01-17 DIAGNOSIS — J4550 Severe persistent asthma, uncomplicated: Secondary | ICD-10-CM | POA: Diagnosis not present

## 2023-01-17 DIAGNOSIS — N50812 Left testicular pain: Secondary | ICD-10-CM

## 2023-01-17 DIAGNOSIS — Z8249 Family history of ischemic heart disease and other diseases of the circulatory system: Secondary | ICD-10-CM

## 2023-01-17 DIAGNOSIS — R9389 Abnormal findings on diagnostic imaging of other specified body structures: Secondary | ICD-10-CM | POA: Diagnosis not present

## 2023-01-17 DIAGNOSIS — Z5941 Food insecurity: Secondary | ICD-10-CM

## 2023-01-17 DIAGNOSIS — Z515 Encounter for palliative care: Secondary | ICD-10-CM | POA: Diagnosis not present

## 2023-01-17 DIAGNOSIS — M25552 Pain in left hip: Secondary | ICD-10-CM | POA: Diagnosis not present

## 2023-01-17 DIAGNOSIS — Z5986 Financial insecurity: Secondary | ICD-10-CM

## 2023-01-17 DIAGNOSIS — M51369 Other intervertebral disc degeneration, lumbar region without mention of lumbar back pain or lower extremity pain: Secondary | ICD-10-CM | POA: Diagnosis present

## 2023-01-17 DIAGNOSIS — M48061 Spinal stenosis, lumbar region without neurogenic claudication: Secondary | ICD-10-CM | POA: Diagnosis present

## 2023-01-17 DIAGNOSIS — M549 Dorsalgia, unspecified: Secondary | ICD-10-CM | POA: Diagnosis present

## 2023-01-17 LAB — CBC WITH DIFFERENTIAL/PLATELET
Abs Immature Granulocytes: 0.54 10*3/uL — ABNORMAL HIGH (ref 0.00–0.07)
Basophils Absolute: 0.1 10*3/uL (ref 0.0–0.1)
Basophils Relative: 0 %
Eosinophils Absolute: 0.1 10*3/uL (ref 0.0–0.5)
Eosinophils Relative: 0 %
HCT: 47.8 % (ref 39.0–52.0)
Hemoglobin: 15.2 g/dL (ref 13.0–17.0)
Immature Granulocytes: 2 %
Lymphocytes Relative: 5 %
Lymphs Abs: 1.6 10*3/uL (ref 0.7–4.0)
MCH: 26.6 pg (ref 26.0–34.0)
MCHC: 31.8 g/dL (ref 30.0–36.0)
MCV: 83.6 fL (ref 80.0–100.0)
Monocytes Absolute: 3 10*3/uL — ABNORMAL HIGH (ref 0.1–1.0)
Monocytes Relative: 10 %
Neutro Abs: 26 10*3/uL — ABNORMAL HIGH (ref 1.7–7.7)
Neutrophils Relative %: 83 %
Platelets: 409 10*3/uL — ABNORMAL HIGH (ref 150–400)
RBC: 5.72 MIL/uL (ref 4.22–5.81)
RDW: 18.9 % — ABNORMAL HIGH (ref 11.5–15.5)
Smear Review: NORMAL
WBC Morphology: INCREASED
WBC: 31.4 10*3/uL — ABNORMAL HIGH (ref 4.0–10.5)
nRBC: 0 % (ref 0.0–0.2)

## 2023-01-17 LAB — BASIC METABOLIC PANEL
Anion gap: 10 (ref 5–15)
BUN: 17 mg/dL (ref 6–20)
CO2: 31 mmol/L (ref 22–32)
Calcium: 9 mg/dL (ref 8.9–10.3)
Chloride: 95 mmol/L — ABNORMAL LOW (ref 98–111)
Creatinine, Ser: 0.98 mg/dL (ref 0.61–1.24)
GFR, Estimated: >60 mL/min (ref >60)
Glucose, Bld: 117 mg/dL — ABNORMAL HIGH (ref 70–99)
Potassium: 3.5 mmol/L (ref 3.5–5.1)
Sodium: 136 mmol/L (ref 135–145)

## 2023-01-17 LAB — PATHOLOGIST SMEAR REVIEW

## 2023-01-17 LAB — PROTIME-INR
INR: 1.1 (ref 0.8–1.2)
Prothrombin Time: 14.8 s (ref 11.4–15.2)

## 2023-01-17 LAB — LACTIC ACID, PLASMA
Lactic Acid, Venous: 2 mmol/L (ref 0.5–1.9)
Lactic Acid, Venous: 0.8 mmol/L (ref 0.5–1.9)

## 2023-01-17 LAB — HEPARIN LEVEL (UNFRACTIONATED): Heparin Unfractionated: 0.13 [IU]/mL — ABNORMAL LOW (ref 0.30–0.70)

## 2023-01-17 LAB — APTT: aPTT: 34 s (ref 24–36)

## 2023-01-17 MED ORDER — HEPARIN BOLUS VIA INFUSION
5000.0000 [IU] | Freq: Once | INTRAVENOUS | Status: AC
  Administered 2023-01-17: 5000 [IU] via INTRAVENOUS
  Filled 2023-01-17: qty 5000

## 2023-01-17 MED ORDER — ONDANSETRON HCL 4 MG/2ML IJ SOLN
4.0000 mg | Freq: Once | INTRAMUSCULAR | Status: AC
  Administered 2023-01-17: 4 mg via INTRAVENOUS
  Filled 2023-01-17: qty 2

## 2023-01-17 MED ORDER — MORPHINE SULFATE (PF) 4 MG/ML IV SOLN
4.0000 mg | INTRAVENOUS | Status: DC | PRN
  Administered 2023-01-17: 4 mg via INTRAVENOUS
  Filled 2023-01-17: qty 1

## 2023-01-17 MED ORDER — IPRATROPIUM-ALBUTEROL 0.5-2.5 (3) MG/3ML IN SOLN
3.0000 mL | Freq: Once | RESPIRATORY_TRACT | Status: AC
  Administered 2023-01-17: 3 mL via RESPIRATORY_TRACT
  Filled 2023-01-17: qty 3

## 2023-01-17 MED ORDER — HYDROCHLOROTHIAZIDE 25 MG PO TABS
25.0000 mg | ORAL_TABLET | Freq: Every day | ORAL | Status: DC
  Administered 2023-01-17 – 2023-01-25 (×8): 25 mg via ORAL
  Filled 2023-01-17 (×10): qty 1

## 2023-01-17 MED ORDER — HYDROMORPHONE HCL 1 MG/ML IJ SOLN
0.5000 mg | INTRAMUSCULAR | Status: DC | PRN
  Administered 2023-01-17: 0.5 mg via INTRAVENOUS
  Filled 2023-01-17: qty 0.5

## 2023-01-17 MED ORDER — SODIUM CHLORIDE 0.9 % IV SOLN
2.0000 g | Freq: Once | INTRAVENOUS | Status: AC
  Administered 2023-01-17: 2 g via INTRAVENOUS
  Filled 2023-01-17: qty 20

## 2023-01-17 MED ORDER — TIZANIDINE HCL 4 MG PO TABS
2.0000 mg | ORAL_TABLET | Freq: Three times a day (TID) | ORAL | Status: DC
  Administered 2023-01-17 – 2023-01-25 (×21): 2 mg via ORAL
  Filled 2023-01-17 (×23): qty 1

## 2023-01-17 MED ORDER — SODIUM CHLORIDE 0.9 % IV BOLUS
500.0000 mL | Freq: Once | INTRAVENOUS | Status: AC
  Administered 2023-01-17: 500 mL via INTRAVENOUS

## 2023-01-17 MED ORDER — PREDNISONE 20 MG PO TABS
40.0000 mg | ORAL_TABLET | Freq: Every day | ORAL | Status: DC
  Administered 2023-01-18 – 2023-01-19 (×2): 40 mg via ORAL
  Filled 2023-01-17 (×2): qty 2

## 2023-01-17 MED ORDER — MONTELUKAST SODIUM 10 MG PO TABS
10.0000 mg | ORAL_TABLET | Freq: Every day | ORAL | Status: DC
  Administered 2023-01-17 – 2023-01-24 (×8): 10 mg via ORAL
  Filled 2023-01-17 (×8): qty 1

## 2023-01-17 MED ORDER — ACETAMINOPHEN 650 MG RE SUPP: 650.0000 mg | Freq: Four times a day (QID) | RECTAL | Status: DC | PRN

## 2023-01-17 MED ORDER — ACETAMINOPHEN 325 MG PO TABS
650.0000 mg | ORAL_TABLET | Freq: Four times a day (QID) | ORAL | Status: DC | PRN
  Administered 2023-01-18: 650 mg via ORAL
  Filled 2023-01-17: qty 2

## 2023-01-17 MED ORDER — UMECLIDINIUM BROMIDE 62.5 MCG/ACT IN AEPB
1.0000 | INHALATION_SPRAY | Freq: Every day | RESPIRATORY_TRACT | Status: DC
  Administered 2023-01-19 – 2023-01-25 (×7): 1 via RESPIRATORY_TRACT
  Filled 2023-01-17 (×2): qty 7

## 2023-01-17 MED ORDER — GABAPENTIN 300 MG PO CAPS: 300.0000 mg | ORAL_CAPSULE | Freq: Three times a day (TID) | ORAL | Status: DC | PRN

## 2023-01-17 MED ORDER — ONDANSETRON HCL 4 MG/2ML IJ SOLN: 4.0000 mg | Freq: Four times a day (QID) | INTRAMUSCULAR | Status: DC | PRN

## 2023-01-17 MED ORDER — KETOROLAC TROMETHAMINE 30 MG/ML IJ SOLN
15.0000 mg | Freq: Once | INTRAMUSCULAR | Status: AC
  Administered 2023-01-17: 15 mg via INTRAVENOUS
  Filled 2023-01-17: qty 1

## 2023-01-17 MED ORDER — PANTOPRAZOLE SODIUM 40 MG PO TBEC
40.0000 mg | DELAYED_RELEASE_TABLET | Freq: Every day | ORAL | Status: DC
  Administered 2023-01-17 – 2023-01-25 (×7): 40 mg via ORAL
  Filled 2023-01-17 (×8): qty 1

## 2023-01-17 MED ORDER — METHYLPREDNISOLONE SODIUM SUCC 125 MG IJ SOLR
125.0000 mg | Freq: Once | INTRAMUSCULAR | Status: AC
  Administered 2023-01-17: 125 mg via INTRAVENOUS
  Filled 2023-01-17: qty 2

## 2023-01-17 MED ORDER — HYDROMORPHONE HCL 1 MG/ML IJ SOLN
0.5000 mg | INTRAMUSCULAR | Status: DC | PRN
  Administered 2023-01-17 – 2023-01-19 (×6): 0.5 mg via INTRAVENOUS
  Filled 2023-01-17 (×6): qty 0.5

## 2023-01-17 MED ORDER — OXYCODONE-ACETAMINOPHEN 5-325 MG PO TABS
1.0000 | ORAL_TABLET | Freq: Four times a day (QID) | ORAL | Status: DC | PRN
  Administered 2023-01-17 – 2023-01-19 (×5): 1 via ORAL
  Filled 2023-01-17 (×5): qty 1

## 2023-01-17 MED ORDER — VANCOMYCIN HCL 1750 MG/350ML IV SOLN
1750.0000 mg | Freq: Once | INTRAVENOUS | Status: AC
  Administered 2023-01-17: 1750 mg via INTRAVENOUS
  Filled 2023-01-17: qty 350

## 2023-01-17 MED ORDER — ONDANSETRON HCL 4 MG PO TABS: 4.0000 mg | ORAL_TABLET | Freq: Four times a day (QID) | ORAL | Status: DC | PRN

## 2023-01-17 MED ORDER — SENNOSIDES-DOCUSATE SODIUM 8.6-50 MG PO TABS
1.0000 | ORAL_TABLET | Freq: Every evening | ORAL | Status: DC | PRN
Start: 2023-01-17 — End: 2023-01-25

## 2023-01-17 MED ORDER — LOSARTAN POTASSIUM 50 MG PO TABS
100.0000 mg | ORAL_TABLET | Freq: Every day | ORAL | Status: DC
  Administered 2023-01-17 – 2023-01-25 (×7): 100 mg via ORAL
  Filled 2023-01-17 (×9): qty 2

## 2023-01-17 MED ORDER — HEPARIN (PORCINE) 25000 UT/250ML-% IV SOLN
1550.0000 [IU]/h | INTRAVENOUS | Status: DC
  Administered 2023-01-17: 1100 [IU]/h via INTRAVENOUS
  Administered 2023-01-18: 1350 [IU]/h via INTRAVENOUS
  Filled 2023-01-17 (×2): qty 250

## 2023-01-17 MED ORDER — IOHEXOL 300 MG/ML  SOLN
100.0000 mL | Freq: Once | INTRAMUSCULAR | Status: AC | PRN
  Administered 2023-01-17: 100 mL via INTRAVENOUS

## 2023-01-17 MED ORDER — IPRATROPIUM-ALBUTEROL 0.5-2.5 (3) MG/3ML IN SOLN
3.0000 mL | RESPIRATORY_TRACT | Status: DC | PRN
  Administered 2023-01-18: 3 mL via RESPIRATORY_TRACT

## 2023-01-17 MED ORDER — ENOXAPARIN SODIUM 40 MG/0.4ML IJ SOSY: 40.0000 mg | PREFILLED_SYRINGE | INTRAMUSCULAR | Status: DC
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
## 2023-01-17 NOTE — ED Notes (Signed)
Pt to CT

## 2023-01-17 NOTE — Consult Note (Signed)
ED Pharmacy Antibiotic Sign Off An antibiotic consult was received from an ED provider for vancomycin per pharmacy dosing for osteomyelitis. A chart review was completed to assess appropriateness.   The following one time order(s) were placed: Vancomycin 1750 mg   Further antibiotic and/or antibiotic pharmacy consults should be ordered by the admitting provider if indicated.   Thank you for allowing pharmacy to be a part of this patient's care.   Littie Deeds, PharmD Pharmacy Resident  01/17/2023 12:49 PM

## 2023-01-17 NOTE — Consult Note (Signed)
PHARMACY - ANTICOAGULATION CONSULT NOTE  Pharmacy Consult for Heparin  Indication: DVT  Allergies  Allergen Reactions   Penicillins Shortness Of Breath   Pork-Derived Products Nausea Only   Tomato Rash   Banana Hives   Coconut (Cocos Nucifera) Hives   Patient Measurements: Height: 5' (152.4 cm) Weight: 81.2 kg (179 lb) IBW/kg (Calculated) : 50 Heparin Dosing Weight: 68.1 kg   Vital Signs: Temp: 98.3 F (36.8 C) (12/04 1325) Temp Source: Oral (12/04 1325) BP: 127/78 (12/04 1300) Pulse Rate: 101 (12/04 1300)  Labs: Recent Labs    01/17/23 0917  HGB 15.2  HCT 47.8  PLT 409*  CREATININE 0.98   Estimated Creatinine Clearance: 85 mL/min (by C-G formula based on SCr of 0.98 mg/dL).  Medical History: Past Medical History:  Diagnosis Date   Asthma    COPD (chronic obstructive pulmonary disease) (HCC)    Eczema    Hypertension    Medications:  No PTA anticoagulation   Assessment: Joseph [name] is a 42 year old male that presented with hip and back pain. 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 From chart review, patient is not on any anticoagulation at home. Pharmacy has been consulted for initiation and management of a heparin infusion for a new DVT of the left illiac vein. Baseline labs: Hgb 15.2, PLT 409, aPTT and PT/INR ordered.   Goal of Therapy:  Heparin level 0.3-0.7 units/ml Monitor platelets by anticoagulation protocol: Yes   Plan:  Give bolus of 5000 units x 1  Start heparin infusion at 1100 units/hr  Check HL 6 hours after initiation of infusion  Monitor CBC daily while on heparin   Littie Deeds, PharmD Pharmacy Resident  01/17/2023 3:56 PM

## 2023-01-17 NOTE — ED Provider Notes (Signed)
[facility] Provider Note    Event Date/Time   First MD Initiated Contact with Patient 01/17/23 0901     (approximate)   History   Hip Pain and Back Pain   HPI  Joseph [name] is a 42 y.o. 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  Worsened with any movement.  Has had pain like this before including diagnosis of sciatica and feels similar to that.  No numbness or tingling and groin area.  He denies any lower extremity pain or swelling.     Physical Exam   Triage Vital Signs: ED Triage Vitals  Encounter Vitals Group     BP      Systolic BP Percentile      Diastolic BP Percentile      Pulse      Resp      Temp      Temp src      SpO2      Weight      Height      Head Circumference      Peak Flow      Pain Score      Pain Loc      Pain Education      Exclude from Growth Chart     Most recent vital signs: Vitals:   01/17/23 0930 01/17/23 1000  BP: 114/68 (!) 130/90  Pulse: (!) 108 (!) 114  Resp: (!) 21 19  Temp:    SpO2: 100% 98%     Constitutional: Alert  Eyes: Conjunctivae are normal.  Head: Atraumatic. Nose: No congestion/rhinnorhea. Mouth/Throat: Mucous membranes are moist.   Neck: Painless ROM.  Cardiovascular:   Good peripheral circulation. Respiratory: Prolonged expiratory phase with diffuse wheeze throughout but good overall air movement. Gastrointestinal: Soft and nontender.  Musculoskeletal: Tenderness palpation in the left paralumbar spinal area. Neurologic:  MAE spontaneously. No gross focal neurologic deficits are appreciated.  Skin:  Skin is warm, dry and intact. No rash noted. Psychiatric: Mood and affect are  normal. Speech and behavior are normal.    ED Results / Procedures / Treatments   Labs (all labs ordered are listed, but only abnormal results are displayed) Labs Reviewed  CBC WITH DIFFERENTIAL/PLATELET - Abnormal; Notable for the following components:      Result Value   WBC 31.4 (*)    RDW 18.9 (*)    Platelets 409 (*)    Neutro Abs 26.0 (*)    Monocytes Absolute 3.0 (*)    Abs Immature Granulocytes 0.54 (*)    All other components within normal limits  BASIC METABOLIC PANEL - Abnormal; Notable for the following components:   Chloride 95 (*)    Glucose, Bld 117 (*)    All other components within normal limits  CULTURE, BLOOD (ROUTINE X 2)  CULTURE, BLOOD (ROUTINE X 2)  PATHOLOGIST SMEAR REVIEW  LACTIC ACID, PLASMA  LACTIC ACID, PLASMA     EKG     RADIOLOGY Please see ED Course for my review and interpretation.  I personally reviewed all radiographic images ordered to evaluate for the above acute complaints and reviewed radiology reports and findings.  These findings were personally discussed  with the patient.  Please see medical record for radiology report.    PROCEDURES:  Critical Care performed: No  Procedures   MEDICATIONS ORDERED IN ED: Medications  morphine (PF) 4 MG/ML injection 4 mg (4 mg Intravenous Given 01/17/23 0928)  HYDROmorphone (DILAUDID) injection 0.5 mg (0.5 mg Intravenous Given 01/17/23 1005)  cefTRIAXone (ROCEPHIN) 2 g in sodium chloride 0.9 % 100 mL IVPB (has no administration in time range)  vancomycin (VANCOREADY) IVPB 1750 mg/350 mL (has no administration in time range)  ipratropium-albuterol (DUONEB) 0.5-2.5 (3) MG/3ML nebulizer solution 3 mL (3 mLs Nebulization Given 01/17/23 0926)  ondansetron (ZOFRAN) injection 4 mg (4 mg Intravenous Given 01/17/23 0927)  methylPREDNISolone sodium succinate (SOLU-MEDROL) 125 mg/2 mL injection 125 mg (125 mg Intravenous Given 01/17/23 0959)  ketorolac (TORADOL) 30 MG/ML injection 15 mg (15 mg  Intravenous Given 01/17/23 1005)  sodium chloride 0.9 % bolus 500 mL (500 mLs Intravenous New Bag/Given 01/17/23 1124)  iohexol (OMNIPAQUE) 300 MG/ML solution 100 mL (100 mLs Intravenous Contrast Given 01/17/23 1211)     IMPRESSION / MDM / ASSESSMENT AND PLAN / ED COURSE  I reviewed the triage vital signs and the nursing notes.                              Differential diagnosis includes, but is not limited to, pneumonia, COPD, asthma, bronchitis, pyelonephritis, colitis, diverticulitis, discitis, septic arthritis, musculoskeletal strain  Patient presenting to the ER for evaluation of symptoms as described above.  Based on symptoms, risk factors and considered above differential, this presenting complaint could reflect a potentially life-threatening illness therefore the patient will be placed on continuous pulse oximetry and telemetry for monitoring.  Laboratory evaluation will be sent to evaluate for the above complaints.      Clinical Course as of 01/17/23 1305  Wed Jan 17, 2023  1478 Chest x-ray on my review and interpretation without evidence of consolidation or pneumothorax. [PR]  1240 CT imaging with findings concerning for discitis lumbar spine osteomyelitis also on the differential.  Have added on lactate.  MRI has been ordered. [PR]  1249 I will consult hospitalist for admission.  I have added on lactate and cultures I will order broad-spectrum antibiotics given leukocytosis and persistent tachycardia. [PR]    Clinical Course User Index [PR] Willy Eddy, MD     FINAL CLINICAL IMPRESSION(S) / ED DIAGNOSES   Final diagnoses:  Acute midline low back pain without sciatica  SIRS (systemic inflammatory response syndrome) (HCC)     Rx / DC Orders   ED Discharge Orders     None        Note:  This document was prepared using Dragon voice recognition software and may include unintentional dictation errors.    Willy Eddy, MD 01/17/23 [phone]

## 2023-01-17 NOTE — ED Notes (Signed)
Pt. Laying in bed, conversational with staff. Provided urinal for bedside, per request, denies further need.

## 2023-01-17 NOTE — H&P (Addendum)
History and Physical    [name] UJW:[id number] DOB: 1980/03/30 DOA: 01/17/2023 DOS: the patient was seen and examined on 01/17/2023 PCP: Wilford Corner, [name]  Patient coming from: Home  Chief Complaint:  Chief Complaint  Patient presents with   Hip Pain   Back Pain   HPI: Joseph [name] is a 42 y.o. 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 Patient reports that he had a left labrum tear 2 years ago and a oral accident and has been followed by orthopedic surgery at [facility]. He has follow-up with them scheduled for tomorrow. His back pain has been worse since yesterday, and when he found out that his white count was elevated on the lab by his PCP yesterday, he presented to the ED for further evaluation.  The back pain is worse with any movement and laying on his back. He denies any fevers, chills, IVDU, numbness or tingling, lower extremity pain, bladder or bowel incontinence.  ED course: Initial vitals with temp 98.2, RR 19, HR 114, BP 130/90, SpO2 98% on 2 L [place]. Labs show WBC 31.4 (from 22.3 10 months ago), Hgb 15.4, platelet 409, K+ 3.5, creatinine 0.98, lactic acid 2.0-0.8.  CXR no active disease Lumbar x-ray showed multilevel degenerative changes of the L-spine, worse at L3-4 X-ray of the hip show findings concerning for calcific tendinitis but no acute osseous abnormality CT A/P negative for acute intra-abdominal abnormality but showed nonspecific soft tissue stranding in the prevertebral space of the L4-L5 and L5-S1 concerning for possible discitis/osteomyelitis.  MRI of spine recommended. Patient started on IV vancomycin and Rocephin for potential osteomyelitis. Received IV Toradol, IV morphine and Dilaudid for pain. Received IV Solu-Medrol and  DuoNeb for COPD/Asthma exacerbation [facility] was consulted for admission   Review of Systems: As mentioned in the history of present illness. All other systems reviewed and are negative. Past Medical History:  Diagnosis Date   Asthma    COPD (chronic obstructive pulmonary disease) (HCC)    Eczema    Hypertension    Past Surgical History:  Procedure Laterality Date   OLECRANON BURSECTOMY Right 03/16/2022   Procedure: I&D OLECRANON (ELBOW) BURSA;  Surgeon: Signa Kell, MD;  Location: [facility] ORS;  Service: Orthopedics;  Laterality: Right;   Social History:  reports that he has been smoking cigarettes. He has never used smokeless tobacco. He reports current drug use. Drug: Marijuana. He reports that he does not drink alcohol.  Allergies  Allergen Reactions   Penicillins Shortness Of Breath   Pork-Derived Products Nausea Only   Tomato Rash   Banana Hives   Coconut (Cocos Nucifera) Hives    Family History  Problem Relation Age of Onset   Hypertension Mother     Prior to Admission medications   Medication Sig Start Date End Date Taking? Authorizing Provider  albuterol (PROVENTIL HFA;VENTOLIN HFA) 108 (90 Base) MCG/ACT inhaler Inhale 2 puffs into the lungs every 6 (six) hours as needed for wheezing or shortness of breath.   Yes [provider]  albuterol (PROVENTIL) (2.5 MG/3ML) 0.083% nebulizer solution Take 2.5 mg by nebulization every 6 (six) hours as needed for wheezing or shortness of breath.   Yes [provider]  dupilumab (DUPIXENT) 300 MG/2ML prefilled syringe Inject 300 mg into the skin every 14 (fourteen)  days.   Yes [provider]  furosemide (LASIX) 20 MG tablet Take 20 mg by mouth daily as needed for edema or fluid.   Yes [provider]  gabapentin (NEURONTIN) 300 MG capsule Take 300 mg by mouth 3 (three) times daily as needed (Neuropathy pain). 03/21/22  Yes [provider]  hydrochlorothiazide (HYDRODIURIL) 25 MG tablet Take 25 mg  by mouth daily. 09/24/20  Yes [provider]  ibuprofen (ADVIL) 800 MG tablet Take 800 mg by mouth daily as needed for fever, headache, mild pain, moderate pain or cramping. 12/19/21  Yes [provider]  INCRUSE ELLIPTA 62.5 MCG/ACT AEPB Inhale 1 puff into the lungs daily. 07/04/22  Yes [provider]  losartan (COZAAR) 100 MG tablet Take 1 tablet (100 mg total) by mouth daily. 09/03/21  Yes Wouk, Wilfred Curtis, MD  montelukast (SINGULAIR) 10 MG tablet Take 10 mg by mouth at bedtime.   Yes [provider]  omeprazole (PRILOSEC) 20 MG capsule Take 20 mg by mouth daily as needed (GERD symptoms).   Yes [provider]  predniSONE (DELTASONE) 2.5 MG tablet Take 7.5 mg by mouth daily.   Yes [provider]  tiZANidine (ZANAFLEX) 2 MG tablet Take 2 mg by mouth 3 (three) times daily.   Yes [provider]  fluticasone-salmeterol (ADVAIR HFA) 230-21 MCG/ACT inhaler Inhale 2 puffs into the lungs every 12 (twelve) hours. Patient not taking: Reported on 01/17/2023    [provider]  HYDROcodone-acetaminophen (NORCO/VICODIN) 5-325 MG tablet Take 1-2 tablets by mouth every 4 (four) hours as needed for moderate pain or severe pain. Patient not taking: Reported on 01/17/2023 03/17/22   Dedra Skeens, [name]  tobramycin (TOBREX) 0.3 % ophthalmic solution Place 1-2 drops into the right eye in the morning, at noon, in the evening, and at bedtime. Patient not taking: Reported on 01/17/2023 08/27/21   [provider]    Physical Exam: Vitals:   01/17/23 1230 01/17/23 1300 01/17/23 1325 01/17/23 1430  BP: 125/82 127/78  127/89  Pulse: (!) 105 (!) 101  97  Resp:      Temp:   98.3 F (36.8 C)   TempSrc:   Oral   SpO2: 97% 99%  100%  Weight:      Height:       General: Pleasant, well-appearing middle-age man laying on left side in bed. No acute distress. HEENT: [place]/AT. Anicteric sclera. CV: Tachycardic.  Regular rhythm. No murmurs, rubs, or  gallops. No LE edema Pulmonary: Lungs CTAB. Normal effort. Mild expiratory wheezes throughout.  No rales or rhonchi. Abdominal: Soft, nontender, nondistended. Normal bowel sounds. MSK: Moderate tenderness palpation of the L-spine and left hip. Limited ROM of the back Extremities: Palpable radial and DP pulses. Normal ROM. Skin: Warm and dry. No obvious rash or lesions. Neuro: A&Ox3. Moves all extremities. Normal sensation to light touch. No focal deficit. Psych: Normal mood and affect  Data Reviewed: Labs show WBC 31.4 (from 22.3 10 months ago), Hgb 15.4, platelet 409, K+ 3.5, creatinine 0.98, lactic acid 2.0-0.8.  CXR no active disease Lumbar x-ray showed multilevel degenerative changes of the L-spine, worse at L3-4 X-ray of the hip show findings concerning for calcific tendinitis but no acute osseous abnormality CT A/P negative for acute intra-abdominal abnormality but showed nonspecific soft tissue stranding in the prevertebral space of the L4-L5 and L5-S1 concerning for possible discitis/osteomyelitis.  MRI of spine recommended.  MRI L-spine showed DVT of the left iliac vein with large clot burden  and DDD with moderate spinal stenosis.   Assessment and Plan: Joseph [name] is a 41 y.o. 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  # Left iliac DVT [place] patient with history of chronic left hip pain secondary to labrum tear 2 years ago that has limited his mobility found to have DVT of the left iliac vein with significant clot burden.  Denies any long plane rides but reports periods of immobility. Patient with tenderness to palpation of the left hip but neurovascularly intact distally.  Reports some leg pain but no LE edema. -Vascular surgery consulted,  appreciate recs -Heparin for DVT per pharmacy -Follow-up BLE DVT study -Pain control with Percocet and IV Dilaudid  # Low back pain # Left AS labral tear # Spinal stenosis Patient with a history of chronic low back pain followed by [facility] orthopedic surgery now presenting with worsening low back pain. X-ray of the low back shows multilevel degenerative changes of the L-spine, worse at L3-4. MRI L-spine shows moderate L3-4 and L4-5 disc bulging with associated moderate stenosis.  Patient with moderate tenderness palpation of the L-spine. -Orthopedic surgery consulted, appreciate recs -Percocet 5-325 mg q6h prn for moderate pain -IV Dilaudid 0.5 mg q4h prn for severe pain -Tizanidine 2 mg 3 times daily -Gabapentin 300 mg 3 times daily as needed for neuropathic pain  # COPD/asthma exacerbation Patient with persistent asthma and COPD on chronic steroids and chronic 2 L [place] at home presented with worsening shortness of breath. Lung auscultation reveals diffuse aspiratory wheezes.  Status post IV Solu-Medrol and DuoNeb x 1 in the ED. -Continue supplemental oxygen -Prednisone 40 mg daily starting tomorrow -Resume home Incruse Ellipta and Singulair -As needed DuoNebs for SOB and wheezing -Continue Dupixent every 2 weeks  # Leukocytosis Patient found to have WBC of 31 on admission. Patient with chronic leukocytosis dating back to 2019 in the setting of chronic steroid use. Initial concern for osteomyelitis of the spine currently ruled out with MRI L-spine. He has no fevers or infectious symptoms. -Discontinue antibiotics -Trend CBC, fever curve  # HTN BP stable with SBP in the 110s to 130s. -Resume home HCTZ and losartan  # GERD -Protonix 40 mg daily   Advance Care Planning:   Code Status: Full Code   Consults: Vascular surgery, orthopedic surgery  Family Communication: Discussed admission with spouse at bedside  Severity of Illness: The appropriate patient status for this patient  is INPATIENT. Inpatient status is judged to be reasonable and necessary in order to provide the required intensity of service to ensure the patient's safety. The patient's presenting symptoms, physical exam findings, and initial radiographic and laboratory data in the context of their chronic comorbidities is felt to place them at high risk for further clinical deterioration. Furthermore, it is not anticipated that the patient will be medically stable for discharge from the hospital within 2 midnights of admission.   * I certify that at the point of admission it is my clinical judgment that the patient will require inpatient hospital care spanning beyond 2 midnights from the point of admission due to high intensity of service, high risk for further deterioration and high frequency of surveillance required.*  Author: [name], MD 01/17/2023 5:03 PM  For on call review  [email or web address]

## 2023-01-18 ENCOUNTER — Inpatient Hospital Stay: Payer: Medicare Other

## 2023-01-18 ENCOUNTER — Encounter
Admission: EM | Disposition: A | Discharge status: Home Health | Payer: Self-pay | Source: Home / Self Care | Attending: Internal Medicine

## 2023-01-18 ENCOUNTER — Encounter: Payer: Self-pay | Admitting: Student

## 2023-01-18 DIAGNOSIS — I82422 Acute embolism and thrombosis of left iliac vein: Secondary | ICD-10-CM | POA: Diagnosis not present

## 2023-01-18 DIAGNOSIS — M545 Low back pain, unspecified: Secondary | ICD-10-CM | POA: Diagnosis not present

## 2023-01-18 DIAGNOSIS — R9389 Abnormal findings on diagnostic imaging of other specified body structures: Secondary | ICD-10-CM | POA: Diagnosis not present

## 2023-01-18 DIAGNOSIS — M25552 Pain in left hip: Secondary | ICD-10-CM

## 2023-01-18 DIAGNOSIS — R7881 Bacteremia: Secondary | ICD-10-CM | POA: Diagnosis not present

## 2023-01-18 DIAGNOSIS — B9561 Methicillin susceptible Staphylococcus aureus infection as the cause of diseases classified elsewhere: Secondary | ICD-10-CM | POA: Diagnosis not present

## 2023-01-18 HISTORY — PX: ABDOMINAL AORTOGRAM W/LOWER EXTREMITY: CATH118223

## 2023-01-18 LAB — CBC
HCT: 37.5 % — ABNORMAL LOW (ref 39.0–52.0)
Hemoglobin: 12.1 g/dL — ABNORMAL LOW (ref 13.0–17.0)
MCH: 26.7 pg (ref 26.0–34.0)
MCHC: 32.3 g/dL (ref 30.0–36.0)
MCV: 82.8 fL (ref 80.0–100.0)
Platelets: 343 10*3/uL (ref 150–400)
RBC: 4.53 MIL/uL (ref 4.22–5.81)
RDW: 18 % — ABNORMAL HIGH (ref 11.5–15.5)
WBC: 32.4 10*3/uL — ABNORMAL HIGH (ref 4.0–10.5)
nRBC: 0 % (ref 0.0–0.2)

## 2023-01-18 LAB — BASIC METABOLIC PANEL
Anion gap: 8 (ref 5–15)
BUN: 24 mg/dL — ABNORMAL HIGH (ref 6–20)
CO2: 28 mmol/L (ref 22–32)
Calcium: 8 mg/dL — ABNORMAL LOW (ref 8.9–10.3)
Chloride: 99 mmol/L (ref 98–111)
Creatinine, Ser: 0.82 mg/dL (ref 0.61–1.24)
GFR, Estimated: >60 mL/min (ref >60)
Glucose, Bld: 121 mg/dL — ABNORMAL HIGH (ref 70–99)
Potassium: 3.7 mmol/L (ref 3.5–5.1)
Sodium: 135 mmol/L (ref 135–145)

## 2023-01-18 LAB — HIV ANTIBODY (ROUTINE TESTING W REFLEX): HIV Screen 4th Generation wRfx: NONREACTIVE

## 2023-01-18 LAB — BLOOD CULTURE ID PANEL (REFLEXED) - BCID2

## 2023-01-18 LAB — HEMOGLOBIN A1C
Hgb A1c MFr Bld: 6.8 % — ABNORMAL HIGH (ref 4.8–5.6)
Mean Plasma Glucose: 148.46 mg/dL

## 2023-01-18 LAB — HEPARIN LEVEL (UNFRACTIONATED)
Heparin Unfractionated: <0.1 [IU]/mL — ABNORMAL LOW (ref 0.30–0.70)
Heparin Unfractionated: <0.1 [IU]/mL — ABNORMAL LOW (ref 0.30–0.70)

## 2023-01-18 SURGERY — ABDOMINAL AORTOGRAM W/LOWER EXTREMITY
Anesthesia: Moderate Sedation | Laterality: Left

## 2023-01-18 MED ORDER — SODIUM CHLORIDE 0.9 % IV SOLN: INTRAVENOUS | Status: DC

## 2023-01-18 MED ORDER — CEFAZOLIN SODIUM-DEXTROSE 2-4 GM/100ML-% IV SOLN
INTRAVENOUS | Status: AC
  Filled 2023-01-18: qty 100

## 2023-01-18 MED ORDER — FENTANYL CITRATE (PF) 100 MCG/2ML IJ SOLN
INTRAMUSCULAR | Status: DC | PRN
  Administered 2023-01-18: 50 ug via INTRAVENOUS

## 2023-01-18 MED ORDER — MIDAZOLAM HCL 2 MG/2ML IJ SOLN
INTRAMUSCULAR | Status: DC | PRN
  Administered 2023-01-18: 2 mg via INTRAVENOUS

## 2023-01-18 MED ORDER — VANCOMYCIN HCL IN DEXTROSE 1-5 GM/200ML-% IV SOLN
INTRAVENOUS | Status: AC
  Filled 2023-01-18: qty 200

## 2023-01-18 MED ORDER — VANCOMYCIN HCL IN DEXTROSE 1-5 GM/200ML-% IV SOLN
1000.0000 mg | INTRAVENOUS | Status: AC
  Administered 2023-01-18: 1000 mg via INTRAVENOUS

## 2023-01-18 MED ORDER — GADOBUTROL 1 MMOL/ML IV SOLN
8.0000 mL | Freq: Once | INTRAVENOUS | Status: AC | PRN
  Administered 2023-01-18: 8 mL via INTRAVENOUS

## 2023-01-18 MED ORDER — FAMOTIDINE 20 MG PO TABS: 40.0000 mg | ORAL_TABLET | Freq: Once | ORAL | Status: DC | PRN

## 2023-01-18 MED ORDER — HEPARIN (PORCINE) IN NACL 2000-0.9 UNIT/L-% IV SOLN
INTRAVENOUS | Status: DC | PRN
  Administered 2023-01-18: 1000 mL

## 2023-01-18 MED ORDER — HEPARIN BOLUS VIA INFUSION
2000.0000 [IU] | Freq: Once | INTRAVENOUS | Status: AC
  Administered 2023-01-18: 2000 [IU] via INTRAVENOUS
  Filled 2023-01-18: qty 2000

## 2023-01-18 MED ORDER — IPRATROPIUM-ALBUTEROL 0.5-2.5 (3) MG/3ML IN SOLN
RESPIRATORY_TRACT | Status: AC
  Filled 2023-01-18: qty 3

## 2023-01-18 MED ORDER — IODIXANOL 320 MG/ML IV SOLN
INTRAVENOUS | Status: DC | PRN
  Administered 2023-01-18: 15 mL via INTRAVENOUS

## 2023-01-18 MED ORDER — DIPHENHYDRAMINE HCL 50 MG/ML IJ SOLN: 50.0000 mg | Freq: Once | INTRAMUSCULAR | Status: DC | PRN

## 2023-01-18 MED ORDER — METHYLPREDNISOLONE SODIUM SUCC 125 MG IJ SOLR: 125.0000 mg | Freq: Once | INTRAMUSCULAR | Status: DC | PRN

## 2023-01-18 MED ORDER — HEPARIN SODIUM (PORCINE) 1000 UNIT/ML IJ SOLN
INTRAMUSCULAR | Status: AC
  Filled 2023-01-18: qty 10

## 2023-01-18 MED ORDER — FENTANYL CITRATE (PF) 100 MCG/2ML IJ SOLN
INTRAMUSCULAR | Status: AC
  Filled 2023-01-18: qty 2

## 2023-01-18 MED ORDER — LIDOCAINE HCL (PF) 1 % IJ SOLN
INTRAMUSCULAR | Status: DC | PRN
  Administered 2023-01-18: 10 mL

## 2023-01-18 MED ORDER — MIDAZOLAM HCL 5 MG/5ML IJ SOLN
INTRAMUSCULAR | Status: AC
  Filled 2023-01-18: qty 5

## 2023-01-18 MED ORDER — IPRATROPIUM-ALBUTEROL 0.5-2.5 (3) MG/3ML IN SOLN: 3.0000 mL | Freq: Four times a day (QID) | RESPIRATORY_TRACT | Status: DC

## 2023-01-18 MED ORDER — CEFAZOLIN SODIUM-DEXTROSE 2-4 GM/100ML-% IV SOLN
2.0000 g | Freq: Three times a day (TID) | INTRAVENOUS | Status: DC
  Administered 2023-01-18 – 2023-01-25 (×23): 2 g via INTRAVENOUS
  Filled 2023-01-18 (×22): qty 100

## 2023-01-18 MED ORDER — ALBUTEROL SULFATE (2.5 MG/3ML) 0.083% IN NEBU
2.5000 mg | INHALATION_SOLUTION | RESPIRATORY_TRACT | Status: DC | PRN
  Administered 2023-01-19 – 2023-01-23 (×3): 2.5 mg via RESPIRATORY_TRACT
  Filled 2023-01-18 (×3): qty 3

## 2023-01-18 MED ORDER — MIDAZOLAM HCL 2 MG/ML PO SYRP: 8.0000 mg | ORAL_SOLUTION | Freq: Once | ORAL | Status: DC | PRN

## 2023-01-18 SURGICAL SUPPLY — 9 items
CLOSURE PERCLOSE PROSTYLE (VASCULAR PRODUCTS) IMPLANT
COVER PROBE ULTRASOUND 5X96 (MISCELLANEOUS) IMPLANT
NDL ENTRY 21GA 7CM ECHOTIP (NEEDLE) IMPLANT
NEEDLE ENTRY 21GA 7CM ECHOTIP (NEEDLE) ×1 IMPLANT
SET INTRO CAPELLA COAXIAL (SET/KITS/TRAYS/PACK) IMPLANT
SHEATH BRITE TIP 6FRX11 (SHEATH) IMPLANT
SHEATH INTRO CHECKFLO 16F 13 (SHEATH) IMPLANT
SUT MNCRL AB 4-0 PS2 18 (SUTURE) IMPLANT
WIRE GUIDERIGHT .035X150 (WIRE) IMPLANT

## 2023-01-18 NOTE — Consult Note (Signed)
NAME: Joseph [name]  DOB: 09/10/1980  MRN: [id number]  Date/Time: 01/18/2023 3:00 PM  REQUESTING PROVIDER; Dr.Yates Subjective:  REASON FOR CONSULT: MSSA bacteremia ? Joseph [name] is a 42 y.o.male  with a history of  history of severe persistent asthma COPD overlap syndrome, steroid-dependent, on dupulimab, allergic rhinitis, elevated IgE, eczema ,nodular infiltrate lungs in 2019, MRSA rt olecranon bursitis in Feb 2024 presents with worsening low back pain . Pain had fallen on the bedroom floor and could not pick himself up and EMS was called and he was brought to the ED . 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 Blood culture came positive for Staph bacteremia and I am seeing the patient for the same Pt is disabled and does not work   Past Medical History:  Diagnosis Date   Asthma    COPD (chronic obstructive pulmonary disease) (HCC)    Eczema    Hypertension     Past Surgical History:  Procedure Laterality Date   OLECRANON BURSECTOMY Right 03/16/2022   Procedure: I&D OLECRANON (ELBOW) BURSA;  Surgeon: Signa Kell, MD;  Location: [facility] ORS;  Service: Orthopedics;  Laterality: Right;    Social History   Socioeconomic History   Marital status: Married    Spouse name: Not on file   Number of children: Not on file   Years of education: Not on file   Highest education level: Not on file  Occupational History   Not on file  Tobacco Use   Smoking  status: Every Day    Current packs/day: 0.50    Types: Cigarettes   Smokeless tobacco: Never  Substance and Sexual Activity   Alcohol use: No   Drug use: Yes    Types: Marijuana   Sexual activity: Not on file  Other Topics Concern   Not on file  Social History Narrative   Not on file   Social Determinants of Health   Financial Resource Strain: Medium Risk (09/22/2022)   Received from [facility]   Overall Financial Resource Strain (CARDIA)    Difficulty of Paying Living Expenses: Somewhat hard  Food Insecurity: No Food Insecurity (09/22/2022)   Received from [facility]   Hunger Vital Sign    Worried About Running Out of Food in the Last Year: Never true    Ran Out of Food in the Last Year: Never true  Recent Concern: Food Insecurity - Food Insecurity Present (07/08/2022)   Received from [facility]   Hunger Vital Sign    Worried About Running Out of Food in the Last Year: Sometimes true    Ran Out of Food in the Last Year: Sometimes true  Transportation Needs: No Transportation Needs (09/22/2022)   Received from [facility] - Transportation    In the past  12 months, has lack of transportation kept you from medical appointments or from getting medications?: No    Lack of Transportation (Non-Medical): No  Recent Concern: Transportation Needs - Unmet Transportation Needs (07/08/2022)   Received from [facility] - Transportation    In the past 12 months, has lack of transportation kept you from medical appointments or from getting medications?: Yes    Lack of Transportation (Non-Medical): No  Physical Activity: Not on file  Stress: Not on file  Social Connections: Not on file  Intimate Partner Violence: Not At Risk (03/16/2022)   Humiliation, Afraid, Rape, and Kick questionnaire    Fear of Current or Ex-Partner: No    Emotionally Abused: No    Physically Abused: No     Sexually Abused: No    Family History  Problem Relation Age of Onset   Hypertension Mother    Allergies  Allergen Reactions   Penicillins Shortness Of Breath   Pork-Derived Products Nausea Only   Tomato Rash   Banana Hives   Coconut (Cocos Nucifera) Hives   I? Current Facility-Administered Medications  Medication Dose Route Frequency Provider Last Rate Last Admin   acetaminophen (TYLENOL) tablet 650 mg  650 mg Oral Q6H PRN [name], MD       Or   acetaminophen (TYLENOL) suppository 650 mg  650 mg Rectal Q6H PRN [name], MD       ceFAZolin (ANCEF) IVPB 2g/100 mL premix  2 g Intravenous Bobette Mo, MD   Stopped at 01/18/23 1429   gabapentin (NEURONTIN) capsule 300 mg  300 mg Oral TID PRN [name], MD       heparin ADULT infusion 100 units/mL (25000 units/274mL)  1,550 Units/hr Intravenous Continuous Merryl Hacker, RPH   Stopped at 01/18/23 1320   hydrochlorothiazide (HYDRODIURIL) tablet 25 mg  25 mg Oral Daily [name], MD   25 mg at 01/17/23 2022   HYDROmorphone (DILAUDID) injection 0.5 mg  0.5 mg Intravenous Q4H PRN [name], MD   0.5 mg at 01/18/23 1610   ipratropium-albuterol (DUONEB) 0.5-2.5 (3) MG/3ML nebulizer solution 3 mL  3 mL Nebulization Q4H PRN [name], MD   3 mL at 01/18/23 1010   losartan (COZAAR) tablet 100 mg  100 mg Oral Daily [name], MD   100 mg at 01/17/23 2022   montelukast (SINGULAIR) tablet 10 mg  10 mg Oral QHS [name], MD   10 mg at 01/17/23 2305   ondansetron (ZOFRAN) tablet 4 mg  4 mg Oral Q6H PRN [name], MD       Or   ondansetron (ZOFRAN) injection 4 mg  4 mg Intravenous Q6H PRN [name], MD       oxyCODONE-acetaminophen (PERCOCET/ROXICET) 5-325 MG per tablet 1 tablet  1 tablet Oral Q6H PRN [name], MD   1 tablet at 01/18/23 0745   pantoprazole (PROTONIX) EC tablet 40 mg  40 mg Oral Daily [name], MD   40 mg  at 01/17/23 2021   predniSONE (DELTASONE) tablet 40 mg  40 mg Oral Q breakfast [name], MD   40 mg at 01/18/23 0745   senna-docusate (Senokot-S) tablet 1 tablet  1 tablet Oral QHS PRN [name], MD       tiZANidine (ZANAFLEX) tablet 2 mg  2 mg Oral TID [name], MD   2 mg at 01/17/23 2305  umeclidinium bromide (INCRUSE ELLIPTA) 62.5 MCG/ACT 1 puff  1 puff Inhalation Daily [name], MD         Abtx:  Anti-infectives (From admission, onward)    Start     Dose/Rate Route Frequency Ordered Stop   01/18/23 0917  vancomycin (VANCOCIN) IVPB 1000 mg/200 mL premix        1,000 mg 200 mL/hr over 60 Minutes Intravenous 60 min pre-op 01/18/23 0917 01/18/23 1428   01/18/23 0900  ceFAZolin (ANCEF) IVPB 2g/100 mL premix        2 g 200 mL/hr over 30 Minutes Intravenous Every 8 hours 01/18/23 0721     01/17/23 1300  vancomycin (VANCOREADY) IVPB 1750 mg/350 mL        1,750 mg 175 mL/hr over 120 Minutes Intravenous  Once 01/17/23 1248 01/17/23 1715   01/17/23 1245  cefTRIAXone (ROCEPHIN) 2 g in sodium chloride 0.9 % 100 mL IVPB        2 g 200 mL/hr over 30 Minutes Intravenous  Once 01/17/23 1242 01/17/23 1432       REVIEW OF SYSTEMS:  Const: negative fever, chills, negative weight loss Eyes: negative diplopia or visual changes, negative eye pain ENT: negative coryza, negative sore throat Resp: negative cough, hemoptysis, has dyspnea Cards: negative for chest pain, palpitations, lower extremity edema GU: negative for frequency, dysuria and hematuria GI: Negative for abdominal pain, diarrhea, bleeding, constipation Skin: negative for rash and pruritus Heme: negative for easy bruising and gum/nose bleeding MS:  weakness Neurolo:negative for headaches, dizziness, vertigo, memory problems  Psych:  anxiety, depression  Endocrine: negative for thyroid, diabetes Allergy/Immunology- PCN sob Objective:  VITALS:  BP 105/71 (BP Location: Left Arm)   Pulse  (!) 103   Temp 97.8 F (36.6 C)   Resp 18   Ht 5' (1.524 m)   Wt 81.2 kg   SpO2 98%   BMI 34.96 kg/m   PHYSICAL EXAM:  General: Alert, cooperative, moon faced Head: Normocephalic, without obvious abnormality, atraumatic. Eyes: Conjunctivae clear, anicteric sclerae. Pupils are equal ENT Nares normal. No drainage or sinus tenderness. Tongue whitish spots Neck: Supple, symmetrical, no adenopathy, thyroid: non tender no carotid bruit and no JVD. Back: No CVA tenderness. Lungs: b/l rhonchi Heart: Regular rate and rhythm, no murmur, rub or gallop.obese striae non-tender,not distended. Bowel sounds normal. No masses Extremities: atraumatic, no cyanosis. No edema. No clubbing Skin: No rashes or lesions. Or bruising Lymph: Cervical, supraclavicular normal. Neurologic: Grossly non-focal Pertinent Labs Lab Results CBC    Component Value Date/Time   WBC 32.4 (H) 01/18/2023 0505   RBC 4.53 01/18/2023 0505   HGB 12.1 (L) 01/18/2023 0505   HCT 37.5 (L) 01/18/2023 0505   PLT 343 01/18/2023 0505   MCV 82.8 01/18/2023 0505   MCH 26.7 01/18/2023 0505   MCHC 32.3 01/18/2023 0505   RDW 18.0 (H) 01/18/2023 0505   LYMPHSABS 1.6 01/17/2023 0917   MONOABS 3.0 (H) 01/17/2023 0917   EOSABS 0.1 01/17/2023 0917   BASOSABS 0.1 01/17/2023 0917       Latest Ref Rng & Units 01/18/2023    5:05 AM 01/17/2023    9:17 AM 03/18/2022    6:19 AM  CMP  Glucose 70 - 99 mg/dL 536  644    BUN 6 - 20 mg/dL 24  17    Creatinine 0.34 - 1.24 mg/dL 7.42  5.95  6.38   Sodium 135 - 145 mmol/L 135  136    Potassium 3.5 - 5.1 mmol/L  3.7  3.5    Chloride 98 - 111 mmol/L 99  95    CO2 22 - 32 mmol/L 28  31    Calcium 8.9 - 10.3 mg/dL 8.0  9.0        Microbiology: Recent Results (from the past 240 hour(s))  Blood Culture (routine x 2)     Status: None (Preliminary result)   Collection Time: 01/17/23  1:41 PM   Specimen: BLOOD  Result Value Ref Range Status   Specimen Description BLOOD BLOOD LEFT HAND   Final   Special Requests   Final    BOTTLES DRAWN AEROBIC AND ANAEROBIC Blood Culture adequate volume   Culture  Setup Time   Final    GRAM POSITIVE COCCI IN BOTH AEROBIC AND ANAEROBIC BOTTLES CRITICAL RESULT CALLED TO, READ BACK BY AND VERIFIED WITHGloriann Loan AT 8119 01/18/23 JG Performed at [facility], [address] [place], [place] [address]    Culture GRAM POSITIVE COCCI  Final   Report Status PENDING  Incomplete  Blood Culture (routine x 2)     Status: None (Preliminary result)   Collection Time: 01/17/23  1:41 PM   Specimen: BLOOD  Result Value Ref Range Status   Specimen Description   Final    BLOOD BLOOD LEFT FOREARM Performed at [facility], [address] [place], [place] [address]    Special Requests   Final    BOTTLES DRAWN AEROBIC AND ANAEROBIC Blood Culture adequate volume Performed at [facility], [address] [place], [place] [address]    Culture  Setup Time   Final    GRAM POSITIVE COCCI IN BOTH AEROBIC AND ANAEROBIC BOTTLES CRITICAL RESULT CALLED TO, READ BACK BY AND VERIFIED WITH:  Gloriann Loan AT 7846 01/18/23 JG Performed at [facility], [address] [place], [place] [address]    Culture GRAM POSITIVE COCCI  Final   Report Status PENDING  Incomplete  Blood Culture ID Panel (Reflexed)     Status: Abnormal   Collection Time: 01/17/23  1:41 PM  Result Value Ref Range Status   Enterococcus faecalis NOT DETECTED NOT DETECTED Final   Enterococcus Faecium NOT DETECTED NOT DETECTED Final   Listeria monocytogenes NOT DETECTED NOT DETECTED Final   Staphylococcus species DETECTED (A) NOT DETECTED Final    Comment: CRITICAL RESULT CALLED TO, READ BACK BY AND VERIFIED WITH:  TREY GREENWOOD AT 2841 01/18/23 JG    Staphylococcus aureus (BCID) DETECTED (A) NOT DETECTED Final    Comment: CRITICAL RESULT CALLED TO, READ BACK BY AND VERIFIED WITH:  Gloriann Loan AT 0705 01/18/23 JG    Staphylococcus epidermidis  NOT DETECTED NOT DETECTED Final   Staphylococcus lugdunensis NOT DETECTED NOT DETECTED Final   Streptococcus species NOT DETECTED NOT DETECTED Final   Streptococcus agalactiae NOT DETECTED NOT DETECTED Final   Streptococcus pneumoniae NOT DETECTED NOT DETECTED Final   Streptococcus pyogenes NOT DETECTED NOT DETECTED Final   A.calcoaceticus-baumannii NOT DETECTED NOT DETECTED Final   Bacteroides fragilis NOT DETECTED NOT DETECTED Final   Enterobacterales NOT DETECTED NOT DETECTED Final   Enterobacter cloacae complex NOT DETECTED NOT DETECTED Final   Escherichia coli NOT DETECTED NOT DETECTED Final   Klebsiella aerogenes NOT DETECTED NOT DETECTED Final   Klebsiella oxytoca NOT DETECTED NOT DETECTED Final   Klebsiella pneumoniae NOT DETECTED NOT DETECTED Final   Proteus species NOT DETECTED NOT DETECTED Final   Salmonella species NOT DETECTED NOT DETECTED Final   Serratia  marcescens NOT DETECTED NOT DETECTED Final   Haemophilus influenzae NOT DETECTED NOT DETECTED Final   Neisseria meningitidis NOT DETECTED NOT DETECTED Final   Pseudomonas aeruginosa NOT DETECTED NOT DETECTED Final   Stenotrophomonas maltophilia NOT DETECTED NOT DETECTED Final   Candida albicans NOT DETECTED NOT DETECTED Final   Candida auris NOT DETECTED NOT DETECTED Final   Candida glabrata NOT DETECTED NOT DETECTED Final   Candida krusei NOT DETECTED NOT DETECTED Final   Candida parapsilosis NOT DETECTED NOT DETECTED Final   Candida tropicalis NOT DETECTED NOT DETECTED Final   Cryptococcus neoformans/gattii NOT DETECTED NOT DETECTED Final   Meth resistant mecA/C and MREJ NOT DETECTED NOT DETECTED Final    Comment: Performed at [facility], [address] [place], [place] [address]    IMAGING RESULTS: CXR  no infiltrate  Ct abd and pelvis Nonspecific soft tissue stranding in the prevertebral spaces of the L4-L5 and L5-S1 levels with mild endplate irregularity I have personally reviewed the  films ? 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 Professional time spent includes the following activities: Preparing to see the patient (review of tests), Obtaining and/or reviewing separately obtained history (admission/discharge record), Performing a medically appropriate examination and/or evaluation , Ordering medications/tests/procedures, referring and communicating with other health care professionals, Documenting clinical information in the EMR, Independently interpreting results (not separately reported), Communicating results to the patient/family/caregiver, Counseling and educating the patient/family/caregiver and Care coordination (not separately reported).    ________________________________________________  Note:  This document was prepared using Conservation officer, historic buildings and may include unintentional dictation errors.

## 2023-01-18 NOTE — Interval H&P Note (Signed)
History and Physical Interval Note:  01/18/2023 12:33 PM  Joseph [name]  has presented today for surgery, with the diagnosis of DVT LEFT ILIAC.  The various methods of treatment have been discussed with the patient and family. After consideration of risks, benefits and other options for treatment, the patient has consented to  Procedure(s): ABDOMINAL AORTOGRAM W/LOWER EXTREMITY (Left) as a surgical intervention.  The patient's history has been reviewed, patient examined, no change in status, stable for surgery.  I have reviewed the patient's chart and labs.  Questions were answered to the patient's satisfaction.     Levora Dredge

## 2023-01-18 NOTE — Consult Note (Signed)
PHARMACY - ANTICOAGULATION CONSULT NOTE  Pharmacy Consult for Heparin  Indication: DVT  Allergies  Allergen Reactions   Penicillins Shortness Of Breath   Pork-Derived Products Nausea Only   Tomato Rash   Banana Hives   Coconut (Cocos Nucifera) Hives   Patient Measurements: Height: 5' (152.4 cm) Weight: 81.2 kg (179 lb) IBW/kg (Calculated) : 50 Heparin Dosing Weight: 68.1 kg   Vital Signs: Temp: 98.3 F (36.8 C) (12/04 2021) Temp Source: Oral (12/04 2021) BP: 123/90 (12/05 0030) Pulse Rate: 93 (12/05 0030)  Labs: Recent Labs    01/17/23 0917 01/17/23 1611 01/17/23 2304  HGB 15.2  --   --   HCT 47.8  --   --   PLT 409*  --   --   APTT  --  34  --   LABPROT  --  14.8  --   INR  --  1.1  --   HEPARINUNFRC  --   --  0.13*  CREATININE 0.98  --   --    Estimated Creatinine Clearance: 85 mL/min (by C-G formula based on SCr of 0.98 mg/dL).  Medical History: Past Medical History:  Diagnosis Date   Asthma    COPD (chronic obstructive pulmonary disease) (HCC)    Eczema    Hypertension    Medications:  No PTA anticoagulation   Assessment: Joseph [name] is a 42 year old male that presented with hip and back pain. PMH is significant for chronic left hip pain secondary to labral tear followed by [facility] orthopedics on chronic steroids. From chart review, patient is not on any anticoagulation at home. Pharmacy has been consulted for initiation and management of a heparin infusion for a new DVT of the left illiac vein. Baseline labs: Hgb 15.2, PLT 409, aPTT and PT/INR ordered.   Goal of Therapy:  Heparin level 0.3-0.7 units/ml Monitor platelets by anticoagulation protocol: Yes   Plan:  12/4:  HL @ 2304 = 0.13, SUBtherapeutic - Will order heparin 2000 units IV X 1 bolus and increase drip rate to 1350 units/hr - Will recheck HL 6 hrs after rate change   Passion Lavin D, PharmD 01/18/2023 1:12 AM

## 2023-01-18 NOTE — Progress Notes (Signed)
PHARMACY - PHYSICIAN COMMUNICATION CRITICAL VALUE ALERT - BLOOD CULTURE IDENTIFICATION (BCID)  Joseph [name] is an 42 y.o. male who presented to [facility] on 01/17/2023 with a chief complaint of DVT.  Assessment:  Bcx: 4/4 GPC, BCID Staph Aureus with no resistance genes detected.  Name of physician (or Provider) Contacted: Dr. Ophelia Charter  Current antibiotics: None  Changes to prescribed antibiotics recommended:  Provider agreed to start patient on Cefazolin 2 grams IV every 8 hours.  Results for orders placed or performed during the hospital encounter of 01/17/23  Blood Culture ID Panel (Reflexed) (Collected: 01/17/2023  1:41 PM)  Result Value Ref Range   Enterococcus faecalis NOT DETECTED NOT DETECTED   Enterococcus Faecium NOT DETECTED NOT DETECTED   Listeria monocytogenes NOT DETECTED NOT DETECTED   Staphylococcus species DETECTED (A) NOT DETECTED   Staphylococcus aureus (BCID) DETECTED (A) NOT DETECTED   Staphylococcus epidermidis NOT DETECTED NOT DETECTED   Staphylococcus lugdunensis NOT DETECTED NOT DETECTED   Streptococcus species NOT DETECTED NOT DETECTED   Streptococcus agalactiae NOT DETECTED NOT DETECTED   Streptococcus pneumoniae NOT DETECTED NOT DETECTED   Streptococcus pyogenes NOT DETECTED NOT DETECTED   A.calcoaceticus-baumannii NOT DETECTED NOT DETECTED   Bacteroides fragilis NOT DETECTED NOT DETECTED   Enterobacterales NOT DETECTED NOT DETECTED   Enterobacter cloacae complex NOT DETECTED NOT DETECTED   Escherichia coli NOT DETECTED NOT DETECTED   Klebsiella aerogenes NOT DETECTED NOT DETECTED   Klebsiella oxytoca NOT DETECTED NOT DETECTED   Klebsiella pneumoniae NOT DETECTED NOT DETECTED   Proteus species NOT DETECTED NOT DETECTED   Salmonella species NOT DETECTED NOT DETECTED   Serratia marcescens NOT DETECTED NOT DETECTED   Haemophilus influenzae NOT DETECTED NOT DETECTED   Neisseria meningitidis NOT DETECTED NOT DETECTED   Pseudomonas aeruginosa NOT DETECTED  NOT DETECTED   Stenotrophomonas maltophilia NOT DETECTED NOT DETECTED   Candida albicans NOT DETECTED NOT DETECTED   Candida auris NOT DETECTED NOT DETECTED   Candida glabrata NOT DETECTED NOT DETECTED   Candida krusei NOT DETECTED NOT DETECTED   Candida parapsilosis NOT DETECTED NOT DETECTED   Candida tropicalis NOT DETECTED NOT DETECTED   Cryptococcus neoformans/gattii NOT DETECTED NOT DETECTED   Meth resistant mecA/C and MREJ NOT DETECTED NOT DETECTED    Barrie Folk, PharmD 01/18/2023  8:37 AM

## 2023-01-18 NOTE — Consult Note (Signed)
PHARMACY - ANTICOAGULATION CONSULT NOTE  Pharmacy Consult for Heparin  Indication: DVT  Allergies  Allergen Reactions   Penicillins Shortness Of Breath   Pork-Derived Products Nausea Only   Tomato Rash   Banana Hives   Coconut (Cocos Nucifera) Hives   Patient Measurements: Height: 5' (152.4 cm) Weight: 81.2 kg (179 lb) IBW/kg (Calculated) : 50 Heparin Dosing Weight: 68.1 kg   Vital Signs: Temp: 98.5 F (36.9 C) (12/05 0752) Temp Source: Oral (12/05 0752) BP: 120/92 (12/05 0730) Pulse Rate: 93 (12/05 0730)  Labs: Recent Labs    01/17/23 0917 01/17/23 1611 01/17/23 2304 01/18/23 0505  HGB 15.2  --   --  12.1*  HCT 47.8  --   --  37.5*  PLT 409*  --   --  343  APTT  --  34  --   --   LABPROT  --  14.8  --   --   INR  --  1.1  --   --   HEPARINUNFRC  --   --  0.13*  --   CREATININE 0.98  --   --  0.82   Estimated Creatinine Clearance: 101.6 mL/min (by C-G formula based on SCr of 0.82 mg/dL).  Medical History: Past Medical History:  Diagnosis Date   Asthma    COPD (chronic obstructive pulmonary disease) (HCC)    Eczema    Hypertension    Medications:  No PTA anticoagulation   Assessment: Kawliga Crumity is a 42 year old male that presented with hip and back pain. PMH is significant for chronic left hip pain secondary to labral tear followed by [facility] orthopedics on chronic steroids. From chart review, patient is not on any anticoagulation at home. Pharmacy has been consulted for initiation and management of a heparin infusion for a new DVT of the left illiac vein. Baseline labs: Hgb 15.2, PLT 409, aPTT and PT/INR ordered.   12/4:  HL @ 2304 = 0.13, SUBtherapeutic 12/5:  HL @ 0744 = <0.1, SUBtherapeutic  Goal of Therapy:  Heparin level 0.3-0.7 units/ml Monitor platelets by anticoagulation protocol: Yes   Plan:  12/5:  HL @ 0744 = <0.1, SUBtherapeutic - Will order heparin 2000 units IV X 1 bolus and increase drip rate to 1550 units/hr - Will recheck HL 6 hrs  after rate change   Merryl Hacker, PharmD 01/18/2023 8:15 AM

## 2023-01-18 NOTE — Consult Note (Signed)
Hospital Consult    Reason for Consult:  Left Iliac Venous thrombosis Requesting Physician:  Dr Willy Eddy MD  MRN #:  [id number]  History of Present Illness: This is a 42 y.o. 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 His back pain has been worse since yesterday, and when he found out that his white count was elevated on the lab by his PCP yesterday, he presented to the ED for further evaluation.  The back pain is worse with any movement and laying on his back. He denies any fevers, chills, IVDU, numbness or tingling, lower extremity pain.  Upon work up patient underwent MRI of the Lumbar spine without contrast. 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 This may explain some of his hip and back pain but unlikely. Vascular Surgery Consulted to evaluate.   Past Medical History:  Diagnosis Date   Asthma    COPD (chronic obstructive pulmonary disease) (HCC)    Eczema    Hypertension     Past Surgical History:  Procedure Laterality Date   OLECRANON BURSECTOMY Right 03/16/2022   Procedure: I&D OLECRANON (ELBOW) BURSA;  Surgeon: Signa Kell, MD;  Location: [facility] ORS;  Service: Orthopedics;  Laterality: Right;    Allergies  Allergen Reactions   Penicillins Shortness Of Breath   Pork-Derived Products Nausea Only   Tomato Rash   Banana Hives   Coconut (Cocos Nucifera) Hives    Prior to Admission medications   Medication Sig Start Date End Date Taking? Authorizing Provider  albuterol (PROVENTIL HFA;VENTOLIN HFA) 108 (90 Base) MCG/ACT inhaler Inhale 2 puffs into the lungs every 6 (six) hours as needed for wheezing or shortness of breath.   Yes [provider]  albuterol (PROVENTIL) (2.5 MG/3ML) 0.083% nebulizer solution Take 2.5 mg by  nebulization every 6 (six) hours as needed for wheezing or shortness of breath.   Yes [provider]  dupilumab (DUPIXENT) 300 MG/2ML prefilled syringe Inject 300 mg into the skin every 14 (fourteen) days.   Yes [provider]  furosemide (LASIX) 20 MG tablet Take 20 mg by mouth daily as needed for edema or fluid.   Yes [provider]  gabapentin (NEURONTIN) 300 MG capsule Take 300 mg by mouth 3 (three) times daily as needed (Neuropathy pain). 03/21/22  Yes [provider]  hydrochlorothiazide (HYDRODIURIL) 25 MG tablet Take 25 mg by mouth daily. 09/24/20  Yes [provider]  ibuprofen (ADVIL) 800 MG tablet Take 800 mg by mouth daily as needed for fever, headache, mild pain, moderate pain or cramping. 12/19/21  Yes [provider]  INCRUSE ELLIPTA 62.5 MCG/ACT AEPB Inhale 1 puff into the lungs daily. 07/04/22  Yes [provider]  losartan (COZAAR) 100 MG tablet Take 1 tablet (100 mg total) by mouth daily. 09/03/21  Yes Wouk, Wilfred Curtis, MD  montelukast (SINGULAIR) 10 MG tablet Take 10 mg by mouth at bedtime.   Yes [provider]  omeprazole (PRILOSEC) 20 MG capsule Take 20 mg by mouth daily as needed (GERD symptoms).   Yes [provider]  predniSONE (DELTASONE) 2.5 MG tablet Take 7.5 mg by mouth daily.   Yes [provider]  tiZANidine (ZANAFLEX) 2 MG tablet Take 2 mg by mouth 3 (three) times daily.  Yes [provider]  fluticasone-salmeterol (ADVAIR HFA) 230-21 MCG/ACT inhaler Inhale 2 puffs into the lungs every 12 (twelve) hours. Patient not taking: Reported on 01/17/2023    [provider]  HYDROcodone-acetaminophen (NORCO/VICODIN) 5-325 MG tablet Take 1-2 tablets by mouth every 4 (four) hours as needed for moderate pain or severe pain. Patient not taking: Reported on 01/17/2023 03/17/22   Dedra Skeens, [name]  tobramycin (TOBREX) 0.3 % ophthalmic solution Place 1-2 drops into the right eye in  the morning, at noon, in the evening, and at bedtime. Patient not taking: Reported on 01/17/2023 08/27/21   [provider]    Social History   Socioeconomic History   Marital status: Married    Spouse name: Not on file   Number of children: Not on file   Years of education: Not on file   Highest education level: Not on file  Occupational History   Not on file  Tobacco Use   Smoking status: Every Day    Current packs/day: 0.50    Types: Cigarettes   Smokeless tobacco: Never  Substance and Sexual Activity   Alcohol use: No   Drug use: Yes    Types: Marijuana   Sexual activity: Not on file  Other Topics Concern   Not on file  Social History Narrative   Not on file   Social Determinants of Health   Financial Resource Strain: Medium Risk (09/22/2022)   Received from [facility]   Overall Financial Resource Strain (CARDIA)    Difficulty of Paying Living Expenses: Somewhat hard  Food Insecurity: No Food Insecurity (09/22/2022)   Received from [facility]   Hunger Vital Sign    Worried About Running Out of Food in the Last Year: Never true    Ran Out of Food in the Last Year: Never true  Recent Concern: Food Insecurity - Food Insecurity Present (07/08/2022)   Received from [facility]   Hunger Vital Sign    Worried About Running Out of Food in the Last Year: Sometimes true    Ran Out of Food in the Last Year: Sometimes true  Transportation Needs: No Transportation Needs (09/22/2022)   Received from [facility] - Transportation    In the past 12 months, has lack of transportation kept you from medical appointments or from getting medications?: No    Lack of Transportation (Non-Medical): No  Recent Concern: Transportation Needs - Unmet Transportation Needs (07/08/2022)   Received from [facility] - Transportation    In the past 12 months, has lack of transportation  kept you from medical appointments or from getting medications?: Yes    Lack of Transportation (Non-Medical): No  Physical Activity: Not on file  Stress: Not on file  Social Connections: Not on file  Intimate Partner Violence: Not At Risk (03/16/2022)   Humiliation, Afraid, Rape, and Kick questionnaire    Fear of Current or Ex-Partner: No    Emotionally Abused: No    Physically Abused: No    Sexually Abused: No     Family History  Problem Relation Age of Onset   Hypertension Mother     ROS: Otherwise negative unless mentioned in HPI  Physical Examination  Vitals:   01/18/23 0530 01/18/23 0600  BP: 120/86 (!) 133/95  Pulse: 97 (!) 106  Resp: 17 19  Temp:    SpO2: 96% 100%   Body mass index is 34.96 kg/m.  General:  WDWN in NAD Gait: Not observed HENT: WNL, normocephalic Pulmonary: normal non-labored breathing, without Rales, rhonchi,  wheezing Cardiac: regular, without  Murmurs, rubs or gallops; without carotid bruits Abdomen: Positive bowel sounds throughout, soft, NT/ND, no masses Skin: without rashes Vascular Exam/Pulses: Palpable pulses throughout Extremities: without ischemic changes, without Gangrene , without cellulitis; without open wounds;  Musculoskeletal: no muscle wasting or atrophy  Neurologic: A&O X 3;  No focal weakness or paresthesias are detected; speech is fluent/normal Psychiatric:  The pt has Normal affect. Lymph:  Unremarkable  CBC    Component Value Date/Time   WBC 32.4 (H) 01/18/2023 0505   RBC 4.53 01/18/2023 0505   HGB 12.1 (L) 01/18/2023 0505   HCT 37.5 (L) 01/18/2023 0505   PLT 343 01/18/2023 0505   MCV 82.8 01/18/2023 0505   MCH 26.7 01/18/2023 0505   MCHC 32.3 01/18/2023 0505   RDW 18.0 (H) 01/18/2023 0505   LYMPHSABS 1.6 01/17/2023 0917   MONOABS 3.0 (H) 01/17/2023 0917   EOSABS 0.1 01/17/2023 0917   BASOSABS 0.1 01/17/2023 0917    BMET    Component Value Date/Time   NA 135 01/18/2023 0505   K 3.7 01/18/2023 0505   CL  99 01/18/2023 0505   CO2 28 01/18/2023 0505   GLUCOSE 121 (H) 01/18/2023 0505   BUN 24 (H) 01/18/2023 0505   CREATININE 0.82 01/18/2023 0505   CALCIUM 8.0 (L) 01/18/2023 0505   GFRNONAA >60 01/18/2023 0505   GFRAA >60 12/06/2017 0657    COAGS: Lab Results  Component Value Date   INR 1.1 01/17/2023     Non-Invasive Vascular Imaging:     EXAM:01/17/23 MRI LUMBAR SPINE WITHOUT CONTRAST   TECHNIQUE: Multiplanar, multisequence MR imaging of the lumbar spine was performed. No intravenous contrast was administered.   COMPARISON:  Radiography same day.  MRI 10/07/2014. CT same day.   FINDINGS: Segmentation:  5 lumbar type vertebral bodies.   Alignment:  Straightening of the normal cervical lordosis.   Vertebrae: No regional fracture or focal bone lesion. No likely spinal infection. See below.   Conus medullaris and cauda equina: Conus extends to the L1 level. Conus and cauda equina appear normal.   Paraspinal and other soft tissues: Deep venous thrombosis of the left iliac vein. This explains the CT finding. This is a significant clot burden and could place the patient at risk of large pulmonary embolism.   Disc levels:   No significant finding from T11-12 through L1-2.   L2-3: Moderate bulging of the disc.  No compressive stenosis.   L3-4: Moderate bulging of the disc. Stenosis of both lateral recesses with some potential for neural compression.   L4-5: Disc degeneration with fluid intensity material, not likely to be infected. Endplate osteophytes and bulging of the disc. Moderate stenosis which could be symptomatic.   L5-S1: Endplate osteophytes, broad-based disc herniation with upward and downward migration. No compressive stenosis of the distal thecal sac. Some potential that either S1 nerve could be affected by the caudally migrated disc material.   IMPRESSION: 1. 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 This explains the CT finding. This is a  significant clot burden and could place the patient at risk of large pulmonary embolism. 2. L3-4: Moderate bulging of the disc. Stenosis of both lateral recesses with some potential for neural compression. 3. L4-5: Disc degeneration with fluid intensity material, not likely to be infected. Endplate osteophytes and bulging of the disc. Moderate stenosis which could be symptomatic. 4.  L5-S1: Endplate osteophytes, broad-based disc herniation with upward and downward migration. No compressive stenosis of the distal thecal sac. Some potential that either S1 nerve could be affected by the caudally migrated disc material. 5. These results were called by telephone at the time of interpretation on 01/17/2023 at 3:25 pm to provider [facility] , who verbally acknowledged these results.   EXAM:01/17/23 CT ABDOMEN AND PELVIS WITH CONTRAST   TECHNIQUE: Multidetector CT imaging of the abdomen and pelvis was performed using the standard protocol following bolus administration of intravenous contrast.   RADIATION DOSE REDUCTION: This exam was performed according to the departmental dose-optimization program which includes automated exposure control, adjustment of the mA and/or kV according to patient size and/or use of iterative reconstruction technique.   CONTRAST:  OMNIPAQUE IOHEXOL 300 MG/ML  SOLN   COMPARISON:  None Available.   FINDINGS: Lower chest: Lung bases are clear.   Hepatobiliary: Liver has a normal contour. No focal liver lesions are visualized. No perihepatic fluid. No evidence of intra or extrahepatic biliary ductal dilatation. No evidence of cholelithiasis or cholecystitis.   Pancreas: No evidence of peripancreatic fat stranding to suggest pancreatitis.   Spleen: Normal in size without focal abnormality.   Adrenals/Urinary Tract: Bilateral adrenal glands are normal in appearance. Bilateral kidneys enhance homogeneously and symmetrically. No evidence of  hydronephrosis or nephrolithiasis. There is a hypodense lesion of the interpolar region of the right kidney, favored to represent a simple renal cyst. Urinary bladder is fluid-filled without focal abnormalities.   Stomach/Bowel: No evidence of bowel obstruction. There is mild diverticulosis without evidence of diverticulitis. The appendix is normal in appearance.   Vascular/Lymphatic: No significant vascular findings are present. No enlarged abdominal or pelvic lymph nodes.   Reproductive: Prostate is unremarkable.   Other: No abdominal wall hernia or abnormality. No abdominopelvic ascites.   Musculoskeletal: No fracture is seen. Chronic bilateral rib fractures. There is nonspecific soft tissue stranding in the prevertebral spaces of the L4-L5 and L5-S1 levels with mild endplate irregularity. While nonspecific, findings can be seen in the setting of discitis/osteomyelitis. Recommend further evaluation with a contrast-enhanced lumbar spine MRI   IMPRESSION: 1. Nonspecific soft tissue stranding in the prevertebral spaces of the L4-L5 and L5-S1 levels with mild endplate irregularity. While nonspecific, findings can be seen in the setting of discitis/osteomyelitis. Recommend further evaluation with a contrast-enhanced lumbar spine MRI. An additional less likely differential consideration is possible thrombophlebitis of the adjacent common iliac vein. Recommend attention on MRI. 2. Mild diverticulosis without evidence of diverticulitis.    Statin:  No. Beta Blocker:  No. Aspirin:  No. ACEI:  No. ARB:  Yes.   CCB use:  No Other antiplatelets/anticoagulants:  No.    ASSESSMENT/PLAN: This is a 42 y.o. male who presents to [facility] emergency department with worsening low back pain left hip pain over the past couple of days.  He went to his PCP and had labs drawn which she had an elevated white count decided to show up in the emergency department for further evaluation.  Vascular  surgery plans on taking the patient to the vascular lab today on 01/18/2023, for a left lower extremity venous thrombectomy of the left iliac vein.  I discussed in detail with the patient the procedure, benefits, risk, and complications.  Patient verbalizes understanding.  Patient would like to proceed as soon as possible.  Answered all the patient's questions today.  Patient has been made n.p.o. for procedure later today.  I discussed the plan in  detail with Dr. Levora Dredge MD and he agrees with plan.   Marcie Bal [facility] 01/18/2023 7:10 AM
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
## 2023-01-18 NOTE — H&P (View-Only) (Signed)
Hospital Consult    Reason for Consult:  Left Iliac Venous thrombosis Requesting Physician:  Dr Willy Eddy MD  MRN #:  [id number]  History of Present Illness: This is a 42 y.o. 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 His back pain has been worse since yesterday, and when he found out that his white count was elevated on the lab by his PCP yesterday, he presented to the ED for further evaluation.  The back pain is worse with any movement and laying on his back. He denies any fevers, chills, IVDU, numbness or tingling, lower extremity pain.  Upon work up patient underwent MRI of the Lumbar spine without contrast. 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 This may explain some of his hip and back pain but unlikely. Vascular Surgery Consulted to evaluate.   Past Medical History:  Diagnosis Date   Asthma    COPD (chronic obstructive pulmonary disease) (HCC)    Eczema    Hypertension     Past Surgical History:  Procedure Laterality Date   OLECRANON BURSECTOMY Right 03/16/2022   Procedure: I&D OLECRANON (ELBOW) BURSA;  Surgeon: Signa Kell, MD;  Location: [facility] ORS;  Service: Orthopedics;  Laterality: Right;    Allergies  Allergen Reactions   Penicillins Shortness Of Breath   Pork-Derived Products Nausea Only   Tomato Rash   Banana Hives   Coconut (Cocos Nucifera) Hives    Prior to Admission medications   Medication Sig Start Date End Date Taking? Authorizing Provider  albuterol (PROVENTIL HFA;VENTOLIN HFA) 108 (90 Base) MCG/ACT inhaler Inhale 2 puffs into the lungs every 6 (six) hours as needed for wheezing or shortness of breath.   Yes [provider]  albuterol (PROVENTIL) (2.5 MG/3ML) 0.083% nebulizer solution Take 2.5 mg by  nebulization every 6 (six) hours as needed for wheezing or shortness of breath.   Yes [provider]  dupilumab (DUPIXENT) 300 MG/2ML prefilled syringe Inject 300 mg into the skin every 14 (fourteen) days.   Yes [provider]  furosemide (LASIX) 20 MG tablet Take 20 mg by mouth daily as needed for edema or fluid.   Yes [provider]  gabapentin (NEURONTIN) 300 MG capsule Take 300 mg by mouth 3 (three) times daily as needed (Neuropathy pain). 03/21/22  Yes [provider]  hydrochlorothiazide (HYDRODIURIL) 25 MG tablet Take 25 mg by mouth daily. 09/24/20  Yes [provider]  ibuprofen (ADVIL) 800 MG tablet Take 800 mg by mouth daily as needed for fever, headache, mild pain, moderate pain or cramping. 12/19/21  Yes [provider]  INCRUSE ELLIPTA 62.5 MCG/ACT AEPB Inhale 1 puff into the lungs daily. 07/04/22  Yes [provider]  losartan (COZAAR) 100 MG tablet Take 1 tablet (100 mg total) by mouth daily. 09/03/21  Yes Wouk, Wilfred Curtis, MD  montelukast (SINGULAIR) 10 MG tablet Take 10 mg by mouth at bedtime.   Yes [provider]  omeprazole (PRILOSEC) 20 MG capsule Take 20 mg by mouth daily as needed (GERD symptoms).   Yes [provider]  predniSONE (DELTASONE) 2.5 MG tablet Take 7.5 mg by mouth daily.   Yes [provider]  tiZANidine (ZANAFLEX) 2 MG tablet Take 2 mg by mouth 3 (three) times daily.  Yes [provider]  fluticasone-salmeterol (ADVAIR HFA) 230-21 MCG/ACT inhaler Inhale 2 puffs into the lungs every 12 (twelve) hours. Patient not taking: Reported on 01/17/2023    [provider]  HYDROcodone-acetaminophen (NORCO/VICODIN) 5-325 MG tablet Take 1-2 tablets by mouth every 4 (four) hours as needed for moderate pain or severe pain. Patient not taking: Reported on 01/17/2023 03/17/22   Dedra Skeens, [name]  tobramycin (TOBREX) 0.3 % ophthalmic solution Place 1-2 drops into the right eye in  the morning, at noon, in the evening, and at bedtime. Patient not taking: Reported on 01/17/2023 08/27/21   [provider]    Social History   Socioeconomic History   Marital status: Married    Spouse name: Not on file   Number of children: Not on file   Years of education: Not on file   Highest education level: Not on file  Occupational History   Not on file  Tobacco Use   Smoking status: Every Day    Current packs/day: 0.50    Types: Cigarettes   Smokeless tobacco: Never  Substance and Sexual Activity   Alcohol use: No   Drug use: Yes    Types: Marijuana   Sexual activity: Not on file  Other Topics Concern   Not on file  Social History Narrative   Not on file   Social Determinants of Health   Financial Resource Strain: Medium Risk (09/22/2022)   Received from [facility]   Overall Financial Resource Strain (CARDIA)    Difficulty of Paying Living Expenses: Somewhat hard  Food Insecurity: No Food Insecurity (09/22/2022)   Received from [facility]   Hunger Vital Sign    Worried About Running Out of Food in the Last Year: Never true    Ran Out of Food in the Last Year: Never true  Recent Concern: Food Insecurity - Food Insecurity Present (07/08/2022)   Received from [facility]   Hunger Vital Sign    Worried About Running Out of Food in the Last Year: Sometimes true    Ran Out of Food in the Last Year: Sometimes true  Transportation Needs: No Transportation Needs (09/22/2022)   Received from [facility] - Transportation    In the past 12 months, has lack of transportation kept you from medical appointments or from getting medications?: No    Lack of Transportation (Non-Medical): No  Recent Concern: Transportation Needs - Unmet Transportation Needs (07/08/2022)   Received from [facility] - Transportation    In the past 12 months, has lack of transportation  kept you from medical appointments or from getting medications?: Yes    Lack of Transportation (Non-Medical): No  Physical Activity: Not on file  Stress: Not on file  Social Connections: Not on file  Intimate Partner Violence: Not At Risk (03/16/2022)   Humiliation, Afraid, Rape, and Kick questionnaire    Fear of Current or Ex-Partner: No    Emotionally Abused: No    Physically Abused: No    Sexually Abused: No     Family History  Problem Relation Age of Onset   Hypertension Mother     ROS: Otherwise negative unless mentioned in HPI  Physical Examination  Vitals:   01/18/23 0530 01/18/23 0600  BP: 120/86 (!) 133/95  Pulse: 97 (!) 106  Resp: 17 19  Temp:    SpO2: 96% 100%   Body mass index is 34.96 kg/m.  General:  WDWN in NAD Gait: Not observed HENT: WNL, normocephalic Pulmonary: normal non-labored breathing, without Rales, rhonchi,  wheezing Cardiac: regular, without  Murmurs, rubs or gallops; without carotid bruits Abdomen: Positive bowel sounds throughout, soft, NT/ND, no masses Skin: without rashes Vascular Exam/Pulses: Palpable pulses throughout Extremities: without ischemic changes, without Gangrene , without cellulitis; without open wounds;  Musculoskeletal: no muscle wasting or atrophy  Neurologic: A&O X 3;  No focal weakness or paresthesias are detected; speech is fluent/normal Psychiatric:  The pt has Normal affect. Lymph:  Unremarkable  CBC    Component Value Date/Time   WBC 32.4 (H) 01/18/2023 0505   RBC 4.53 01/18/2023 0505   HGB 12.1 (L) 01/18/2023 0505   HCT 37.5 (L) 01/18/2023 0505   PLT 343 01/18/2023 0505   MCV 82.8 01/18/2023 0505   MCH 26.7 01/18/2023 0505   MCHC 32.3 01/18/2023 0505   RDW 18.0 (H) 01/18/2023 0505   LYMPHSABS 1.6 01/17/2023 0917   MONOABS 3.0 (H) 01/17/2023 0917   EOSABS 0.1 01/17/2023 0917   BASOSABS 0.1 01/17/2023 0917    BMET    Component Value Date/Time   NA 135 01/18/2023 0505   K 3.7 01/18/2023 0505   CL  99 01/18/2023 0505   CO2 28 01/18/2023 0505   GLUCOSE 121 (H) 01/18/2023 0505   BUN 24 (H) 01/18/2023 0505   CREATININE 0.82 01/18/2023 0505   CALCIUM 8.0 (L) 01/18/2023 0505   GFRNONAA >60 01/18/2023 0505   GFRAA >60 12/06/2017 0657    COAGS: Lab Results  Component Value Date   INR 1.1 01/17/2023     Non-Invasive Vascular Imaging:     EXAM:01/17/23 MRI LUMBAR SPINE WITHOUT CONTRAST   TECHNIQUE: Multiplanar, multisequence MR imaging of the lumbar spine was performed. No intravenous contrast was administered.   COMPARISON:  Radiography same day.  MRI 10/07/2014. CT same day.   FINDINGS: Segmentation:  5 lumbar type vertebral bodies.   Alignment:  Straightening of the normal cervical lordosis.   Vertebrae: No regional fracture or focal bone lesion. No likely spinal infection. See below.   Conus medullaris and cauda equina: Conus extends to the L1 level. Conus and cauda equina appear normal.   Paraspinal and other soft tissues: Deep venous thrombosis of the left iliac vein. This explains the CT finding. This is a significant clot burden and could place the patient at risk of large pulmonary embolism.   Disc levels:   No significant finding from T11-12 through L1-2.   L2-3: Moderate bulging of the disc.  No compressive stenosis.   L3-4: Moderate bulging of the disc. Stenosis of both lateral recesses with some potential for neural compression.   L4-5: Disc degeneration with fluid intensity material, not likely to be infected. Endplate osteophytes and bulging of the disc. Moderate stenosis which could be symptomatic.   L5-S1: Endplate osteophytes, broad-based disc herniation with upward and downward migration. No compressive stenosis of the distal thecal sac. Some potential that either S1 nerve could be affected by the caudally migrated disc material.   IMPRESSION: 1. 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 This explains the CT finding. This is a  significant clot burden and could place the patient at risk of large pulmonary embolism. 2. L3-4: Moderate bulging of the disc. Stenosis of both lateral recesses with some potential for neural compression. 3. L4-5: Disc degeneration with fluid intensity material, not likely to be infected. Endplate osteophytes and bulging of the disc. Moderate stenosis which could be symptomatic. 4.  L5-S1: Endplate osteophytes, broad-based disc herniation with upward and downward migration. No compressive stenosis of the distal thecal sac. Some potential that either S1 nerve could be affected by the caudally migrated disc material. 5. These results were called by telephone at the time of interpretation on 01/17/2023 at 3:25 pm to provider [facility] , who verbally acknowledged these results.   EXAM:01/17/23 CT ABDOMEN AND PELVIS WITH CONTRAST   TECHNIQUE: Multidetector CT imaging of the abdomen and pelvis was performed using the standard protocol following bolus administration of intravenous contrast.   RADIATION DOSE REDUCTION: This exam was performed according to the departmental dose-optimization program which includes automated exposure control, adjustment of the mA and/or kV according to patient size and/or use of iterative reconstruction technique.   CONTRAST:  OMNIPAQUE IOHEXOL 300 MG/ML  SOLN   COMPARISON:  None Available.   FINDINGS: Lower chest: Lung bases are clear.   Hepatobiliary: Liver has a normal contour. No focal liver lesions are visualized. No perihepatic fluid. No evidence of intra or extrahepatic biliary ductal dilatation. No evidence of cholelithiasis or cholecystitis.   Pancreas: No evidence of peripancreatic fat stranding to suggest pancreatitis.   Spleen: Normal in size without focal abnormality.   Adrenals/Urinary Tract: Bilateral adrenal glands are normal in appearance. Bilateral kidneys enhance homogeneously and symmetrically. No evidence of  hydronephrosis or nephrolithiasis. There is a hypodense lesion of the interpolar region of the right kidney, favored to represent a simple renal cyst. Urinary bladder is fluid-filled without focal abnormalities.   Stomach/Bowel: No evidence of bowel obstruction. There is mild diverticulosis without evidence of diverticulitis. The appendix is normal in appearance.   Vascular/Lymphatic: No significant vascular findings are present. No enlarged abdominal or pelvic lymph nodes.   Reproductive: Prostate is unremarkable.   Other: No abdominal wall hernia or abnormality. No abdominopelvic ascites.   Musculoskeletal: No fracture is seen. Chronic bilateral rib fractures. There is nonspecific soft tissue stranding in the prevertebral spaces of the L4-L5 and L5-S1 levels with mild endplate irregularity. While nonspecific, findings can be seen in the setting of discitis/osteomyelitis. Recommend further evaluation with a contrast-enhanced lumbar spine MRI   IMPRESSION: 1. Nonspecific soft tissue stranding in the prevertebral spaces of the L4-L5 and L5-S1 levels with mild endplate irregularity. While nonspecific, findings can be seen in the setting of discitis/osteomyelitis. Recommend further evaluation with a contrast-enhanced lumbar spine MRI. An additional less likely differential consideration is possible thrombophlebitis of the adjacent common iliac vein. Recommend attention on MRI. 2. Mild diverticulosis without evidence of diverticulitis.    Statin:  No. Beta Blocker:  No. Aspirin:  No. ACEI:  No. ARB:  Yes.   CCB use:  No Other antiplatelets/anticoagulants:  No.    ASSESSMENT/PLAN: This is a 42 y.o. male who presents to [facility] emergency department with worsening low back pain left hip pain over the past couple of days.  He went to his PCP and had labs drawn which she had an elevated white count decided to show up in the emergency department for further evaluation.  Vascular  surgery plans on taking the patient to the vascular lab today on 01/18/2023, for a left lower extremity venous thrombectomy of the left iliac vein.  I discussed in detail with the patient the procedure, benefits, risk, and complications.  Patient verbalizes understanding.  Patient would like to proceed as soon as possible.  Answered all the patient's questions today.  Patient has been made n.p.o. for procedure later today.  I discussed the plan in  detail with Dr. Levora Dredge MD and he agrees with plan.   Marcie [facility] 01/18/2023 7:10 AM

## 2023-01-18 NOTE — Op Note (Signed)
[name]  Percutaneous Study/Intervention Procedural Note   Date of Surgery: 01/18/2023,8:39 PM  Surgeon:Tyronn Golda, Latina Craver   Pre-operative Diagnosis: Left common iliac venous thrombophlebitis  Post-operative diagnosis: Normal venous system likely discitis   Procedure(s) Performed:  1.  Ultrasound-guided access to the left common femoral vein  2.  Contrast injection left lower extremity venous system  3.  Inferior venacavogram    Anesthesia: Conscious sedation was administered by the interventional radiology RN under my direct supervision. IV Versed plus fentanyl were utilized. Continuous ECG, pulse oximetry and blood pressure was monitored throughout the entire procedure. Conscious sedation was administered for a total of 18 minutes.  Sheath: 6 French sheath  Contrast: 15 cc   Fluoroscopy Time: 0.3 minutes  Indications: Patient presented with lower back and left hip pain.  CT as well as MRI suggested thrombophlebitis of the left common iliac vein.  He is therefore undergoing angiography/venography with the hope for intervention.  Risks and benefits were reviewed all questions answered patient agrees to proceed.  Procedure:  [name] a 42 y.o. male who was identified and appropriate procedural time out was performed.  The patient was then placed supine on the table and prepped and draped in the usual sterile fashion.  Ultrasound was used to evaluate the left common femoral vein.  It was echolucent and compressible indicating it is patent .  An ultrasound image was acquired for the permanent record.  A micropuncture needle was used to access the left common femoral vein under direct ultrasound guidance.  The microwire was then advanced under fluoroscopic guidance without difficulty followed by the micro-sheath.  A 0.035 J wire was advanced without resistance and a 6Fr sheath was placed.    Hand-injection contrast is then used to perform imaging of the  left common femoral vein as well as the left external iliac vein, left common iliac vein as well as the inferior vena cava.  Imaging is obtained in AP as well as LAO and RAO projections.  After review of these images the sheath is pulled and pressure is held.  Findings:   IVC: Normal no evidence of thrombus, strictures or flow-limiting lesions noted.  Left Lower Extremity venogram: The left common femoral, external iliac vein as well as the left common iliac vein is free of thrombus.  There are no intraluminal abnormalities.  There is no thrombus, strictures or flow-limiting lesions noted.  Disposition: Patient was taken to the recovery room in stable condition having tolerated the procedure well.  [name] 01/18/2023,8:39 PM

## 2023-01-19 ENCOUNTER — Inpatient Hospital Stay: Payer: Medicare Other

## 2023-01-19 ENCOUNTER — Inpatient Hospital Stay (HOSPITAL_COMMUNITY)
Admit: 2023-01-19 | Discharge: 2023-01-19 | Disposition: A | Discharge status: Home / Self Care | Payer: Medicare Other | Attending: Internal Medicine | Admitting: Internal Medicine

## 2023-01-19 ENCOUNTER — Encounter: Payer: Self-pay | Admitting: Vascular Surgery

## 2023-01-19 DIAGNOSIS — B9561 Methicillin susceptible Staphylococcus aureus infection as the cause of diseases classified elsewhere: Secondary | ICD-10-CM | POA: Diagnosis not present

## 2023-01-19 DIAGNOSIS — M79605 Pain in left leg: Secondary | ICD-10-CM | POA: Diagnosis not present

## 2023-01-19 DIAGNOSIS — R7881 Bacteremia: Secondary | ICD-10-CM

## 2023-01-19 DIAGNOSIS — J4550 Severe persistent asthma, uncomplicated: Secondary | ICD-10-CM

## 2023-01-19 DIAGNOSIS — R6510 Systemic inflammatory response syndrome (SIRS) of non-infectious origin without acute organ dysfunction: Secondary | ICD-10-CM | POA: Diagnosis not present

## 2023-01-19 DIAGNOSIS — M545 Low back pain, unspecified: Secondary | ICD-10-CM | POA: Diagnosis not present

## 2023-01-19 DIAGNOSIS — Z515 Encounter for palliative care: Secondary | ICD-10-CM

## 2023-01-19 DIAGNOSIS — M5136 Other intervertebral disc degeneration, lumbar region with discogenic back pain only: Secondary | ICD-10-CM | POA: Diagnosis not present

## 2023-01-19 DIAGNOSIS — I82422 Acute embolism and thrombosis of left iliac vein: Secondary | ICD-10-CM | POA: Diagnosis not present

## 2023-01-19 LAB — ECHOCARDIOGRAM COMPLETE
AR max vel: 3.5 cm2
AV Area VTI: 4.25 cm2
AV Area mean vel: 3.44 cm2
AV Mean grad: 3 mm[Hg]
AV Peak grad: 5.2 mm[Hg]
Ao pk vel: 1.14 m/s
Area-P 1/2: 9.03 cm2
Height: 60 in
S' Lateral: 3.4 cm
Weight: 2864.22 [oz_av]

## 2023-01-19 LAB — CBC WITH DIFFERENTIAL/PLATELET
Abs Immature Granulocytes: 0.2 10*3/uL — ABNORMAL HIGH (ref 0.00–0.07)
Basophils Absolute: 0.1 10*3/uL (ref 0.0–0.1)
Basophils Relative: 0 %
Eosinophils Absolute: 0 10*3/uL (ref 0.0–0.5)
Eosinophils Relative: 0 %
HCT: 40.4 % (ref 39.0–52.0)
Hemoglobin: 12.6 g/dL — ABNORMAL LOW (ref 13.0–17.0)
Immature Granulocytes: 1 %
Lymphocytes Relative: 3 %
Lymphs Abs: 0.6 10*3/uL — ABNORMAL LOW (ref 0.7–4.0)
MCH: 26.3 pg (ref 26.0–34.0)
MCHC: 31.2 g/dL (ref 30.0–36.0)
MCV: 84.3 fL (ref 80.0–100.0)
Monocytes Absolute: 2.2 10*3/uL — ABNORMAL HIGH (ref 0.1–1.0)
Monocytes Relative: 10 %
Neutro Abs: 18.4 10*3/uL — ABNORMAL HIGH (ref 1.7–7.7)
Neutrophils Relative %: 86 %
Platelets: 380 10*3/uL (ref 150–400)
RBC: 4.79 MIL/uL (ref 4.22–5.81)
RDW: 18.1 % — ABNORMAL HIGH (ref 11.5–15.5)
WBC: 21.5 10*3/uL — ABNORMAL HIGH (ref 4.0–10.5)
nRBC: 0.2 % (ref 0.0–0.2)

## 2023-01-19 LAB — CBC
HCT: 40 % (ref 39.0–52.0)
Hemoglobin: 12.6 g/dL — ABNORMAL LOW (ref 13.0–17.0)
MCH: 26.5 pg (ref 26.0–34.0)
MCHC: 31.5 g/dL (ref 30.0–36.0)
MCV: 84 fL (ref 80.0–100.0)
Platelets: 369 10*3/uL (ref 150–400)
RBC: 4.76 MIL/uL (ref 4.22–5.81)
RDW: 18.2 % — ABNORMAL HIGH (ref 11.5–15.5)
WBC: 21.9 10*3/uL — ABNORMAL HIGH (ref 4.0–10.5)
nRBC: 0.2 % (ref 0.0–0.2)

## 2023-01-19 LAB — TECHNOLOGIST SMEAR REVIEW: Plt Morphology: NORMAL

## 2023-01-19 LAB — GLUCOSE, CAPILLARY
Glucose-Capillary: 160 mg/dL — ABNORMAL HIGH (ref 70–99)
Glucose-Capillary: 125 mg/dL — ABNORMAL HIGH (ref 70–99)

## 2023-01-19 MED ORDER — LIDOCAINE 5 % EX PTCH
1.0000 | MEDICATED_PATCH | CUTANEOUS | Status: DC
  Administered 2023-01-19 – 2023-01-24 (×6): 1 via TRANSDERMAL
  Filled 2023-01-19 (×7): qty 1

## 2023-01-19 MED ORDER — QUETIAPINE FUMARATE 25 MG PO TABS
25.0000 mg | ORAL_TABLET | Freq: Every day | ORAL | Status: DC
  Administered 2023-01-19 – 2023-01-24 (×6): 25 mg via ORAL
  Filled 2023-01-19 (×6): qty 1

## 2023-01-19 MED ORDER — METHOCARBAMOL 500 MG PO TABS: 500.0000 mg | ORAL_TABLET | Freq: Four times a day (QID) | ORAL | Status: DC | PRN

## 2023-01-19 MED ORDER — PREDNISONE 2.5 MG PO TABS
7.5000 mg | ORAL_TABLET | Freq: Every day | ORAL | Status: DC
  Administered 2023-01-20 – 2023-01-21 (×2): 7.5 mg via ORAL
  Filled 2023-01-19 (×4): qty 1

## 2023-01-19 MED ORDER — ENSURE ENLIVE PO LIQD
237.0000 mL | Freq: Three times a day (TID) | ORAL | Status: DC
  Administered 2023-01-20 – 2023-01-23 (×10): 237 mL via ORAL

## 2023-01-19 MED ORDER — ADULT MULTIVITAMIN W/MINERALS CH
1.0000 | ORAL_TABLET | Freq: Every day | ORAL | Status: DC
  Administered 2023-01-19 – 2023-01-25 (×6): 1 via ORAL
  Filled 2023-01-19 (×7): qty 1

## 2023-01-19 MED ORDER — DULOXETINE HCL 20 MG PO CPEP
20.0000 mg | ORAL_CAPSULE | Freq: Every day | ORAL | Status: DC
  Administered 2023-01-20 – 2023-01-25 (×5): 20 mg via ORAL
  Filled 2023-01-19 (×7): qty 1

## 2023-01-19 MED ORDER — GABAPENTIN 300 MG PO CAPS
300.0000 mg | ORAL_CAPSULE | Freq: Three times a day (TID) | ORAL | Status: DC
Start: 2023-01-19 — End: 2023-01-25
  Administered 2023-01-19 – 2023-01-25 (×16): 300 mg via ORAL
  Filled 2023-01-19 (×17): qty 1

## 2023-01-19 MED ORDER — ENOXAPARIN SODIUM 40 MG/0.4ML IJ SOSY
40.0000 mg | PREFILLED_SYRINGE | Freq: Every day | INTRAMUSCULAR | Status: DC
  Administered 2023-01-19 – 2023-01-24 (×6): 40 mg via SUBCUTANEOUS
  Filled 2023-01-19 (×5): qty 0.4

## 2023-01-19 MED ORDER — INSULIN ASPART 100 UNIT/ML IJ SOLN
0.0000 [IU] | Freq: Three times a day (TID) | INTRAMUSCULAR | Status: DC
  Administered 2023-01-19: 3 [IU] via SUBCUTANEOUS
  Filled 2023-01-19 (×2): qty 1

## 2023-01-19 MED ORDER — OXYCODONE HCL 5 MG PO TABS
5.0000 mg | ORAL_TABLET | ORAL | Status: DC | PRN
  Administered 2023-01-22: 5 mg via ORAL
  Filled 2023-01-19: qty 1

## 2023-01-19 MED ORDER — HYDROMORPHONE HCL 2 MG PO TABS
2.0000 mg | ORAL_TABLET | Freq: Three times a day (TID) | ORAL | Status: DC
  Administered 2023-01-19 – 2023-01-25 (×16): 2 mg via ORAL
  Filled 2023-01-19 (×17): qty 1

## 2023-01-19 MED ORDER — GADOBUTROL 1 MMOL/ML IV SOLN
8.0000 mL | Freq: Once | INTRAVENOUS | Status: AC | PRN
  Administered 2023-01-19: 8 mL via INTRAVENOUS

## 2023-01-19 MED ORDER — HYDROMORPHONE HCL 1 MG/ML IJ SOLN
1.0000 mg | INTRAMUSCULAR | Status: DC | PRN
  Administered 2023-01-19 – 2023-01-22 (×21): 1 mg via INTRAVENOUS
  Filled 2023-01-19 (×21): qty 1

## 2023-01-19 NOTE — Consult Note (Addendum)
Consultation Note Date: 01/19/2023 at 1030  Patient Name: [name]  DOB: 12/08/1980  MRN: [id number]  Age / Sex: 42 y.o., male  PCP: Wilford Corner, [name] Referring Physician: [name], MD  HPI/Patient Profile: 42 y.o. male  with past medical history of severe persistent asthma COPD overlap syndrome, steroid-dependent, on dupulimab, allergic rhinitis, elevated IgE, eczema ,nodular infiltrate lungs in 2019, MRSA rt olecranon bursitis in Feb 2024  admitted on 01/17/2023 with worsening low back pain.  Pain was so severe that patient fell in the bedroom floor and cannot pick himself back up.  Thus, EMS was called and he was brought to the ED.  Patient endorses chronic low back pain but it worsened in the 24 to 48 hours prior to admission  PMT was consulted to assist with pain control as workup is pending for etiology of acute back pain..   Clinical Assessment and Goals of Care: Extensive chart review completed prior to meeting patient including labs, vital signs, imaging, progress notes, orders, and available advanced directive documents from current and previous encounters. I then met with patient and his wife at bedside to discuss diagnosis prognosis, GOC, EOL wishes, disposition and options.  I introduced Palliative Medicine as specialized medical care for people living with serious illness. It focuses on providing relief from the symptoms and stress of a serious illness. The goal is to improve quality of life for both the patient and the family.  We discussed a brief life review of the patient.  Patient is originally from the [place], [place].  He has been disabled for some time and not working.  His wife is originally from [place] and they moved here to escape the city life.  Patient has 2 adult children of his own but no children with his current wife.  As far as functional and  nutritional status patient endorses complete independence with ADLs prior to low back pain onset approximately 4 days ago.  He denies difficulty with p.o. intake, nausea, vomiting, diarrhea, constipation, or other acute ailments.  Symptoms assessed.  Complete pain assessment performed.  Patient describes pain as a tightness but does not radiate consistently.  The pain is located in his mid low back.  He endorses left hip pain as well.  He denies a sharp shooting pain.  He endorses pain is minimal without movement but excruciating with any movement.  He is unable to tell me what medications alleviate his pain.  He endorses that the pain is so severe that he is unable to rest.  He also endorses restlessness and worrying over wanting to know what is going on with his body.  As per chart review, I recommend the following medications/adjustments to his chart to more adequately control his pain as workup continues to find source of pain:  -Gabapentin 300mg  TID - no longer PRN -Lidocaine patch to lower back -Dilaudid p.o. 2 mg 3 times daily -IV dilaudid to remain for breakthrough pain -Seroquel 25 mg p.o. nightly for sleep aid-patient complains that he  is unable to sleep through the night because he is restless as well as in pain -Tylenol 650 mg scheduled 4 times daily # patient shares that Tylenol and Percocet do not work for him but we discussed its synergistic effect with other medications -Cluster care to minimize disturbances and maximize patient's rest -Cymbalta 20 mg p.o. daily -education provided to patient that this will not have immediate effect on his pain or discomfort but that it will eventually build up over time.  Also discussed its dual use for nerve pain as well as anxiety/depression.  Patient denies SI/HI but endorses worry and anxiety over what is going on with him medically.  Discussed above recommendations with attending Dr. Ophelia Charter.  My final recommendation to be Celexa 200 mg twice  daily.  However, attending would like to hold off and await results of further imaging.  I spoke with patient's wife over the phone and conveyed above recommendations.  She was grateful for the phone call and share she will let the patient know of our changes when he is awake.  I am off service until Tuesday at which time I will follow-up with the patient.  Patient and wife made aware that I am off service until Tuesday.  I shared they should reach out to the palliative team should acute palliative issues arise.    Please utilize [employer] for on-call provider should palliative needs.  Primary Decision Maker PATIENT  Physical Exam Vitals reviewed.  Constitutional:      Appearance: He is normal weight.  HENT:     Head: Normocephalic.     Mouth/Throat:     Mouth: Mucous membranes are moist.  Eyes:     Pupils: Pupils are equal, round, and reactive to light.  Pulmonary:     Effort: Pulmonary effort is normal.  Abdominal:     Palpations: Abdomen is soft.  Musculoskeletal:     Comments: MAETC but is limited by pain  Skin:    General: Skin is warm and dry.  Neurological:     Mental Status: He is alert and oriented to person, place, and time.  Psychiatric:        Mood and Affect: Mood normal.        Behavior: Behavior normal.        Thought Content: Thought content normal.        Judgment: Judgment normal.     Palliative Assessment/Data: 70%     Thank you for this consult. Palliative medicine will continue to follow and assist holistically.   Time Total: 75 minutes  Time spent includes: Detailed review of medical records (labs, imaging, vital signs), medically appropriate exam (mental status, respiratory, cardiac, skin), discussed with treatment team, counseling and educating patient, family and staff, documenting clinical information, medication management and coordination of care.  Signed by: Georgiann Cocker, DNP, FNP-BC Palliative Medicine   Please contact Palliative  Medicine Team providers via [facility] for questions and concerns.

## 2023-01-19 NOTE — Hospital Course (Signed)
HPI: 42yo with h/o asthma/COPD on chronic steroids and 2L home O2, DM, and HTN who presented on 12/4 with worsening LBP and SOB.   Hospital course / significant events:  Imaging concerning for left iliac DVT.  Started on Heparin.  Underwent venogram on 12/5, no evidence of thrombus, vascular surgery signed off, Heparin discontinued.   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 Blood cultures growing MSSA, started on Cefazolin. ID is consulting, agreed with concern for discitis as the source but MRI is negative for discitis or epidural abscess so source is uncertain, will need hip MRI.  Neurosurgery consulted, nonoperative management, pain control (will ask palliative care to help).  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 Negative scrotal US. Unclear source bacteremia - TEE delayed d/t (+)COVID, will need 4-6 weeks abx total, PICC is in place   Consultants:  Vascular Surgery Infectious Disease Neurosurgery Palliative Care  Pulmonology   Procedures/Surgeries: LLE venogram 12/5  PICC placement 12/8  Antibiotics: Ceftriaxone x 1 Vancomycin x 1 Cefazolin 12/5-now     ASSESSMENT & PLAN:   MSSA Bacteremia Patient with h/o MRSA septic bursitis presenting with back pain. CT showed L4-S1 osteo/discitis but MRI (without contrast) did not show this definitively and MRI with contrast ruled this out as source neg TB Ruled out HIV Neurosurgery consulted and thinks intervention would not likely be needed Will need antibiotics for 4-6 weeks. PICC line placed - currently on Cefazolin With bacteremia, ID consulted  Echo unremarkable, plan for TEE with [facility] Thursday, 12/12 - which may need to be canceled due to COVID infection. If TEE cannot be done due to respiratory status, he will need a minimum of 4 weeks of IV Cefazolin per ID   Abnormal imaging  There was concern for L iliac DVT, although venogram was negative so heparin  stopped and vascular signed off  Lung nodule Pulmonary consulted Reimage post treatment  Legionella, strep pneumo, histoplasma, sputum respiratory cultures  Continue abx  Decadron 8 mg bid  Trend CRP  COPD/asthma exacerbation with COVID-19 infection Tested positive for COVID-19 infection on 12/10 Patient with persistent asthma and COPD on chronic steroids and chronic 2 L [place] at home Last saw pulm Dr. Meredeth Ide in 09/2022 Continue supplemental oxygen Continue steroid burst (also on chronic steroids at home) Resume home Incruse Ellipta and Singulair Continue bronchodilators On Dupixent every 2 weeks - hold next dose as this can exacerbate wheezing Added IS and flutter valve Fortunately, he currently has mild nasal congestion and persistent wheezing but does not appear to be seriously ill   Acute on chronic low back pain / groin pain attributed to: Moderate Spinal stenosis w/ L3-4 and L4-5 disc bulging  L femoral head osteonecrosis and tendinosis L hip labral teat  MRI without evidence of discitis or epidural abscess, no other obvious source bacteremia, Scrotal US negative Neurosurgery consulted --> no intervention  Palliative care is assisting with pain control He has chronic ambulatory dysfunction and now acute worsening; he may benefit from CIR placement pending progress Added Celebrex for further pain control Pain control with PO Dilaudid TID + Oxy prn breakthrough   DM New onset, A1c 6.8 May be related to steroid-induced hyperglycemia added moderate-scale SSI (patient is declining - will d/c orders)   HTN Resume home HCTZ and losartan   GERD Protonix 40 mg daily (in lieu of home omeprazole)   Depression Patient and wife report long-standing depression He has rage issues and  is reluctant to try SSRI Given his chronic pain, he is willing to trial Duloxetine   Class 1 Obesity Body mass index is 34.96 kg/m.Marland Kitchen  Weight loss should be encouraged Outpatient PCP/bariatric  medicine f/u encouraged   Nutrition Problem: Inadequate oral intake Etiology: poor appetite Signs/Symptoms: per patient/family report Interventions: Ensure Enlive (each supplement provides 350kcal and 20 grams of protein), MVI, Liberalize Diet      obese based on BMI: Body mass index is 34.96 kg/m.  Underweight - under 18.5  normal weight - 18.5 to 24.9 overweight - 25 to 29.9 obese - 30 or more   DVT prophylaxis: lovenox IV fluids: no continuous IV fluids  Nutrition: regular  Central lines / invasive devices: PICC line   Code Status: FULL CODE ACP documentation reviewed:  none on file in [facility]  TOC needs: home health, pt has declined CIR  Barriers to dispo / significant pending items: outpatient abx

## 2023-01-19 NOTE — Plan of Care (Signed)

## 2023-01-19 NOTE — Consult Note (Addendum)
Consult requested by:  Dr. Ophelia Charter  Consult requested for:  Question of discitis / lower back pain  Primary Physician:  Wilford Corner, [name]  History of Present Illness: 01/19/2023 Joseph [name] is here today with a chief complaint of worsening lower back pain and shortness of breath.  He presented to the hospital and was found to have changes on his lower back imaging.  Blood cultures revealed MSSA bacteremia.  He has been having incapacitating pain.  He has been on a significant pain regimen without substantial improvement.  There was also question of thrombophlebitis.  Vascular angiogram did not show obvious clot formation.  Dr. Rivka Safer is seeing him for his infection.  He is on broad-spectrum antibiotics.  He has some chronic left leg numbness, but no new findings.  Most of his pain in his left leg is around his left hip.  He has no bowel or bladder dysfunction.  Review of Systems:  A 10 point review of systems is negative, except for the pertinent positives and negatives detailed in the HPI.  Past Medical History: Past Medical History:  Diagnosis Date   Asthma    COPD (chronic obstructive pulmonary disease) (HCC)    Eczema    Hypertension     Past Surgical History: Past Surgical History:  Procedure Laterality Date   ABDOMINAL AORTOGRAM W/LOWER EXTREMITY Left 01/18/2023   Procedure: ABDOMINAL AORTOGRAM W/LOWER EXTREMITY;  Surgeon: Renford Dills, MD;  Location: [facility] INVASIVE CV LAB;  Service: Cardiovascular;  Laterality: Left;   OLECRANON BURSECTOMY Right 03/16/2022   Procedure: I&D OLECRANON (ELBOW) BURSA;  Surgeon: Signa Kell, MD;  Location: [facility] ORS;  Service: Orthopedics;  Laterality: Right;    Allergies: Allergies as of 01/17/2023 - Review Complete 01/17/2023  Allergen Reaction Noted   Penicillins Shortness Of Breath 03/27/2017   Pork-derived products Nausea Only 07/01/2014   Tomato Rash 01/17/2023   Banana Hives 03/27/2017   Coconut  (cocos nucifera) Hives 03/15/2022    Medications: Current Meds  Medication Sig   albuterol (PROVENTIL HFA;VENTOLIN HFA) 108 (90 Base) MCG/ACT inhaler Inhale 2 puffs into the lungs every 6 (six) hours as needed for wheezing or shortness of breath.   albuterol (PROVENTIL) (2.5 MG/3ML) 0.083% nebulizer solution Take 2.5 mg by nebulization every 6 (six) hours as needed for wheezing or shortness of breath.   dupilumab (DUPIXENT) 300 MG/2ML prefilled syringe Inject 300 mg into the skin every 14 (fourteen) days.   furosemide (LASIX) 20 MG tablet Take 20 mg by mouth daily as needed for edema or fluid.   gabapentin (NEURONTIN) 300 MG capsule Take 300 mg by mouth 3 (three) times daily as needed (Neuropathy pain).   hydrochlorothiazide (HYDRODIURIL) 25 MG tablet Take 25 mg by mouth daily.   ibuprofen (ADVIL) 800 MG tablet Take 800 mg by mouth daily as needed for fever, headache, mild pain, moderate pain or cramping.   INCRUSE ELLIPTA 62.5 MCG/ACT AEPB Inhale 1 puff into the lungs daily.   losartan (COZAAR) 100 MG tablet Take 1 tablet (100 mg total) by mouth daily.   montelukast (SINGULAIR) 10 MG tablet Take 10 mg by mouth at bedtime.   omeprazole (PRILOSEC) 20 MG capsule Take 20 mg by mouth daily as needed (GERD symptoms).   predniSONE (DELTASONE) 2.5 MG tablet Take 7.5 mg by mouth daily.   tiZANidine (ZANAFLEX) 2 MG tablet Take 2 mg by mouth 3 (three) times daily.    Social History: Social History   Tobacco Use   Smoking status:  Every Day    Current packs/day: 0.50    Types: Cigarettes   Smokeless tobacco: Never  Substance Use Topics   Alcohol use: No   Drug use: Yes    Types: Marijuana    Family Medical History: Family History  Problem Relation Age of Onset   Hypertension Mother     Physical Examination: Vitals:   01/18/23 1359 01/18/23 1458  BP: (!) 132/92 105/71  Pulse:  (!) 103  Resp:  18  Temp:  97.8 F (36.6 C)  SpO2:  98%    General: Patient is in moderate apparent  distress. Attention to examination is appropriate.  Neck:   Supple.  Full range of motion.  Respiratory: Patient is on oxygen   NEUROLOGICAL:     Awake, alert, oriented.  Speech is clear and fluent.  Cranial Nerves: Pupils equal round and reactive to light.  Facial tone is symmetric.  Facial sensation is symmetric. Shoulder shrug is symmetric. Tongue protrusion is midline.    Strength: MUE well  Side Iliopsoas Quads Hamstring PF DF EHL  R 5 5 5 5 5 5   L 5 5 5 5 5 5    Bilateral upper and lower extremity sensation is intact to light touch.    No evidence of dysmetria noted.  Gait is untested.    +TTP around L hip. +FABER on L   Medical Decision Making  Imaging: MRI L spine with contrast 01/18/2023 IMPRESSION: No evidence of discitis-osteomyelitis or epidural abscess.     Electronically Signed   By: Deatra Robinson M.D.   On: 01/19/2023 02:07  CT AP 01/17/2023 IMPRESSION: 1. Nonspecific soft tissue stranding in the prevertebral spaces of the L4-L5 and L5-S1 levels with mild endplate irregularity. While nonspecific, findings can [name] seen in the setting of discitis/osteomyelitis. Recommend further evaluation with a contrast-enhanced lumbar spine MRI. An additional less likely differential consideration is possible thrombophlebitis of the adjacent common iliac vein. Recommend attention on MRI. 2. Mild diverticulosis without evidence of diverticulitis.     Electronically Signed   By: Lorenza Cambridge M.D.   On: 01/17/2023 12:37  I have personally reviewed the images and agree with the above interpretation.  Assessment and Plan: Joseph [name] is a pleasant 42 y.o. male with low back pain and left leg pain.  There was question of discitis on a CT scan, but that was not confirmed on MRI scan.  He does not have discitis or epidural abscess.  I would recommend continued oral and IV pain regimen with NSAIDs as appropriate, muscle relaxation, consideration of gabapentin, and  narcotics as needed.  I will defer further workup to Dr. Ophelia Charter and the other consultants on his case.  He may ultimately need outpatient follow-up in the nonoperative clinic such as pain or physiatry.   I have communicated my recommendations to the requesting physician and coordinated care to facilitate these recommendations.     Annalise Mcdiarmid K. Myer Haff MD, [facility] Neurosurgery

## 2023-01-19 NOTE — Evaluation (Signed)
Physical Therapy Evaluation Patient Details Name: Joseph [name] MRN: [id number] DOB: 1980/12/24 Today's Date: 01/19/2023  History of Present Illness  Pt is a 42 y.o. male presenting to hospital 01/17/23 with c/o SOB and worsening LBP.  Imaging initially concerning for deep venous thrombosis of the left iliac vein.  Pt admitted with L4-S1 discitis with MSSA bacteremia, LBP, L AS labral tear, spinal stenosis, COPD/asthma exacerbation, and leukocytosis.  PMH includes h/o chronic hypoxia requiring supplemental O2 at home, COPD, chronic L hip pain secondary to labral tear, R olecranon bursectomy.  Clinical Impression  Prior to recent medical concerns, pt was ambulatory with [facility]; uses 2 L O2 at night and PRN during the day; lives with his wife in 1 level home with 1 STE from side door.  Pt in moderate 10/10 LBP beginning/end of session at rest (pt reports this 10/10 pain is better than this morning's 10/10 pain)--pt pre-medicated with pain meds; pt reports Lidocaine patch was helping with his pain; nurse updated on pt's status.  Currently pt is SBA to CGA with bed mobility via logrolling; CGA to stand from bed (pt vocalizing in pain when standing and using extra effort and momentum to perform); and CGA to take a few steps forwards/backwards with RW use (limited d/t LBP; significant UE support through RW noted with standing activities).  Despite pain, pt appearing very motivated to participate in session and appeared excited that he did better than this morning.  Pt would currently benefit from skilled PT to address noted impairments and functional limitations (see below for any additional details).  Upon hospital discharge, pt would benefit from ongoing therapy.     If plan is discharge home, recommend the following: A little help with walking and/or transfers;A little help with bathing/dressing/bathroom;Assistance with cooking/housework;Assist for transportation;Help with stairs or ramp for entrance   Can  travel by private vehicle        Equipment Recommendations Rolling walker (2 wheels);Wheelchair (measurements PT);Wheelchair cushion (measurements PT);BSC/3in1  Recommendations for Other Services       Functional Status Assessment Patient has had a recent decline in their functional status and demonstrates the ability to make significant improvements in function in a reasonable and predictable amount of time.     Precautions / Restrictions Precautions Precautions: Fall Restrictions Weight Bearing Restrictions: No      Mobility  Bed Mobility Overal bed mobility: Needs Assistance Bed Mobility: Rolling, Sidelying to Sit, Sit to Sidelying Rolling: Modified independent (Device/Increase time) Sidelying to sit: Supervision     Sit to sidelying: Contact guard assist General bed mobility comments: increased effort to perform on own; use of momentum; SBA to CGA for safety    Transfers Overall transfer level: Needs assistance Equipment used: Rolling walker (2 wheels) Transfers: Sit to/from Stand Sit to Stand: Contact guard assist           General transfer comment: increased effort and use of momentum to stand    Ambulation/Gait Ambulation/Gait assistance: Contact guard assist Gait Distance (Feet):  (pt able to take a few steps forwards/backwards with RW use) Assistive device: Rolling walker (2 wheels)   Gait velocity: decreased     General Gait Details: heavy UE support through RW; mild flexed posture in general (with mildly flexed knees); increased effort/time to take steps  Stairs            Wheelchair Mobility     Tilt Bed    Modified Rankin (Stroke Patients Only)       Balance Overall balance  assessment: Needs assistance Sitting-balance support: Bilateral upper extremity supported, Feet supported Sitting balance-Leahy Scale: Fair Sitting balance - Comments: steady static sitting (heavy UE support through B UE's in sitting; pt initially arching his  back when sitting up with his hands supporting him from behind but then walked his hands forward for more of a neutral upright posture--pt reports doing this because of the pain)   Standing balance support: Bilateral upper extremity supported Standing balance-Leahy Scale: Fair Standing balance comment: heavy UE support through RW static standing                             Pertinent Vitals/Pain Pain Assessment Pain Assessment: 0-10 Pain Score: 10-Worst pain ever ("moderate" 10/10) Pain Location: LBP Pain Descriptors / Indicators: Aching, Constant, Discomfort, Grimacing, Guarding, Sharp, Shooting Pain Intervention(s): Limited activity within patient's tolerance, Monitored during session, Premedicated before session, Repositioned, Other (comment) (RN notified regarding pt's pain) HR around 107 bpm at rest and increased up to 139 bpm with activity (nurse notified).    Home Living Family/patient expects to be discharged to:: Private residence Living Arrangements: Spouse/significant other Available Help at Discharge: Family;Available 24 hours/day Type of Home: House Home Access: Stairs to enter Entrance Stairs-Rails: None Entrance Stairs-Number of Steps: 1 (side door)   Home Layout: One level Home Equipment: Cane - single point      Prior Function Prior Level of Function : Independent/Modified Independent             Mobility Comments: Pt ambulatory with SPC.  Uses 2 L home O2 at night and PRN during the day. ADLs Comments: Per OT eval "Pt reports IND and needing increased time with all ADL's."     Extremity/Trunk Assessment   Upper Extremity Assessment Upper Extremity Assessment: Defer to OT evaluation    Lower Extremity Assessment Lower Extremity Assessment: Generalized weakness       Communication   Communication Communication: No apparent difficulties Cueing Techniques: Verbal cues  Cognition Arousal: Alert Behavior During Therapy: WFL for tasks  assessed/performed Overall Cognitive Status: Within Functional Limits for tasks assessed                                          General Comments  Nursing cleared pt for participation in physical therapy.  Pt agreeable to PT session.    Exercises     Assessment/Plan    PT Assessment Patient needs continued PT services  PT Problem List Decreased strength;Decreased activity tolerance;Decreased balance;Decreased mobility;Decreased knowledge of use of DME;Pain       PT Treatment Interventions DME instruction;Gait training;Stair training;Functional mobility training;Therapeutic activities;Therapeutic exercise;Balance training;Patient/family education    PT Goals (Current goals can be found in the Care Plan section)  Acute Rehab PT Goals Patient Stated Goal: to improve pain and functional mobility PT Goal Formulation: With patient Time For Goal Achievement: 02/02/23 Potential to Achieve Goals: Good    Frequency Min 1X/week     Co-evaluation               AM-PAC PT "6 Clicks" Mobility  Outcome Measure Help needed turning from your back to your side while in a flat bed without using bedrails?: None Help needed moving from lying on your back to sitting on the side of a flat bed without using bedrails?: A Little Help needed moving to and from a  bed to a chair (including a wheelchair)?: A Little Help needed standing up from a chair using your arms (e.g., wheelchair or bedside chair)?: A Little Help needed to walk in hospital room?: A Lot Help needed climbing 3-5 steps with a railing? : A Lot 6 Click Score: 17    End of Session Equipment Utilized During Treatment: Gait belt Activity Tolerance: Patient limited by pain Patient left: in bed;with call bell/phone within reach;with bed alarm set Nurse Communication: Mobility status;Precautions;Other (comment) (Pt's pain status) PT Visit Diagnosis: Other abnormalities of gait and mobility (R26.89);Muscle weakness  (generalized) (M62.81);Pain Pain - part of body:  (LBP)    Time: 4034-7425 PT Time Calculation (min) (ACUTE ONLY): 15 min   Charges:   PT Evaluation $PT Eval Low Complexity: 1 Low   PT General Charges $$ ACUTE PT VISIT: 1 Visit        Hendricks Limes, PT 01/19/23, 4:40 PM

## 2023-01-19 NOTE — Progress Notes (Signed)
*  PRELIMINARY RESULTS* Echocardiogram 2D Echocardiogram has been performed.  Cristela Blue 01/19/2023, 9:54 AM
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
## 2023-01-19 NOTE — Progress Notes (Signed)
Progress Note   [name] WJX:[id number] DOB: [birth year] DOA: 01/17/2023     2 DOS: the patient was seen and examined on 01/19/2023   Brief hospital course: 42yo with h/o asthma/COPD on chronic steroids and 2L home O2, DM, and HTN who presented on 12/4 with worsening LBP and SOB.  Imaging concerning for L4-S1 osteomyelitis (CT but not MRI) as well as a left iliac DVT.  Started on Heparin.  Blood cultures growing MSSA, started on Cefazolin. Underwent venogram on 12/5, no evidence of thrombus, vascular surgery signed off, Heparin discontinued.  ID is consulting, agreed with concern for discitis as the source but MRI is negative for discitis or epidural abscess so source is uncertain, will need hip MRI.  Neurosurgery consulted, nonoperative management, pain control (will ask palliative care to help).    Assessment and Plan:  MSSA Bacteremia Patient with h/o MRSA septic bursitis presenting with back pain. CT showed L4-S1 osteo/discitis but MRI (without contrast) did not show this definitively and MRI with contrast ruled this out as source There was concern for L iliac DVT, although venogram was negative so heparin stopped and vascular signed off Neurosurgery consulted and thinks intervention would not likely be needed Will need antibiotics for 4-6 weeks - currently on Cefazolin With vertebral osteo, needs testing for TB; ordered quantiferon gold and HIV is negative Further important considerations include nutrition (will order consult), diabetes control (no h/o, A1c 6.8), tobacco cessation (see below).  With bacteremia, will need ID consult - Dr. Rivka Safer is planning to see and has seen him before Echo pending, likely to need TEE - [facility] is arranging for Monday, 12/9   Left AS labral tear/Spinal stenosis Patient with a history of chronic low back pain followed by [facility] orthopedic surgery now presenting with worsening low back pain and L hip pain  X-ray of the low back  shows multilevel degenerative changes of the L-spine, worse at L3-4.  MRI L-spine shows moderate L3-4 and L4-5 disc bulging with associated moderate stenosis without evidence of discitis or epidural abscess Neurosurgery consulted, appreciate recs Oxy 5-10 mg q4h prn for moderate pain  IV Dilaudid 1 mg q2h prn for severe pain Tizanidine 2 mg 3 times daily Gabapentin 300 mg 3 times daily as needed for neuropathic pain He has chronic ambulatory dysfunction and now acute worsening; he may benefit from CIR placement pending progress Palliative care consulted for pain management assistance Complained of L hip and scrotal pain today - negative testicular exam but MRI of hip + scrotal US ordered   COPD/asthma exacerbation Patient with persistent asthma and COPD on chronic steroids and chronic 2 L [place] at home Last saw pulm Dr. Meredeth Ide in 09/2022 Lung auscultation reveals diffuse expiratory wheezes Status post IV Solu-Medrol and DuoNeb x 1 in the ED Continue supplemental oxygen Prednisone 40 mg daily x 2 days and then resume home dose of 7.5 mg daily Resume home Incruse Ellipta and Singulair Standing Duonebs and as needed prednisone for SOB and wheezing Continue Dupixent every 2 weeks  DM New onset, A1c 6.8 May be related to steroid-induced hyperglycemia Will add moderate-scale SSI   HTN Resume home HCTZ and losartan   GERD Protonix 40 mg daily (in lieu of home omeprazole)  Depression Patient and wife report long-standing depression He has rage issues and is reluctant to try SSRI Given his chronic pain, he is willing to trial Duloxetine   Class 1 Obesity Body mass index is 34.96 kg/m.Marland Kitchen  Weight loss should be  encouraged Outpatient PCP/bariatric medicine f/u encouraged          Consultants: Vascular surgery ID Neurosurgery  Palliative care OT PT TOC team   Procedures: LLE venogram 12/5   Antibiotics: Ceftriaxone x 1 Vancomycin x 1 Cefazolin 12/5-  30 Day Unplanned  Readmission Risk Score    Flowsheet Row ED to Hosp-Admission (Current) from 01/17/2023 in [facility] (1A)  30 Day Unplanned Readmission Risk Score (%) 9.69 Filed at 01/19/2023 0401       This score is the patient's risk of an unplanned readmission within 30 days of being discharged (0 -100%). The score is based on dignosis, age, lab data, medications, orders, and past utilization.   Low:  0-14.9   Medium: 15-21.9   High: 22-29.9   Extreme: 30 and above           Subjective: Excruciating pain in low back, L hip, radiating into L testicle that is not improved with current treatment.   Objective: Vitals:   01/19/23 0747 01/19/23 0936  BP: 123/86   Pulse: (!) 106   Resp: 16   Temp: 98.5 F (36.9 C)   SpO2: 97% 90%    Intake/Output Summary (Last 24 hours) at 01/19/2023 1534 Last data filed at 01/19/2023 1051 Gross per 24 hour  Intake 300 ml  Output 350 ml  Net -50 ml   Filed Weights   01/17/23 0912 01/18/23 0947  Weight: 81.2 kg 81.2 kg    Exam:  General:  Appears uncomfortable but not in distress Eyes:  EOMI, normal lids, iris ENT:  grossly normal hearing, lips & tongue, mmm Neck:  no LAD, masses or thyromegaly Cardiovascular:  RRR, no m/r/g. No LE edema.  Respiratory:   CTA bilaterally with no wheezes/rales/rhonchi.  Normal respiratory effort. Abdomen:  soft, NT, ND Testicle:   small subQ nodules x 2 on scrotal sac without obvious L testicular mass or marked TTP; no apparent inguinal hernia on exam Skin:  no rash or induration seen on limited exam Musculoskeletal:  grossly normal tone BUE/BLE, no bony abnormality Psychiatric:  blunted mood and affect, speech fluent and appropriate, AOx3 Neurologic:  CN 2-12 grossly intact, moves all extremities in coordinated fashion  Data Reviewed: I have reviewed the patient's lab results since admission.  Pertinent labs for today include:  WBC 21.9, down from 32.4 Hgb 12.6 Peripheral smear  pending    Family Communication: Wife was present throughout evaluation  Disposition: Status is: Inpatient Remains inpatient appropriate because: ongoing evaluation and management     Time spent: 50 minutes  Unresulted Labs (From admission, onward)     Start     Ordered   01/20/23 0500  CBC with Differential/Platelet  Tomorrow morning,   R        01/19/23 1534   01/20/23 0500  Basic metabolic panel  Tomorrow morning,   R        01/19/23 1534   01/19/23 0500  Culture, blood (Routine X 2) w Reflex to ID Panel  BLOOD CULTURE X 2,   TIMED      01/18/23 1636   01/19/23 0500  QuantiFERON-TB Gold Plus  Tomorrow morning,   R        01/18/23 1706             Author: [name], MD 01/19/2023 3:34 PM  For on call review [email or web address].

## 2023-01-19 NOTE — Progress Notes (Signed)
Date of Admission:  01/17/2023      ID: Joseph [name] is a 42 y.o. male Principal Problem:   MSSA bacteremia Active Problems:   Chronic respiratory failure (HCC)   Essential hypertension   Severe persistent asthma   Acute midline low back pain without sciatica   Degeneration of intervertebral disc of lumbar region with discogenic back pain    Subjective: Pt says he is okay Back pain stable Underwent venogram today  Medications:   DULoxetine  20 mg Oral Daily   enoxaparin (LOVENOX) injection  40 mg Subcutaneous QHS   feeding supplement  237 mL Oral TID BM   gabapentin  300 mg Oral TID   hydrochlorothiazide  25 mg Oral Daily   HYDROmorphone  2 mg Oral TID   insulin aspart  0-15 Units Subcutaneous TID WC   lidocaine  1 patch Transdermal Q24H   losartan  100 mg Oral Daily   montelukast  10 mg Oral QHS   multivitamin with minerals  1 tablet Oral Daily   pantoprazole  40 mg Oral Daily   [START ON 01/20/2023] predniSONE  7.5 mg Oral Q breakfast   QUEtiapine  25 mg Oral QHS   tiZANidine  2 mg Oral TID   umeclidinium bromide  1 puff Inhalation Daily    Objective: Vital signs in last 24 hours: Patient Vitals for the past 24 hrs:  BP Temp Pulse Resp SpO2  01/19/23 1537 (!) 137/92 99.1 F (37.3 C) -- 18 97 %  01/19/23 0936 -- -- -- -- 90 %  01/19/23 0747 123/86 98.5 F (36.9 C) (!) 106 16 97 %     LDA Foley Central lines Other catheters  PHYSICAL EXAM:  General: Alert, cooperative, no distress, appears stated age.  Moon faced Back: No CVA tenderness. Lungs: Clear to auscultation bilaterally. No Wheezing or Rhonchi. No rales. Heart: Regular rate and rhythm, no murmur, rub or gallop. Abdomen: Soft,  distended. Bowel sounds normal. No masses, striae Extremities: atraumatic, no cyanosis. No edema. No clubbing Scar rt elbow Skin: No rashes or lesions. Or bruising Lymph: Cervical, supraclavicular normal. Neurologic: Grossly non-focal  Lab Results    Latest Ref  Rng & Units 01/19/2023    5:27 AM 01/18/2023    5:05 AM 01/17/2023    9:17 AM  CBC  WBC 4.0 - 10.5 K/uL 4.0 - 10.5 K/uL 21.9    21.5  32.4  31.4   Hemoglobin 13.0 - 17.0 g/dL 40.9 - 81.1 g/dL 91.4    78.2  95.6  21.3   Hematocrit 39.0 - 52.0 % 39.0 - 52.0 % 40.0    40.4  37.5  47.8   Platelets 150 - 400 K/uL 150 - 400 K/uL 369    380  343  409        Latest Ref Rng & Units 01/18/2023    5:05 AM 01/17/2023    9:17 AM 03/18/2022    6:19 AM  CMP  Glucose 70 - 99 mg/dL 086  578    BUN 6 - 20 mg/dL 24  17    Creatinine 4.69 - 1.24 mg/dL 6.29  5.28  4.13   Sodium 135 - 145 mmol/L 135  136    Potassium 3.5 - 5.1 mmol/L 3.7  3.5    Chloride 98 - 111 mmol/L 99  95    CO2 22 - 32 mmol/L 28  31    Calcium 8.9 - 10.3 mg/dL 8.0  9.0  Microbiology: [facility] 4/4 MSSA Studies/Results: ECHOCARDIOGRAM COMPLETE  Result Date: 01/19/2023    ECHOCARDIOGRAM REPORT   Patient Name:   Joseph [name] Date of Exam: 01/19/2023 Medical Rec #:  [id number]       Height:       60.0 in Accession #:    [id number]      Weight:       179.0 lb Date of Birth:  [birth year]        BSA:          1.781 m Patient Age:    42 years        BP:           123/86 mmHg Patient Gender: M               HR:           106 bpm. Exam Location:  [facility] Procedure: 2D Echo, Cardiac Doppler and Color Doppler Indications:     Bacteremia R78.81  History:         Patient has prior history of Echocardiogram examinations, most                  recent 08/07/2021. COPD; Risk Factors:Hypertension.  Sonographer:     Cristela Blue Referring Phys:  [id number] JENNIFER YATES Diagnosing Phys: Rozell Searing Custovic IMPRESSIONS  1. Left ventricular ejection fraction, by estimation, is 60 to 65%. The left ventricle has normal function. The left ventricle has no regional wall motion abnormalities. Left ventricular diastolic parameters were normal.  2. Right ventricular systolic function is normal. The right ventricular size is normal.  3. The mitral valve is normal in structure.  No evidence of mitral valve regurgitation. No evidence of mitral stenosis.  4. The aortic valve is normal in structure. Aortic valve regurgitation is not visualized. No aortic stenosis is present. Conclusion(s)/Recommendation(s): No evidence of valvular vegetations on this transthoracic echocardiogram. Consider a transesophageal echocardiogram to exclude infective endocarditis if clinically indicated. FINDINGS  Left Ventricle: Left ventricular ejection fraction, by estimation, is 60 to 65%. The left ventricle has normal function. The left ventricle has no regional wall motion abnormalities. The left ventricular internal cavity size was normal in size. There is  no left ventricular hypertrophy. Left ventricular diastolic parameters were normal. Right Ventricle: The right ventricular size is normal. No increase in right ventricular wall thickness. Right ventricular systolic function is normal. Left Atrium: Left atrial size was normal in size. Right Atrium: Right atrial size was normal in size. Pericardium: There is no evidence of pericardial effusion. Mitral Valve: The mitral valve is normal in structure. No evidence of mitral valve regurgitation. No evidence of mitral valve stenosis. Tricuspid Valve: The tricuspid valve is normal in structure. Tricuspid valve regurgitation is trivial. No evidence of tricuspid stenosis. Aortic Valve: The aortic valve is normal in structure. Aortic valve regurgitation is not visualized. No aortic stenosis is present. Aortic valve mean gradient measures 3.0 mmHg. Aortic valve peak gradient measures 5.2 mmHg. Aortic valve area, by VTI measures 4.25 cm. Pulmonic Valve: The pulmonic valve was normal in structure. Pulmonic valve regurgitation is not visualized. No evidence of pulmonic stenosis. Aorta: The aortic root is normal in size and structure. IAS/Shunts: No atrial level shunt detected by color flow Doppler.  LEFT VENTRICLE PLAX 2D LVIDd:         4.60 cm   Diastology LVIDs:          3.40 cm   LV e' medial:    8.05 cm/s LV PW:  1.00 cm   LV E/e' medial:  11.5 LV IVS:        1.00 cm   LV e' lateral:   9.46 cm/s LVOT diam:     2.20 cm   LV E/e' lateral: 9.8 LV SV:         72 LV SV Index:   40 LVOT Area:     3.80 cm  RIGHT VENTRICLE RV Basal diam:  4.20 cm RV Mid diam:    3.60 cm LEFT ATRIUM           Index        RIGHT ATRIUM           Index LA diam:      2.40 cm 1.35 cm/m   RA Area:     15.80 cm LA Vol (A2C): 32.7 ml 18.36 ml/m  RA Volume:   42.60 ml  23.92 ml/m LA Vol (A4C): 33.1 ml 18.59 ml/m  AORTIC VALVE AV Area (Vmax):    3.50 cm AV Area (Vmean):   3.44 cm AV Area (VTI):     4.25 cm AV Vmax:           114.00 cm/s AV Vmean:          82.000 cm/s AV VTI:            0.169 m AV Peak Grad:      5.2 mmHg AV Mean Grad:      3.0 mmHg LVOT Vmax:         105.00 cm/s LVOT Vmean:        74.300 cm/s LVOT VTI:          0.189 m LVOT/AV VTI ratio: 1.12  AORTA Ao Root diam: 3.40 cm MITRAL VALVE                TRICUSPID VALVE MV Area (PHT): 9.03 cm     TR Peak grad:   11.8 mmHg MV Decel Time: 84 msec      TR Vmax:        172.00 cm/s MV E velocity: 92.80 cm/s MV A velocity: 116.00 cm/s  SHUNTS MV E/A ratio:  0.80         Systemic VTI:  0.19 m                             Systemic Diam: 2.20 cm Rozell Searing Custovic Electronically signed by Clotilde Dieter Signature Date/Time: 01/19/2023/1:17:37 PM    Final    MR LUMBAR SPINE W CONTRAST  Result Date: 01/19/2023 CLINICAL DATA:  Low back pain EXAM: MRI LUMBAR SPINE WITH CONTRAST TECHNIQUE: Multiplanar and multiecho pulse sequences of the lumbar spine were obtained with intravenous contrast. CONTRAST:  8mL GADAVIST GADOBUTROL 1 MMOL/ML IV SOLN COMPARISON:  None Available. FINDINGS: Postcontrast T1-weighted imaging was performed as an adjunct to the earlier noncontrast study (01/17/2023). There is no evidence of discitis-osteomyelitis. There is no epidural abscess. Mild enhancement along the caudal migrated aspect the L5-S1 disc extrusion is favored to  be reactive. IMPRESSION: No evidence of discitis-osteomyelitis or epidural abscess. Electronically Signed   By: Deatra Robinson M.D.   On: 01/19/2023 02:07   PERIPHERAL VASCULAR CATHETERIZATION  Result Date: 01/18/2023 See surgical note for result.  US Venous Img Lower Bilateral (DVT)  Result Date: 01/18/2023 CLINICAL DATA:  Bilateral lower extremity pain. EXAM: BILATERAL LOWER EXTREMITY VENOUS DOPPLER ULTRASOUND TECHNIQUE: Gray-scale sonography with graded compression, as well as color Doppler and  duplex ultrasound were performed to evaluate the lower extremity deep venous systems from the level of the common femoral vein and including the common femoral, femoral, profunda femoral, popliteal and calf veins including the posterior tibial, peroneal and gastrocnemius veins when visible. The superficial great saphenous vein was also interrogated. Spectral Doppler was utilized to evaluate flow at rest and with distal augmentation maneuvers in the common femoral, femoral and popliteal veins. COMPARISON:  August 29, 2021 FINDINGS: RIGHT LOWER EXTREMITY Common Femoral Vein: No evidence of thrombus. Normal compressibility, respiratory phasicity and response to augmentation. Saphenofemoral Junction: No evidence of thrombus. Normal compressibility and flow on color Doppler imaging. Profunda Femoral Vein: No evidence of thrombus. Normal compressibility and flow on color Doppler imaging. Femoral Vein: No evidence of thrombus. Normal compressibility, respiratory phasicity and response to augmentation. Popliteal Vein: No evidence of thrombus. Normal compressibility, respiratory phasicity and response to augmentation. Calf Veins: No evidence of thrombus. Normal compressibility and flow on color Doppler imaging. Superficial Great Saphenous Vein: No evidence of thrombus. Normal compressibility. Venous Reflux:  None. Other Findings:  None. LEFT LOWER EXTREMITY Common Femoral Vein: No evidence of thrombus. Normal compressibility,  respiratory phasicity and response to augmentation. Saphenofemoral Junction: No evidence of thrombus. Normal compressibility and flow on color Doppler imaging. Profunda Femoral Vein: No evidence of thrombus. Normal compressibility and flow on color Doppler imaging. Femoral Vein: No evidence of thrombus. Normal compressibility, respiratory phasicity and response to augmentation. Popliteal Vein: No evidence of thrombus. Normal compressibility, respiratory phasicity and response to augmentation. Calf Veins: No evidence of thrombus. Normal compressibility and flow on color Doppler imaging. Superficial Great Saphenous Vein: No evidence of thrombus. Normal compressibility. Venous Reflux:  None. Other Findings:  None. IMPRESSION: No evidence of deep venous thrombosis in either lower extremity. Electronically Signed   By: Aram Candela M.D.   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

## 2023-01-19 NOTE — Evaluation (Signed)
Occupational Therapy Evaluation Patient Details Name: Joseph [name] MRN: [id number] DOB: 03-22-1980 Today's Date: 01/19/2023   History of Present Illness 42yo with h/o asthma/COPD on chronic steroids and 2L home O2, DM, and HTN who presented on 12/4 with worsening LBP and SOB.  Imaging concerning for L4-S1 osteomyelitis (CT but not MRI) as well as a left iliac DVT (negative).  Blood cultures growing MSSA, started on Cefazolin.   Clinical Impression   Pt was seen for OT evaluation this date. Prior to hospital admission, pt was IND and taking increased time with all ADL's. Pt lives with wife who is able to help as needed. Pt presents to acute OT demonstrating impaired ADL performance and functional mobility 2/2 (See OT problem list for additional functional deficits). Upon arrival to room pt supine in bed, agreeable to tx. Pt reported being in lots of pain but willing to get in the recliner during session. Pt rolled in bed and sat up with mod A +use of rails. Pt attempted to sit up at the EOB x2; on first attempt pt quickly laid back down d/t pain. Pt requested scooting in recliner, pt completed scooting in chair with CGA-Min A and requested getting back in bed. Pt crying and yelling in pain. Pt required Mod-Max A sit>supine, fluctuating A levels. Pt reported 10/10 pain in back area and requested pain meds, RN notified. Pt left supine in bed with call bell within reach and RN in room. Pt would benefit from skilled OT services to address noted impairments and functional limitations (see below for any additional details) in order to maximize safety and independence while minimizing falls risk and caregiver burden. Anticipate the need for follow up OT services upon acute hospital DC.        If plan is discharge home, recommend the following: A little help with walking and/or transfers;A little help with bathing/dressing/bathroom;Assistance with cooking/housework;Assist for transportation;Help with stairs or  ramp for entrance    Functional Status Assessment  Patient has had a recent decline in their functional status and demonstrates the ability to make significant improvements in function in a reasonable and predictable amount of time.  Equipment Recommendations  Hospital bed (Defer)    Recommendations for Other Services       Precautions / Restrictions Restrictions Weight Bearing Restrictions: No      Mobility Bed Mobility Overal bed mobility: Needs Assistance Bed Mobility: Rolling Rolling: Mod assist, Max assist           Patient Response: Cooperative  Transfers Overall transfer level: Needs assistance   Transfers: Bed to chair/wheelchair/BSC            Lateral/Scoot Transfers: Contact guard assist, Min assist        Balance Overall balance assessment: Needs assistance Sitting-balance support: Single extremity supported, Feet supported Sitting balance-Leahy Scale: Poor                                     ADL either performed or assessed with clinical judgement   ADL Overall ADL's : Needs assistance/impaired                                     Functional mobility during ADLs: Moderate assistance;Contact guard assist General ADL Comments: Pt reported being in lots of pain but willing to get in the recliner during session. Pt  rolled in bed and sat up with mod A +use of rails. Pt attempted to sit up at the EOB x2; on first attempt pt quickly laid back down d/t pain. Pt requested scooting in recliner, pt completed scooting in chair with CGA-Min A and requested getting back in bed. Pt reported 10/10 pain in back area and requested pain meds, RN notified.     Vision         Perception         Praxis         Pertinent Vitals/Pain Pain Assessment Pain Assessment: 0-10 Pain Score: 10-Worst pain ever Pain Location: back area Pain Descriptors / Indicators: Aching, Crying, Discomfort, Grimacing, Radiating, Sharp, Shooting Pain  Intervention(s): Patient requesting pain meds-RN notified, Monitored during session, Limited activity within patient's tolerance     Extremity/Trunk Assessment Upper Extremity Assessment Upper Extremity Assessment: Overall WFL for tasks assessed   Lower Extremity Assessment Lower Extremity Assessment: Generalized weakness       Communication Communication Communication: No apparent difficulties Cueing Techniques: Verbal cues   Cognition Arousal: Alert Behavior During Therapy: WFL for tasks assessed/performed Overall Cognitive Status: Within Functional Limits for tasks assessed                                       General Comments       Exercises     Shoulder Instructions      Home Living Family/patient expects to be discharged to:: Private residence Living Arrangements: Spouse/significant other Available Help at Discharge: Family;Available 24 hours/day Type of Home: House Home Access: Stairs to enter [employer] of Steps: 1-2   Home Layout: One level               Home Equipment: Cane - single point          Prior Functioning/Environment Prior Level of Function : Independent/Modified Independent             Mobility Comments: Pt uses a cane intermittent when not feeling well ADLs Comments: Pt reports IND and needing increased time with all ADL's.        OT Problem List: Decreased strength;Decreased range of motion;Decreased activity tolerance;Impaired balance (sitting and/or standing);Pain      OT Treatment/Interventions: Self-care/ADL training;Therapeutic exercise;Therapeutic activities;Energy conservation;Patient/family education    OT Goals(Current goals can be found in the care plan section) Acute Rehab OT Goals Patient Stated Goal: to feel better OT Goal Formulation: With patient/family Time For Goal Achievement: 02/02/23 Potential to Achieve Goals: Good ADL Goals Pt Will Perform Grooming:  sitting;standing;with contact guard assist Pt Will Perform Lower Body Dressing: with mod assist;sitting/lateral leans;sit to/from stand Pt Will Transfer to Toilet: bedside commode;with contact guard assist  OT Frequency: Min 1X/week    Co-evaluation              AM-PAC OT "6 Clicks" Daily Activity     Outcome Measure Help from another person eating meals?: None Help from another person taking care of personal grooming?: A Little Help from another person toileting, which includes using toliet, bedpan, or urinal?: A Lot Help from another person bathing (including washing, rinsing, drying)?: A Lot Help from another person to put on and taking off regular upper body clothing?: A Little Help from another person to put on and taking off regular lower body clothing?: A Lot 6 Click Score: 16   End of Session Nurse Communication: Mobility  status;Patient requests pain meds  Activity Tolerance: Patient limited by pain Patient left: in bed;with call bell/phone within reach;with nursing/sitter in room;with family/visitor present  OT Visit Diagnosis: Other abnormalities of gait and mobility (R26.89);Muscle weakness (generalized) (M62.81)                Time: 6962-9528 OT Time Calculation (min): 17 min Charges:     Butch Penny, SOT

## 2023-01-20 DIAGNOSIS — B9561 Methicillin susceptible Staphylococcus aureus infection as the cause of diseases classified elsewhere: Secondary | ICD-10-CM | POA: Diagnosis not present

## 2023-01-20 DIAGNOSIS — R7881 Bacteremia: Secondary | ICD-10-CM | POA: Diagnosis not present

## 2023-01-20 LAB — CBC WITH DIFFERENTIAL/PLATELET
Abs Immature Granulocytes: 0.15 10*3/uL — ABNORMAL HIGH (ref 0.00–0.07)
Basophils Absolute: 0 10*3/uL (ref 0.0–0.1)
Basophils Relative: 0 %
Eosinophils Absolute: 0 10*3/uL (ref 0.0–0.5)
Eosinophils Relative: 0 %
HCT: 41.5 % (ref 39.0–52.0)
Hemoglobin: 13.1 g/dL (ref 13.0–17.0)
Immature Granulocytes: 1 %
Lymphocytes Relative: 6 %
Lymphs Abs: 1.1 10*3/uL (ref 0.7–4.0)
MCH: 26.4 pg (ref 26.0–34.0)
MCHC: 31.6 g/dL (ref 30.0–36.0)
MCV: 83.7 fL (ref 80.0–100.0)
Monocytes Absolute: 2.1 10*3/uL — ABNORMAL HIGH (ref 0.1–1.0)
Monocytes Relative: 12 %
Neutro Abs: 14.6 10*3/uL — ABNORMAL HIGH (ref 1.7–7.7)
Neutrophils Relative %: 81 %
Platelets: 371 10*3/uL (ref 150–400)
RBC: 4.96 MIL/uL (ref 4.22–5.81)
RDW: 17.8 % — ABNORMAL HIGH (ref 11.5–15.5)
WBC: 18 10*3/uL — ABNORMAL HIGH (ref 4.0–10.5)
nRBC: 0.2 % (ref 0.0–0.2)

## 2023-01-20 LAB — CULTURE, BLOOD (ROUTINE X 2)
Special Requests: ADEQUATE
Special Requests: ADEQUATE

## 2023-01-20 LAB — GLUCOSE, CAPILLARY
Glucose-Capillary: 95 mg/dL (ref 70–99)
Glucose-Capillary: 113 mg/dL — ABNORMAL HIGH (ref 70–99)
Glucose-Capillary: 147 mg/dL — ABNORMAL HIGH (ref 70–99)
Glucose-Capillary: 127 mg/dL — ABNORMAL HIGH (ref 70–99)

## 2023-01-20 LAB — BASIC METABOLIC PANEL
Anion gap: 8 (ref 5–15)
BUN: 18 mg/dL (ref 6–20)
CO2: 31 mmol/L (ref 22–32)
Calcium: 8.5 mg/dL — ABNORMAL LOW (ref 8.9–10.3)
Chloride: 97 mmol/L — ABNORMAL LOW (ref 98–111)
Creatinine, Ser: 0.78 mg/dL (ref 0.61–1.24)
GFR, Estimated: >60 mL/min (ref >60)
Glucose, Bld: 98 mg/dL (ref 70–99)
Potassium: 3.9 mmol/L (ref 3.5–5.1)
Sodium: 136 mmol/L (ref 135–145)

## 2023-01-20 MED ORDER — CELECOXIB 200 MG PO CAPS
200.0000 mg | ORAL_CAPSULE | Freq: Two times a day (BID) | ORAL | Status: DC
  Administered 2023-01-20 – 2023-01-25 (×9): 200 mg via ORAL
  Filled 2023-01-20 (×10): qty 1

## 2023-01-20 NOTE — Plan of Care (Signed)
?  Problem: Education: ?Goal: Knowledge of General Education information will improve ?Description: Including pain rating scale, medication(s)/side effects and non-pharmacologic comfort measures ?Outcome: Progressing ?  ?Problem: Clinical Measurements: ?Goal: Ability to maintain clinical measurements within normal limits will improve ?Outcome: Progressing ?Goal: Respiratory complications will improve ?Outcome: Progressing ?  ?Problem: Nutrition: ?Goal: Adequate nutrition will be maintained ?Outcome: Progressing ?  ?

## 2023-01-20 NOTE — Plan of Care (Signed)
  Problem: Skin Integrity: Goal: Risk for impaired skin integrity will decrease Outcome: Progressing   Problem: Safety: Goal: Ability to remain free from injury will improve Outcome: Progressing   Problem: Pain Management: Goal: General experience of comfort will improve Outcome: Progressing   Problem: Education: Goal: Ability to describe self-care measures that may prevent or decrease complications (Diabetes Survival Skills Education) will improve Outcome: Progressing   Problem: Elimination: Goal: Will not experience complications related to bowel motility Outcome: Progressing   Problem: Coping: Goal: Level of anxiety will decrease Outcome: Progressing

## 2023-01-20 NOTE — Progress Notes (Signed)
Progress Note   [name] JXB:[id number] DOB: [birth year] DOA: 01/17/2023     3 DOS: the patient was seen and examined on 01/20/2023   Brief hospital course: 42yo with h/o asthma/COPD on chronic steroids and 2L home O2, DM, and HTN who presented on 12/4 with worsening LBP and SOB.  Imaging concerning for L4-S1 osteomyelitis (CT but not MRI) as well as a left iliac DVT.  Started on Heparin.  Blood cultures growing MSSA, started on Cefazolin. Underwent venogram on 12/5, no evidence of thrombus, vascular surgery signed off, Heparin discontinued.  ID is consulting, agreed with concern for discitis as the source but MRI is negative for discitis or epidural abscess so source is uncertain, will need hip MRI.  Neurosurgery consulted, nonoperative management, pain control (will ask palliative care to help).  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 Negative scrotal US.  Assessment and Plan:  MSSA Bacteremia Patient with h/o MRSA septic bursitis presenting with back pain. CT showed L4-S1 osteo/discitis but MRI (without contrast) did not show this definitively and MRI with contrast ruled this out as source There was concern for L iliac DVT, although venogram was negative so heparin stopped and vascular signed off Neurosurgery consulted and thinks intervention would not likely be needed Will need antibiotics for 4-6 weeks - currently on Cefazolin With vertebral osteo, needs testing for TB; ordered quantiferon gold and HIV is negative With bacteremia, ID consulted  Echo unremarkable, plan for TEE with [facility] Monday, 12/9 Likely to need PICC line placed Monday Possible dc to home vs. SNF rehab on 12/10   Left AS labral tear/Spinal stenosis Patient with a history of chronic low back pain followed by [facility] orthopedic surgery now presenting with worsening low back pain and L hip pain  X-ray of the low back shows multilevel degenerative changes of  the L-spine, worse at L3-4.  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 Meredeth Ide in 09/2022 Lung auscultation reveals diffuse expiratory wheezes Status post IV Solu-Medrol and DuoNeb x 1 in the ED Continue supplemental oxygen Prednisone 40 mg daily x 2 days and then resume home dose of 7.5 mg daily Resume home Incruse Ellipta and Singulair Standing Duonebs and as needed prednisone for SOB and wheezing Continue Dupixent every 2 weeks Will add IS and flutter valve   DM New onset, A1c 6.8 May be related to steroid-induced hyperglycemia Will add moderate-scale SSI   HTN Resume home HCTZ and losartan   GERD Protonix 40 mg daily (in lieu of home omeprazole)   Depression Patient and wife report long-standing depression He has rage issues and is reluctant to try SSRI Given his chronic pain, he is willing to trial Duloxetine   Class 1 Obesity Body mass index is 34.96 kg/m.Marland Kitchen  Weight loss should be encouraged Outpatient PCP/bariatric medicine f/u encouraged  Nutrition  Problem: Inadequate oral intake Etiology: poor appetite Signs/Symptoms: per patient/family report Interventions: Ensure Enlive (each supplement provides 350kcal and 20 grams of protein), MVI, Liberalize Diet            Consultants: Vascular surgery ID Neurosurgery  Palliative care OT PT TOC team   Procedures: LLE venogram 12/5   Antibiotics: Ceftriaxone x 1 Vancomycin x 1 Cefazolin 12/5-  30 Day Unplanned Readmission Risk Score    Flowsheet Row ED to Hosp-Admission (Current) from 01/17/2023 in [facility] (1A)  30 Day Unplanned Readmission Risk Score (%) 12.2 Filed at 01/20/2023 0400       This score is the patient's risk of an unplanned readmission within 30 days of being discharged (0 -100%). The score is based on dignosis, age, lab data, medications, orders, and past utilization.   Low:  0-14.9   Medium: 15-21.9   High: 22-29.9   Extreme: 30 and above           Subjective: Possibly a little better today, still with significant and mostly uncontrolled pain   Objective: Vitals:   01/20/23 0011 01/20/23 0740  BP: 114/77 (!) 141/86  Pulse: (!) 104 (!) 110  Resp: 20 18  Temp: 99 F (37.2 C) 99.9 F (37.7 C)  SpO2: 92% 96%    Intake/Output Summary (Last 24 hours) at 01/20/2023 1425 Last data filed at 01/20/2023 0827 Gross per 24 hour  Intake 360 ml  Output 1700 ml  Net -1340 ml   Filed Weights   01/17/23 0912 01/18/23 0947  Weight: 81.2 kg 81.2 kg    Exam:  General:  Appears calm and more comfortable than on prior visits Eyes:  EOMI, normal lids, iris ENT:  grossly normal hearing, lips & tongue, mmm Neck:  no LAD, masses or thyromegaly Cardiovascular:  RRR, no m/r/g. No LE edema.  Respiratory:   CTA bilaterally with no wheezes/rales/rhonchi.  Normal respiratory effort. Abdomen:  soft, NT, ND Skin:  no rash or induration seen on limited exam Musculoskeletal:  grossly normal tone BUE/BLE, no bony abnormality Psychiatric:  blunted mood and affect, speech fluent and appropriate, AOx3 Neurologic:  CN 2-12 grossly intact, moves all extremities in coordinated fashion  Data Reviewed: I have reviewed the patient's lab results since  admission.  Pertinent labs for today include:   Unremarkable BMP WBC 18 12/4 blood cultures MSSA 12/6 blood cultures NTD    Family Communication: None present today  Disposition: Status is: Inpatient Remains inpatient appropriate because: ongoing evaluation and treatment Anticipated dc possibly Tuesday, 12/11, following TEE.  Likely to either need placement of home health services, depending on weekend's progress.     Time spent: 50 minutes  Unresulted Labs (From admission, onward)     Start     Ordered   01/19/23 0500  QuantiFERON-TB Gold Plus  Tomorrow morning,   R        01/18/23 1706   Unscheduled  CBC with Differential/Platelet  Tomorrow morning,   R        01/20/23 1425             Author: [name], MD 01/20/2023 2:25 PM  For on call review [email or web address].

## 2023-01-20 NOTE — TOC Initial Note (Addendum)
Transition of Care [facility]) - Initial/Assessment Note    Patient Details  Name: Joseph [name] MRN: [id number] Date of Birth: [birth year]  Transition of Care [facility]) CM/SW Contact:    Liliana Cline, LCSW Phone Number: 01/20/2023, 12:00 PM  Clinical Narrative:                 CSW spoke with patient at bedside. Patient is from home with his spouse who provides transportation. PCP is Debbra Riding. Pharmacy is [facility] on [address]. Patient has home o2 through [place] and a cane. Patient had HH in the past, stated it was years ago and is unsure of agency.  CSW explained therapy recs for home health, rolling walker, wheelchair, bedside commode, and hospital bed. Patient is agreeable to all of these other than a hospital bed, he would like to talk with his wife before deciding if he wants a hospital bed to be ordered. TOC handoff updated to order DME closer to DC. Patient denies [facility] agency preferences. Referral made to [facility] with [facility].   Expected Discharge Plan: Home w Home Health Services Barriers to Discharge: Continued Medical Work up   Patient Goals and CMS Choice Patient states their goals for this hospitalization and ongoing recovery are:: home with home health CMS Medicare.gov Compare Post Acute Care list provided to:: Patient Choice offered to / list presented to : Patient      Expected Discharge Plan and Services       Living arrangements for the past 2 months: Single Family Home                 DME Arranged: Walker rolling, Wheelchair manual, Bedside commode   Date DME Agency Contacted: 01/20/23     HH Arranged: PT, OT, RN HH Agency: [facility] Date HH Agency Contacted: 01/20/23   Representative spoke with at [facility] Agency: Elnita Maxwell  Prior Living Arrangements/Services Living arrangements for the past 2 months: Single Family Home Lives with:: Spouse Patient language and need for interpreter reviewed:: Yes Do you feel safe going back to the  place where you live?: Yes      Need for Family Participation in Patient Care: Yes (Comment) Care giver support system in place?: Yes (comment) Current home services: DME Criminal Activity/Legal Involvement Pertinent to Current Situation/Hospitalization: No - Comment as needed  Activities of Daily Living   ADL Screening (condition at time of admission) Independently performs ADLs?: Yes (appropriate for developmental age) Is the patient deaf or have difficulty hearing?: No Does the patient have difficulty seeing, even when wearing glasses/contacts?: No Does the patient have difficulty concentrating, remembering, or making decisions?: No  Permission Sought/Granted Permission sought to share information with : Magazine features editor, Family Supports Permission granted to share information with : Yes, Verbal Permission Granted     Permission granted to share info w AGENCY: HH, DME agencies  Permission granted to share info w Relationship: spouse     Emotional Assessment       Orientation: : Oriented to Self, Oriented to Place, Oriented to  Time, Oriented to Situation Alcohol / Substance Use: Not Applicable Psych Involvement: No (comment)  Admission diagnosis:  SIRS (systemic inflammatory response syndrome) (HCC) [R65.10] Osteomyelitis of lumbar spine (HCC) [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.26] Acute midline low back pain without sciatica [M54.50] Patient Active Problem List   Diagnosis Date Noted   MSSA bacteremia 01/19/2023   Acute deep vein thrombosis (DVT) of iliac vein of left lower extremity (HCC) 01/17/2023   Acute midline low  back pain without sciatica 01/17/2023   SIRS (systemic inflammatory response syndrome) (HCC) 01/17/2023   Degeneration of intervertebral disc of lumbar region with discogenic back pain 01/17/2023   Cellulitis of right upper extremity 03/15/2022   Septic bursitis of elbow, right 03/15/2022   Leukocytosis 08/29/2021   Lymphedema of both lower extremities 08/29/2021    OSA (obstructive sleep apnea) 08/23/2021   COPD with acute exacerbation (HCC) 08/21/2021   COPD exacerbation (HCC) 08/07/2021   Acute asthma exacerbation 12/10/2020   COPD with acute bronchitis (HCC) 12/10/2020   Chronic respiratory failure (HCC) 12/10/2020   Essential hypertension 12/10/2020   GERD (gastroesophageal reflux disease) 12/10/2020   Sepsis (HCC) 12/05/2017   Prediabetes 09/24/2014   Tobacco use 08/20/2013   Severe persistent asthma 12/04/2012   PCP:  Wilford Corner, [name] Pharmacy:   [id number] - [place], [place] - [address] [facility] [address] [place] [address] Phone: [phone] Fax: [phone]     Social Determinants of Health (SDOH) Social History: SDOH Screenings   Food Insecurity: No Food Insecurity (01/18/2023)  Housing: Low Risk  (01/18/2023)  Transportation Needs: No Transportation Needs (01/18/2023)  Utilities: Not At Risk (01/18/2023)  Financial Resource Strain: Medium Risk (09/22/2022)   Received from [facility]  Tobacco Use: High Risk (12/28/2022)   Received from [facility]   SDOH Interventions:     Readmission Risk Interventions    01/20/2023   11:21 AM 09/02/2021   12:30 PM 08/23/2021   11:05 AM  Readmission Risk Prevention Plan  Post Dischage Appt Complete  Complete  Medication Screening Complete  Complete  Transportation Screening Complete Complete Complete  PCP or Specialist Appt within 5-7 Days  Complete   Home Care Screening  Complete   Medication Review (RN CM)  Complete

## 2023-01-21 ENCOUNTER — Other Ambulatory Visit: Payer: Self-pay

## 2023-01-21 DIAGNOSIS — R7881 Bacteremia: Secondary | ICD-10-CM | POA: Diagnosis not present

## 2023-01-21 DIAGNOSIS — B9561 Methicillin susceptible Staphylococcus aureus infection as the cause of diseases classified elsewhere: Secondary | ICD-10-CM | POA: Diagnosis not present

## 2023-01-21 LAB — CBC WITH DIFFERENTIAL/PLATELET
Abs Immature Granulocytes: 0.46 10*3/uL — ABNORMAL HIGH (ref 0.00–0.07)
Basophils Absolute: 0.1 10*3/uL (ref 0.0–0.1)
Basophils Relative: 0 %
Eosinophils Absolute: 0.1 10*3/uL (ref 0.0–0.5)
Eosinophils Relative: 1 %
HCT: 38.1 % — ABNORMAL LOW (ref 39.0–52.0)
Hemoglobin: 12 g/dL — ABNORMAL LOW (ref 13.0–17.0)
Immature Granulocytes: 2 %
Lymphocytes Relative: 8 %
Lymphs Abs: 1.8 10*3/uL (ref 0.7–4.0)
MCH: 26.3 pg (ref 26.0–34.0)
MCHC: 31.5 g/dL (ref 30.0–36.0)
MCV: 83.4 fL (ref 80.0–100.0)
Monocytes Absolute: 3.1 10*3/uL — ABNORMAL HIGH (ref 0.1–1.0)
Monocytes Relative: 14 %
Neutro Abs: 16.3 10*3/uL — ABNORMAL HIGH (ref 1.7–7.7)
Neutrophils Relative %: 75 %
Platelets: 346 10*3/uL (ref 150–400)
RBC: 4.57 MIL/uL (ref 4.22–5.81)
RDW: 17.7 % — ABNORMAL HIGH (ref 11.5–15.5)
Smear Review: NORMAL
WBC: 21.9 10*3/uL — ABNORMAL HIGH (ref 4.0–10.5)
nRBC: 0 % (ref 0.0–0.2)

## 2023-01-21 LAB — GLUCOSE, CAPILLARY
Glucose-Capillary: 100 mg/dL — ABNORMAL HIGH (ref 70–99)
Glucose-Capillary: 101 mg/dL — ABNORMAL HIGH (ref 70–99)
Glucose-Capillary: 128 mg/dL — ABNORMAL HIGH (ref 70–99)
Glucose-Capillary: 136 mg/dL — ABNORMAL HIGH (ref 70–99)
Glucose-Capillary: 154 mg/dL — ABNORMAL HIGH (ref 70–99)

## 2023-01-21 MED ORDER — SODIUM CHLORIDE 0.9 % IV SOLN: INTRAVENOUS | Status: AC

## 2023-01-21 MED ORDER — SODIUM CHLORIDE 0.9% FLUSH
10.0000 mL | Freq: Two times a day (BID) | INTRAVENOUS | Status: DC
  Administered 2023-01-21 – 2023-01-25 (×7): 10 mL

## 2023-01-21 MED ORDER — ALUM & MAG HYDROXIDE-SIMETH 200-200-20 MG/5ML PO SUSP
30.0000 mL | ORAL | Status: DC | PRN
  Administered 2023-01-21: 30 mL via ORAL
  Filled 2023-01-21: qty 30

## 2023-01-21 MED ORDER — SODIUM CHLORIDE 0.9% FLUSH: 10.0000 mL | INTRAVENOUS | Status: DC | PRN

## 2023-01-21 MED ORDER — CHLORHEXIDINE GLUCONATE CLOTH 2 % EX PADS
6.0000 | MEDICATED_PAD | Freq: Every day | CUTANEOUS | Status: DC
Start: 2023-01-21 — End: 2023-01-25
  Administered 2023-01-21 – 2023-01-25 (×5): 6 via TOPICAL

## 2023-01-21 NOTE — Plan of Care (Signed)

## 2023-01-21 NOTE — TOC Progression Note (Addendum)
Transition of Care [facility]) - Progression Note    Patient Details  Name: Joseph [name] MRN: [id number] Date of Birth: [birth year]  Transition [facility] Care [facility]) CM/SW Contact  Liliana Cline, LCSW Phone Number: 01/21/2023, 10:35 AM  Clinical Narrative:    PT rec changed to CIR. TOC will follow for CIR eval. Updated Cheryl with [facility], she states they can follow patient after he DC from CIR. If patient does go home as originally planned, TOC will need to order DME and notify [facility] - updated TOC handoff.   Expected Discharge Plan: Home w Home Health Services Barriers to Discharge: Continued Medical Work up  Expected Discharge Plan and Services       Living arrangements for the past 2 months: Single Family Home                 DME Arranged: Walker rolling, Wheelchair manual, Bedside commode   Date DME Agency Contacted: 01/20/23     HH Arranged: PT, OT, RN HH Agency: [facility] Date HH Agency Contacted: 01/20/23   Representative spoke with at [facility] Agency: Elnita Maxwell   Social Determinants of Health (SDOH) Interventions SDOH Screenings   Food Insecurity: No Food Insecurity (01/18/2023)  Housing: Low Risk  (01/18/2023)  Transportation Needs: No Transportation Needs (01/18/2023)  Utilities: Not At Risk (01/18/2023)  Financial Resource Strain: Medium Risk (09/22/2022)   Received from [facility]  Tobacco Use: High Risk (12/28/2022)   [facility] from [facility]    Readmission Risk Interventions    01/20/2023   11:21 AM 09/02/2021   12:30 PM 08/23/2021   11:05 AM  Readmission Risk Prevention Plan  Post Dischage Appt Complete  Complete  Medication Screening Complete  Complete  Transportation Screening Complete Complete Complete  PCP or Specialist Appt within 5-7 Days  Complete   Home Care Screening  Complete   Medication Review (RN CM)  Complete

## 2023-01-21 NOTE — Progress Notes (Signed)
Progress Note   [name] FAO:[id number] DOB: [birth year] DOA: 01/17/2023     4 DOS: the patient was seen and examined on 01/21/2023   Brief hospital course: 42yo with h/o asthma/COPD on chronic steroids and 2L home O2, DM, and HTN who presented on 12/4 with worsening LBP and SOB.  Imaging concerning for L4-S1 osteomyelitis (CT but not MRI) as well as a left iliac DVT.  Started on Heparin.  Blood cultures growing MSSA, started on Cefazolin. Underwent venogram on 12/5, no evidence of thrombus, vascular surgery signed off, Heparin discontinued.  ID is consulting, agreed with concern for discitis as the source but MRI is negative for discitis or epidural abscess so source is uncertain, will need hip MRI.  Neurosurgery consulted, nonoperative management, pain control (will ask palliative care to help).  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 Negative scrotal US.  Assessment and Plan:  MSSA Bacteremia Patient with h/o MRSA septic bursitis presenting with back pain. CT showed L4-S1 osteo/discitis but MRI (without contrast) did not show this definitively and MRI with contrast ruled this out as source There was concern for L iliac DVT, although venogram was negative so heparin stopped and vascular signed off Neurosurgery consulted and thinks intervention would not likely be needed Will need antibiotics for 4-6 weeks - currently on Cefazolin With vertebral osteo, needs testing for TB; ordered quantiferon gold and HIV is negative With bacteremia, ID consulted  Echo unremarkable, plan for TEE with [facility] Monday, 12/9 Likely to need PICC line placed Monday Possible dc to CIR on 12/10   Left AS labral tear/Spinal stenosis Patient with a history of chronic low back pain followed by [facility]opedic surgery now presenting with worsening low back pain and L hip pain  X-ray of the low back shows multilevel degenerative changes of the L-spine,  worse at L3-4.  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 Meredeth Ide in 09/2022 Lung auscultation reveals diffuse expiratory wheezes Status post IV Solu-Medrol and DuoNeb x 1 in the ED Continue supplemental oxygen Prednisone 40 mg daily x 2 days and then resume home dose of 7.5 mg daily Resume home Incruse Ellipta and Singulair Standing Duonebs and as needed prednisone for SOB and wheezing Continue Dupixent every 2 weeks Added IS and flutter valve   DM New onset, A1c 6.8 May be related to steroid-induced hyperglycemia Will add moderate-scale SSI   HTN Resume home HCTZ and losartan   GERD Protonix 40 mg daily (in lieu of home omeprazole)   Depression Patient and wife report long-standing depression He has rage issues and is reluctant to try SSRI Given his chronic pain, he is willing to trial Duloxetine   Class 1 Obesity Body mass index is 34.96 kg/m.Marland Kitchen  Weight loss should be encouraged Outpatient PCP/bariatric medicine f/u encouraged  Nutrition Problem: Inadequate oral intake Etiology:  poor appetite Signs/Symptoms: per patient/family report Interventions: Ensure Enlive (each supplement provides 350kcal and 20 grams of protein), MVI, Liberalize Diet           Consultants: Vascular  surgery ID Neurosurgery  Palliative care OT PT TOC team   Procedures: LLE venogram 12/5   Antibiotics: Ceftriaxone x 1 Vancomycin x 1 Cefazolin 12/5-  30 Day Unplanned Readmission Risk Score    Flowsheet Row ED to Hosp-Admission (Current) from 01/17/2023 in [facility] (1A)  30 Day Unplanned Readmission Risk Score (%) 11.04 Filed at 01/21/2023 0401       This score is the patient's risk of an unplanned readmission within 30 days of being discharged (0 -100%). The score is based on dignosis, age, lab data, medications, orders, and past utilization.   Low:  0-14.9   Medium: 15-21.9   High: 22-29.9   Extreme: 30 and above           Subjective: Feeling some better today.  Was able to get OOB with therapy.  Misunderstood about CIR and thought this would require driving to [place]; he is now willing to discuss with his wife and consider this.   Objective: Vitals:   01/20/23 2328 01/21/23 0816  BP: 98/78 (!) 143/80  Pulse: 98 (!) 106  Resp: 20 16  Temp: 98 F (36.7 C) 98.2 F (36.8 C)  SpO2: 97% 95%    Intake/Output Summary (Last 24 hours) at 01/21/2023 1231 Last data filed at 01/21/2023 1129 Gross per 24 hour  Intake --  Output 400 ml  Net -400 ml   Filed Weights   01/17/23 0912 01/18/23 0947  Weight: 81.2 kg 81.2 kg    Exam:  General:  Appears calm and more comfortable than on prior visits Eyes:  EOMI, normal lids, iris ENT:  grossly normal hearing, lips & tongue, mmm Neck:  no LAD, masses or thyromegaly Cardiovascular:  RRR, no m/r/g. No LE edema.  Respiratory:   CTA bilaterally with no wheezes/rales/rhonchi.  Normal respiratory effort. Abdomen:  soft, NT, ND Skin:  no rash or induration seen on limited exam Musculoskeletal:  grossly normal tone BUE/BLE, no bony abnormality Psychiatric:  blunted mood and affect, speech fluent and appropriate, AOx3 Neurologic:  CN 2-12 grossly intact, moves all extremities in coordinated  fashion  Data Reviewed: I have reviewed the patient's lab results since admission.  Pertinent labs for today include:  WBC 21.9 Hgb 12     Family Communication: None present  Disposition: Status is: Inpatient Remains inpatient appropriate because: ongoing evaluation and management     Time spent: 50 minutes  Unresulted Labs (From admission, onward)     Start     Ordered   01/22/23 0500  CBC with Differential/Platelet  Tomorrow morning,   R        01/21/23 1231   01/22/23 0500  Basic metabolic panel  Tomorrow morning,   R        01/21/23 1231   01/19/23 0500  QuantiFERON-TB Gold Plus  Tomorrow morning,   R        01/18/23 1706             Author: [name], MD 01/21/2023 12:31 PM  For on call review [email or web address].

## 2023-01-21 NOTE — Progress Notes (Signed)
Peripherally Inserted Central Catheter Placement  The IV Nurse has discussed with the patient and/or persons authorized to consent for the patient, the purpose of this procedure and the potential benefits and risks involved with this procedure.  The benefits include less needle sticks, lab draws from the catheter, and the patient may be discharged home with the catheter. Risks include, but not limited to, infection, bleeding, blood clot (thrombus formation), and puncture of an artery; nerve damage and irregular heartbeat and possibility to perform a PICC exchange if needed/ordered by physician.  Alternatives to this procedure were also discussed.  Bard Power PICC patient education guide, fact sheet on infection prevention and patient information card has been provided to patient /or left at bedside.    PICC Placement Documentation  PICC Single Lumen 01/21/23 Right Brachial 37 cm 0 cm (Active)  Indication for Insertion or Continuance of Line Home intravenous therapies (PICC only) 01/21/23 1702  Exposed Catheter (cm) 0 cm 01/21/23 1702  Site Assessment Clean, Dry, Intact 01/21/23 1702  Line Status Flushed;Saline locked;Blood return noted 01/21/23 1702  Dressing Type Transparent;Securing device 01/21/23 1702  Dressing Status Antimicrobial disc in place;Clean, Dry, Intact 01/21/23 1702  Line Care Connections checked and tightened 01/21/23 1702  Line Adjustment (NICU/IV Team Only) No 01/21/23 1702  Dressing Intervention New dressing;Adhesive placed at insertion site (IV team only);Adhesive placed around edges of dressing (IV team/ICU RN only) 01/21/23 1702  Dressing Change Due 01/28/23 01/21/23 1702       Joseph [name] 01/21/2023, 5:03 PM

## 2023-01-21 NOTE — Progress Notes (Addendum)
Physical Therapy Treatment Patient Details Name: Joseph [name] MRN: [id number] DOB: [birth year] Today's Date: 01/21/2023   History of Present Illness Pt is a 42 y.o. male presenting to hospital 01/17/23 with c/o SOB and worsening LBP.  Imaging initially concerning for deep venous thrombosis of the left iliac vein.  Pt admitted with L4-S1 discitis with MSSA bacteremia, LBP, L AS labral tear, spinal stenosis, COPD/asthma exacerbation, and leukocytosis.  PMH includes h/o chronic hypoxia requiring supplemental O2 at home, COPD, chronic L hip pain secondary to labral tear, R olecranon bursectomy.    PT Comments  Ready for session.  RN in to give IV pain meds at start of session.  He is able to transition to/from supine with rails and supervision.  Steady in sitting but does use BUE's to off-load pain.  Stands to RW with cga x 1 and is able to progress gait 20' with RW and very heavy dependence on RW for support.  Gait is slow and cautious with decreased WB through LLE.  While he is able to pull up his pants in standing with 1 UE support, most functional tasks while standing would be challenging at best.   While he has no LOB or buckling he is at increased risk for falls.  Strong upper body does allow for gait despite back pain.    Discussed at length CIR vs HHPT.  Pt stated he does not want to go to CIR mostly because it is in [place] but does voice understanding of concerns about being home alone during the day when his wife is at work.  CIR would be a good option for pt but he does not want to look into it at this time so consult is deferred.  If he changes his mind he would be an excellent candidate.    Addendum:  after pt discussed with MD he is now open to CIR.  Would like to look into options so consult will be placed.  Chart updated to reflect.     If plan is discharge home, recommend the following: A little help with walking and/or transfers;A little help with bathing/dressing/bathroom;Assistance  with cooking/housework;Assist for transportation;Help with stairs or ramp for entrance   Can travel by private vehicle        Equipment Recommendations  Rolling walker (2 wheels);Wheelchair (measurements PT);Wheelchair cushion (measurements PT);BSC/3in1    Recommendations for Other Services       Precautions / Restrictions Precautions Precautions: Fall Restrictions Weight Bearing Restrictions: No     Mobility  Bed Mobility Overal bed mobility: Needs Assistance Bed Mobility: Rolling, Sidelying to Sit, Sit to Sidelying Rolling: Modified independent (Device/Increase time) Sidelying to sit: Supervision     Sit to sidelying: Supervision General bed mobility comments: uses rails but no assist. Patient Response: Cooperative  Transfers Overall transfer level: Needs assistance Equipment used: Rolling walker (2 wheels) Transfers: Sit to/from Stand Sit to Stand: Contact guard assist                Ambulation/Gait Ambulation/Gait assistance: Contact guard assist, Min assist Gait Distance (Feet): 20 Feet Assistive device: Rolling walker (2 wheels) Gait Pattern/deviations: Step-through pattern, Decreased step length - right, Decreased step length - left Gait velocity: decreased     General Gait Details: heavy use of BUE on walker for support   Stairs             Wheelchair Mobility     Tilt Bed Tilt Bed Patient Response: Cooperative  Modified Rankin (Stroke Patients Only)  Balance Overall balance assessment: Needs assistance Sitting-balance support: Bilateral upper extremity supported, Feet supported Sitting balance-Leahy Scale: Fair       Standing balance-Leahy Scale: Fair Standing balance comment: heavy use of RW for support                            Cognition Arousal: Alert Behavior During Therapy: WFL for tasks assessed/performed Overall Cognitive Status: Within Functional Limits for tasks assessed                                           Exercises      General Comments        Pertinent Vitals/Pain Pain Assessment Pain Assessment: 0-10 Pain Score: 8  Pain Location: LBP Pain Descriptors / Indicators: Aching, Constant, Discomfort, Grimacing, Guarding, Sharp, Shooting Pain Intervention(s): Limited activity within patient's tolerance, Monitored during session, Premedicated before session, Repositioned, RN gave pain meds during session    Home Living                          Prior Function            PT Goals (current goals can now be found in the care plan section) Progress towards PT goals: Progressing toward goals    Frequency    Min 1X/week      PT Plan      Co-evaluation              AM-PAC PT "6 Clicks" Mobility   Outcome Measure  Help needed turning from your back to your side while in a flat bed without using bedrails?: None Help needed moving from lying on your back to sitting on the side of a flat bed without using bedrails?: A Little Help needed moving to and from a bed to a chair (including a wheelchair)?: A Little Help needed standing up from a chair using your arms (e.g., wheelchair or bedside chair)?: A Little Help needed to walk in hospital room?: A Little Help needed climbing 3-5 steps with a railing? : Total 6 Click Score: 17    End of Session Equipment Utilized During Treatment: Gait belt Activity Tolerance: Patient limited by pain Patient left: in bed;with call bell/phone within reach;with bed alarm set Nurse Communication: Mobility status;Precautions;Other (comment) PT Visit Diagnosis: Other abnormalities of gait and mobility (R26.89);Muscle weakness (generalized) (M62.81);Pain     Time: 4401-0272 PT Time Calculation (min) (ACUTE ONLY): 17 min  Charges:    $Gait Training: 8-22 mins PT General Charges $$ ACUTE PT VISIT: 1 Visit                  Danielle Dess, PTA 01/21/23, 9:05 AM

## 2023-01-21 NOTE — Plan of Care (Signed)
  Problem: Education: Goal: Knowledge of General Education information will improve Description: Including pain rating scale, medication(s)/side effects and non-pharmacologic comfort measures Outcome: Progressing   Problem: Clinical Measurements: Goal: Respiratory complications will improve Outcome: Progressing   Problem: Activity: Goal: Risk for activity intolerance will decrease Outcome: Not Progressing   Problem: Pain Management: Goal: General experience of comfort will improve Outcome: Not Progressing

## 2023-01-22 ENCOUNTER — Encounter: Payer: Self-pay | Admitting: Anesthesiology

## 2023-01-22 DIAGNOSIS — R7881 Bacteremia: Secondary | ICD-10-CM | POA: Diagnosis not present

## 2023-01-22 DIAGNOSIS — B9561 Methicillin susceptible Staphylococcus aureus infection as the cause of diseases classified elsewhere: Secondary | ICD-10-CM | POA: Diagnosis not present

## 2023-01-22 LAB — BASIC METABOLIC PANEL
Anion gap: 10 (ref 5–15)
BUN: 31 mg/dL — ABNORMAL HIGH (ref 6–20)
CO2: 31 mmol/L (ref 22–32)
Calcium: 8.8 mg/dL — ABNORMAL LOW (ref 8.9–10.3)
Chloride: 96 mmol/L — ABNORMAL LOW (ref 98–111)
Creatinine, Ser: 0.77 mg/dL (ref 0.61–1.24)
GFR, Estimated: >60 mL/min (ref >60)
Glucose, Bld: 104 mg/dL — ABNORMAL HIGH (ref 70–99)
Potassium: 3.6 mmol/L (ref 3.5–5.1)
Sodium: 137 mmol/L (ref 135–145)

## 2023-01-22 LAB — CBC WITH DIFFERENTIAL/PLATELET
Abs Immature Granulocytes: 0.92 10*3/uL — ABNORMAL HIGH (ref 0.00–0.07)
Basophils Absolute: 0.1 10*3/uL (ref 0.0–0.1)
Basophils Relative: 1 %
Eosinophils Absolute: 0.3 10*3/uL (ref 0.0–0.5)
Eosinophils Relative: 1 %
HCT: 36.7 % — ABNORMAL LOW (ref 39.0–52.0)
Hemoglobin: 11.8 g/dL — ABNORMAL LOW (ref 13.0–17.0)
Immature Granulocytes: 4 %
Lymphocytes Relative: 9 %
Lymphs Abs: 2.1 10*3/uL (ref 0.7–4.0)
MCH: 26.6 pg (ref 26.0–34.0)
MCHC: 32.2 g/dL (ref 30.0–36.0)
MCV: 82.8 fL (ref 80.0–100.0)
Monocytes Absolute: 3.4 10*3/uL — ABNORMAL HIGH (ref 0.1–1.0)
Monocytes Relative: 15 %
Neutro Abs: 15.3 10*3/uL — ABNORMAL HIGH (ref 1.7–7.7)
Neutrophils Relative %: 70 %
Platelets: 397 10*3/uL (ref 150–400)
RBC: 4.43 MIL/uL (ref 4.22–5.81)
RDW: 17.4 % — ABNORMAL HIGH (ref 11.5–15.5)
WBC: 22 10*3/uL — ABNORMAL HIGH (ref 4.0–10.5)
nRBC: 0 % (ref 0.0–0.2)

## 2023-01-22 LAB — GLUCOSE, CAPILLARY
Glucose-Capillary: 103 mg/dL — ABNORMAL HIGH (ref 70–99)
Glucose-Capillary: 112 mg/dL — ABNORMAL HIGH (ref 70–99)
Glucose-Capillary: 108 mg/dL — ABNORMAL HIGH (ref 70–99)
Glucose-Capillary: 124 mg/dL — ABNORMAL HIGH (ref 70–99)
Glucose-Capillary: 112 mg/dL — ABNORMAL HIGH (ref 70–99)

## 2023-01-22 MED ORDER — OXYCODONE HCL 5 MG PO TABS
5.0000 mg | ORAL_TABLET | ORAL | Status: DC | PRN
  Administered 2023-01-22: 5 mg via ORAL
  Administered 2023-01-23 – 2023-01-25 (×3): 10 mg via ORAL
  Filled 2023-01-22 (×2): qty 2
  Filled 2023-01-22: qty 1
  Filled 2023-01-22: qty 2

## 2023-01-22 NOTE — Progress Notes (Signed)
Inpatient Rehab Coordinator Note:  I spoke with patient and his spouse, Joseph [name], over the phone to discuss CIR recommendations and goals/expectations of CIR stay.  We reviewed 3 hrs/day of therapy, physician follow up, and average length of stay 2 weeks (dependent upon progress) with goals of modified independence.  Both are in agreement to pursue CIR when pain controlled on PO medication (Dr. Ophelia Charter is working on this already).  I reviewed Medicare benefits and I will follow for potential admit in the next few days.   Estill Dooms, PT, DPT Admissions Coordinator [phone] 01/22/23  12:36 PM

## 2023-01-22 NOTE — PMR Pre-admission (Shared)
PMR Admission Coordinator Pre-Admission Assessment  [name] is an 42 y.o., male MRN: [id number] DOB: Jun 27, 1980 Height: 5' (152.4 cm) Weight: 81.2 kg  Insurance Information HMO:     PPO:      PCP:      IPA:      80/20:      OTHER:  PRIMARY: Medicare A/B      Policy#: [id number]      Subscriber:  CM Name:      Phone#:      Fax#:  Pre-Cert#: verified Health and safety inspector:  Benefits:  Phone #:      Name:  Eff. Date: 07/15/02 A/B     Deduct: $1632      Out of Pocket Max: n/a      Life Max: n/a CIR: 100%      SNF: 20 full days Outpatient: 80%     Co-Pay: 20% Home Health: 100%      Co-Pay:  DME: 80%     Co-Pay: 20% Providers:  SECONDARY:       Policy#:      Phone#:   Artist:       Phone#:   The Engineer, materials Information Summary" for patients in Inpatient Rehabilitation Facilities with attached "Privacy Act Statement-Health Care Records" was provided and verbally reviewed with: Patient and Family  Emergency Contact Information Contact Information     Name Relation Home Work Mobile   [name],[name] Spouse   [phone]      Other Contacts   None on File     Current Medical History  Patient Admitting Diagnosis: discitis, bacteremia  History of Present Illness: Pt is a 42 y/o male with PMH of chronic hypoxia/COPD (on 2L home O2), chronic L hip pain 2/2 labral tear, admitted to [facility] on 01/17/23 with c/o worsening LBP and SOB.  Imaging concerning for L4-S1 discitis as well as L iliac DVT. MRI lumbar spine negative.  MRI L hip showed moderate left femoral head osteonecrosis compared with MRI from 5 weeks prior without subchondral collapse or secondary degenerative change, with new avulsion of L adductor longus tendon from pubic symphysis. Cultures showed MSSA bacteremia and pt on IV cefazolin x4-6 weeks.  Echo unremarkable.  TEE planned ***.   Patient's medical record from [facility] has been reviewed by the rehabilitation admission coordinator and  physician.  Past Medical History  Past Medical History:  Diagnosis Date   Asthma    COPD (chronic obstructive pulmonary disease) (HCC)    Eczema    Hypertension     Has the patient had major surgery during 100 days prior to admission? No  Family History   family history includes Hypertension in his mother.  Current Medications  Current Facility-Administered Medications:    0.9 %  sodium chloride infusion, , Intravenous, Continuous, Hudson, Caralyn, [name], Last Rate: 20 mL/hr at 01/22/23 0532, Infusion Verify at 01/22/23 0532   acetaminophen (TYLENOL) tablet 650 mg, 650 mg, Oral, Q6H PRN, 650 mg at 01/18/23 1640 **OR** acetaminophen (TYLENOL) suppository 650 mg, 650 mg, Rectal, Q6H PRN, [name], MD   albuterol (PROVENTIL) (2.5 MG/3ML) 0.083% nebulizer solution 2.5 mg, 2.5 mg, Nebulization, Q2H PRN, [name], MD, 2.5 mg at 01/19/23 0935   alum & mag hydroxide-simeth (MAALOX/MYLANTA) 200-200-20 MG/5ML suspension 30 mL, 30 mL, Oral, Q4H PRN, [name], MD, 30 mL at 01/21/23 1409   ceFAZolin (ANCEF) IVPB 2g/100 mL premix, 2 g, Intravenous, Q8H, [name], MD, Last Rate: 200 mL/hr at  01/22/23 0821, 2 g at 01/22/23 8119   celecoxib (CELEBREX) capsule 200 mg, 200 mg, Oral, BID, [name], MD, 200 mg at 01/21/23 2006   Chlorhexidine Gluconate Cloth 2 % PADS 6 each, 6 each, Topical, Daily, [name], MD, 6 each at 01/21/23 1736   DULoxetine (CYMBALTA) DR capsule 20 mg, 20 mg, Oral, Daily, [name], MD, 20 mg at 01/21/23 1013   enoxaparin (LOVENOX) injection 40 mg, 40 mg, Subcutaneous, QHS, [name], MD, 40 mg at 01/21/23 2007   feeding supplement (ENSURE ENLIVE / ENSURE PLUS) liquid 237 mL, 237 mL, Oral, TID BM, [name], MD, 237 mL at 01/21/23 2007   gabapentin (NEURONTIN) capsule 300 mg, 300 mg, Oral, TID, Georgiann Cocker, FNP, 300 mg at 01/21/23 2007   hydrochlorothiazide (HYDRODIURIL) tablet 25 mg, 25 mg, Oral, Daily, [name], MD, 25 mg at 01/21/23 1013   HYDROmorphone (DILAUDID) injection 1 mg, 1 mg, Intravenous, Q2H PRN, [name], MD, 1 mg at 01/22/23 1110   HYDROmorphone (DILAUDID) tablet 2 mg, 2 mg, Oral, TID, Georgiann Cocker, FNP, 2 mg at 01/21/23 2006   insulin aspart (novoLOG) injection 0-15 Units, 0-15 Units, Subcutaneous, TID WC, [name], MD, 3 Units at 01/19/23 1622   lidocaine (LIDODERM) 5 % 1 patch, 1 patch, Transdermal, Q24H, Georgiann Cocker, FNP, 1 patch at 01/21/23 1409   losartan (COZAAR) tablet 100 mg, 100 mg, Oral, Daily, [name], MD, 100 mg at 01/21/23 1013   montelukast (SINGULAIR) tablet 10 mg, 10 mg, Oral, QHS, [name], MD, 10 mg at 01/21/23 2006   multivitamin with minerals tablet 1 tablet, 1 tablet, Oral, Daily, [name], MD, 1 tablet at 01/21/23 1013   ondansetron (ZOFRAN) tablet 4 mg, 4 mg, Oral, Q6H PRN **OR** ondansetron (ZOFRAN) injection 4 mg, 4 mg, Intravenous, Q6H PRN, [name], MD   oxyCODONE (Oxy IR/ROXICODONE) immediate release tablet 5-10 mg, 5-10 mg, Oral, Q4H PRN, [name], MD   pantoprazole (PROTONIX) EC tablet 40 mg, 40 mg, Oral, Daily, [name], MD, 40 mg at 01/21/23 1013   predniSONE (DELTASONE) tablet 7.5 mg, 7.5 mg, Oral, Q breakfast, [name], MD, 7.5 mg at 01/21/23 1478   QUEtiapine (SEROQUEL) tablet 25 mg, 25 mg, Oral, QHS, Georgiann Cocker, FNP, 25 mg at 01/21/23 2006   senna-docusate (Senokot-S) tablet 1 tablet, 1 tablet, Oral, QHS PRN, [name], MD   sodium chloride flush (NS) 0.9 % injection 10-40 mL, 10-40 mL, Intracatheter, Q12H, [name], MD, 10 mL at 01/21/23 2008   sodium chloride flush (NS) 0.9 % injection 10-40 mL, 10-40 mL, Intracatheter, PRN, [name], MD   tiZANidine (ZANAFLEX) tablet 2 mg, 2 mg, Oral, TID, [name], MD, 2 mg at 01/21/23 2006   umeclidinium bromide (INCRUSE ELLIPTA) 62.5 MCG/ACT 1 puff, 1 puff, Inhalation, Daily, [name], MD, 1 puff at 01/22/23 2956  Patients Current Diet:  Diet Order             Diet regular Room service appropriate? Yes; Fluid consistency: Thin  Diet effective now                   Precautions / Restrictions Precautions Precautions: Fall Restrictions Weight Bearing Restrictions: No   Has the patient had 2 or more falls or a fall with injury in the past year? Yes  Prior Activity Level Limited Community (1-2x/wk): independent, occassional mod I with SPC, home O2 2L, driving prior to admit  Prior Functional Level Self Care: Did the patient need help bathing, dressing, using the toilet or eating? Independent  Indoor Mobility: Did the patient need assistance with walking from room to room (with or without device)? Independent  Stairs: Did the patient need assistance with internal or external stairs (with or without device)? Independent  Functional Cognition: Did the patient need help planning regular tasks such as shopping or remembering to take medications? Independent  Patient Information Are you of Hispanic, Latino/a,or Spanish origin?: C. Yes, [place] What is your race?: A. White Do you need or want an interpreter to communicate with a doctor or health care staff?: 0. No  Patient's Response To:  Health Literacy and Transportation Is the patient able to respond to health literacy and transportation needs?: Yes Health Literacy - How often do you need to have someone help you when you read instructions, pamphlets, or other written material from your doctor or pharmacy?: Never In the past 12 months, has lack of transportation kept you from medical appointments or from getting medications?: No In the past 12 months, has lack of transportation kept you from meetings, work, or from getting things needed for daily living?: No  Home Assistive Devices / Equipment Home Equipment: Gilmer Mor - single point  Prior Device Use: Indicate devices/aids used by the patient  prior to current illness, exacerbation or injury?  Cane occasionally  Current Functional Level Cognition  Overall Cognitive Status: Within Functional Limits for tasks assessed Orientation Level: Oriented X4    Extremity Assessment (includes Sensation/Coordination)  Upper Extremity Assessment: Defer to OT evaluation  Lower Extremity Assessment: Generalized weakness    ADLs  Overall ADL's : Needs assistance/impaired Functional mobility during ADLs: Moderate assistance, Contact guard assist General ADL Comments: Pt reported being in lots of pain but willing to get in the recliner during session. Pt rolled in bed and sat up with mod A +use of rails. Pt attempted to sit up at the EOB x2; on first attempt pt quickly laid back down d/t pain. Pt requested scooting in recliner, pt completed scooting in chair with CGA-Min A and requested getting back in bed. Pt reported 10/10 pain in back area and requested pain meds, RN notified.    Mobility  Overal bed mobility: Needs Assistance Bed Mobility: Rolling, Sidelying to Sit, Sit to Sidelying Rolling: Modified independent (Device/Increase time) Sidelying to sit: Supervision Sit to sidelying: Supervision General bed mobility comments: uses rails but no assist.    Transfers  Overall transfer level: Needs assistance Equipment used: Rolling walker (2 wheels) Transfers: Sit to/from Stand Sit to Stand: Contact guard assist Bed to/from chair/wheelchair/BSC transfer type:: Lateral/scoot transfer  Lateral/Scoot Transfers: Contact guard assist, Min assist General transfer comment: increased effort and use of momentum to stand    Ambulation / Gait / Stairs / Psychologist, prison and probation services  Ambulation/Gait Ambulation/Gait assistance: Contact guard assist, Min assist Gait Distance (Feet): 20 Feet Assistive device: Rolling walker (2 wheels) Gait Pattern/deviations: Step-through pattern, Decreased step length - right, Decreased step length - left General Gait  Details: heavy use of BUE on walker for support Gait velocity: decreased    Posture / Balance Dynamic Sitting Balance Sitting balance - Comments: steady static sitting (heavy UE support through B UE's in sitting; pt initially arching his back when sitting up with his hands supporting him from behind but then walked his hands forward for more of a neutral upright posture--pt reports doing this because of the pain) Balance Overall balance assessment: Needs assistance Sitting-balance support: Bilateral  upper extremity supported, Feet supported Sitting balance-Leahy Scale: Fair Sitting balance - Comments: steady static sitting (heavy UE support through B UE's in sitting; pt initially arching his back when sitting up with his hands supporting him from behind but then walked his hands forward for more of a neutral upright posture--pt reports doing this because of the pain) Standing balance support: Bilateral upper extremity supported Standing balance-Leahy Scale: Fair Standing balance comment: heavy use of RW for support    Special needs/care consideration Continuous Drip IV  cefazolin, Oxygen 2L, and Diabetic management yes   Previous Home Environment (from acute therapy documentation) Living Arrangements: Spouse/significant other Available Help at Discharge: Family, Available 24 hours/day Type of Home: House Home Layout: One level Home Access: Stairs to enter Entrance Stairs-Rails: None Entrance Stairs-Number of Steps: 1 (side door) Home Care Services: No  Discharge Living Setting Plans for Discharge Living Setting: Patient's home, Lives with (comment) (spouse) Type of Home at Discharge: House Discharge Home Layout: One level Discharge Home Access: Level entry Entrance Stairs-Number of Steps: through side entry, no steps Discharge Bathroom Shower/Tub: Tub/shower unit Discharge Bathroom Toilet: Standard Discharge Bathroom Accessibility: Yes How Accessible: Accessible via walker Does  the patient have any problems obtaining your medications?: No  Social/Family/Support Systems Patient Roles: Spouse Anticipated Caregiver: spouse, Zared Camelo Anticipated Caregiver's Contact Information: [phone] Ability/Limitations of Caregiver: none stated Caregiver Availability: 24/7 Discharge Plan Discussed with Primary Caregiver: Yes Is Caregiver In Agreement with Plan?: Yes Does Caregiver/Family have Issues with Lodging/Transportation while Pt is in Rehab?: No  Goals Patient/Family Goal for Rehab: PT/OT mod I, SLP n/a Expected length of stay: 12-14 days Additional Information: Discharge plan: return to pt's home, mod I level.  Spouse able to provide support if needed Pt/Family Agrees to Admission and willing to participate: Yes Program Orientation Provided & Reviewed with Pt/Caregiver Including Roles  & Responsibilities: Yes  Decrease burden of Care through IP rehab admission: n/a  Possible need for SNF placement upon discharge: Not anticipated.  Plan for mod I goals at discharge, and has support from wife if needed.   Patient Condition: I have reviewed medical records from [facility], spoken with CM, and patient and spouse. I discussed via phone for inpatient rehabilitation assessment.  Patient will benefit from ongoing PT and OT, can actively participate in 3 hours of therapy a day 5 days of the week, and can make measurable gains during the admission.  Patient will also benefit from the coordinated team approach during an Inpatient Acute Rehabilitation admission.  The patient will receive intensive therapy as well as Rehabilitation physician, nursing, social worker, and care management interventions.  Due to safety, skin/wound care, disease management, medication administration, pain management, and patient education the patient requires 24 hour a day rehabilitation nursing.  The patient is currently min assist with mobility and basic ADLs.  Discharge setting and therapy post  discharge at home with home health is anticipated.  Patient has agreed to participate in the Acute Inpatient Rehabilitation Program and will admit {Time; today/tomorrow:10263}.  Preadmission Screen Completed By:  Stephania Fragmin, PT, DPT 01/22/2023 12:36 PM ______________________________________________________________________   Discussed status with Dr. Marland Kitchen on *** at *** and received approval for admission today.  Admission Coordinator:  Stephania Fragmin, PT, DPT time Marland KitchenDorna Bloom ***   Assessment/Plan: Diagnosis: Does the need for close, 24 hr/day Medical supervision in concert with the patient's rehab needs make it unreasonable for this patient to be served in a less intensive setting? {yes_no_potentially:3041433} Co-Morbidities requiring supervision/potential complications: ***  Due to {due NG:2952841}, does the patient require 24 hr/day rehab nursing? {yes_no_potentially:3041433} Does the patient require coordinated care of a physician, rehab nurse, PT, OT, and SLP to address physical and functional deficits in the context of the above medical diagnosis(es)? {yes_no_potentially:3041433} Addressing deficits in the following areas: {deficits:3041436} Can the patient actively participate in an intensive therapy program of at least 3 hrs of therapy 5 days a week? {yes_no_potentially:3041433} The potential for patient to make measurable gains while on inpatient rehab is {potential:3041437} Anticipated functional outcomes upon discharge from inpatient rehab: {functional outcomes:304600100} PT, {functional outcomes:304600100} OT, {functional outcomes:304600100} SLP Estimated rehab length of stay to reach the above functional goals is: *** Anticipated discharge destination: {anticipated dc setting:21604} 10. Overall Rehab/Functional Prognosis: {potential:3041437}   MD Signature: ***

## 2023-01-22 NOTE — Plan of Care (Signed)

## 2023-01-22 NOTE — TOC Progression Note (Signed)
Transition of Care [facility]) - Progression Note    Patient Details  Name: Joseph [name] MRN: [id number] Date of Birth: [birth year]  [facility]) CM/SW Contact  Garret Reddish, RN Phone Number: 01/22/2023, 12:38 PM  Clinical Narrative:    Chart reviewed.  Noted that PT recommended CIR.  CIR to evaluate patient.  Noted that patient will also require IV antibiotics to treat MSSA bacteremia, source of infection is unclear.    TOC will continue to follow progress of patient.     Expected Discharge Plan: Home w Home Health Services Barriers to Discharge: Continued Medical Work up  Expected Discharge Plan and Services       Living arrangements for the past 2 months: Single Family Home                 DME Arranged: Walker rolling, Wheelchair manual, Bedside commode   Date DME Agency Contacted: 01/20/23     HH Arranged: PT, OT, RN HH Agency: [facility] Date HH Agency Contacted: 01/20/23   Representative spoke with at [facility] Agency: Elnita Maxwell   Social Determinants of Health (SDOH) Interventions SDOH Screenings   Food Insecurity: No Food Insecurity (01/18/2023)  Housing: Low Risk  (01/18/2023)  Transportation Needs: No Transportation Needs (01/18/2023)  Utilities: Not At Risk (01/18/2023)  Financial Resource Strain: Medium Risk (09/22/2022)   Received from [facility]  Tobacco Use: High Risk (12/28/2022)   Received from [facility]    Readmission Risk Interventions    01/20/2023   11:21 AM 09/02/2021   12:30 PM 08/23/2021   11:05 AM  Readmission Risk Prevention Plan  Post Dischage Appt Complete  Complete  Medication Screening Complete  Complete  Transportation Screening Complete Complete Complete  PCP or Specialist Appt within 5-7 Days  Complete   Home Care Screening  Complete   Medication Review (RN CM)  Complete

## 2023-01-22 NOTE — Progress Notes (Signed)
Date of Admission:  01/17/2023      ID: Joseph [name] is a 42 y.o. male Principal Problem:   MSSA bacteremia Active Problems:   Chronic respiratory failure (HCC)   Essential hypertension   Severe persistent asthma   Acute midline low back pain without sciatica   Degeneration of intervertebral disc of lumbar region with discogenic back pain  Antibiotic- Cefazolin- 01/18/23  Subjective: Walked with PT  Medications:   celecoxib  200 mg Oral BID   Chlorhexidine Gluconate Cloth  6 each Topical Daily   DULoxetine  20 mg Oral Daily   enoxaparin (LOVENOX) injection  40 mg Subcutaneous QHS   feeding supplement  237 mL Oral TID BM   gabapentin  300 mg Oral TID   hydrochlorothiazide  25 mg Oral Daily   HYDROmorphone  2 mg Oral TID   insulin aspart  0-15 Units Subcutaneous TID WC   lidocaine  1 patch Transdermal Q24H   losartan  100 mg Oral Daily   montelukast  10 mg Oral QHS   multivitamin with minerals  1 tablet Oral Daily   pantoprazole  40 mg Oral Daily   predniSONE  7.5 mg Oral Q breakfast   QUEtiapine  25 mg Oral QHS   sodium chloride flush  10-40 mL Intracatheter Q12H   tiZANidine  2 mg Oral TID   umeclidinium bromide  1 puff Inhalation Daily    Objective: Vital signs in last 24 hours: Patient Vitals for the past 24 hrs:  BP Temp Pulse Resp SpO2  01/22/23 0759 116/78 97.8 F (36.6 C) (!) 110 16 96 %  01/21/23 2338 (!) 125/92 98 F (36.7 C) (!) 104 16 99 %  01/21/23 1537 118/81 98.7 F (37.1 C) -- 18 96 %       PHYSICAL EXAM:  General: Alert, cooperative, no distress, appears stated age.  Moon faced Lungs: b/l air entry- rhonchi Heart: Regular rate and rhythm, no murmur, rub or gallop. Abdomen: Soft,  distended. Bowel sounds normal. No masses, striae Extremities: atraumatic, no cyanosis. No edema. No clubbing Scar rt elbow Skin: No rashes or lesions. Or bruising Lymph: Cervical, supraclavicular normal. Neurologic: Grossly non-focal  Lab Results     Latest Ref Rng & Units 01/22/2023    2:19 AM 01/21/2023    1:59 AM 01/20/2023    3:18 AM  CBC  WBC 4.0 - 10.5 K/uL 22.0  21.9  18.0   Hemoglobin 13.0 - 17.0 g/dL 16.1  09.6  04.5   Hematocrit 39.0 - 52.0 % 36.7  38.1  41.5   Platelets 150 - 400 K/uL 397  346  371        Latest Ref Rng & Units 01/22/2023    2:19 AM 01/20/2023    3:18 AM 01/18/2023    5:05 AM  CMP  Glucose 70 - 99 mg/dL 409  98  811   BUN 6 - 20 mg/dL 31  18  24    Creatinine 0.61 - 1.24 mg/dL 9.14  7.82  9.56   Sodium 135 - 145 mmol/L 137  136  135   Potassium 3.5 - 5.1 mmol/L 3.6  3.9  3.7   Chloride 98 - 111 mmol/L 96  97  99   CO2 22 - 32 mmol/L 31  31  28    Calcium 8.9 - 10.3 mg/dL 8.8  8.5  8.0       Microbiology: [facility] 4/4 MSSA Studies/Results: [place] EKG SITE RITE  Result Date: 01/21/2023 If Site  Rite image not attached, placement could not be confirmed due to current cardiac rhythm.    Assessment/Plan: MSSA bacteremia- source of infection unclear Deep infection like discitis,abscess need to be ruled out especially with severe back pain MRIof the lumbar spine no discitis Left hip labral tear-  MRI left hip shows avascular necrosis  Continue cefazolin-  2 d echo no vegetation Will need TEE-scheduled fro Thursday. 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

## 2023-01-22 NOTE — Progress Notes (Signed)
Progress Note   [name] UJW:[id number] DOB: [birth year] DOA: 01/17/2023     5 DOS: the patient was seen and examined on 01/22/2023   Brief hospital course: 42yo with h/o asthma/COPD on chronic steroids and 2L home O2, DM, and HTN who presented on 12/4 with worsening LBP and SOB.  Imaging concerning for L4-S1 osteomyelitis (CT but not MRI) as well as a left iliac DVT.  Started on Heparin.  Blood cultures growing MSSA, started on Cefazolin. Underwent venogram on 12/5, no evidence of thrombus, vascular surgery signed off, Heparin discontinued.  ID is consulting, agreed with concern for discitis as the source but MRI is negative for discitis or epidural abscess so source is uncertain, will need hip MRI.  Neurosurgery consulted, nonoperative management, pain control (will ask palliative care to help).  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 Negative scrotal US.  Assessment and Plan:  MSSA Bacteremia Patient with h/o MRSA septic bursitis presenting with back pain. CT showed L4-S1 osteo/discitis but MRI (without contrast) did not show this definitively and MRI with contrast ruled this out as source There was concern for L iliac DVT, although venogram was negative so heparin stopped and vascular signed off Neurosurgery consulted and thinks intervention would not likely be needed Will need antibiotics for 4-6 weeks - currently on Cefazolin With vertebral osteo, needs testing for TB; ordered quantiferon gold and HIV is negative With bacteremia, ID consulted  Echo unremarkable, plan for TEE with [facility] Thursday, 12/12 PICC line placed Possible dc to CIR on 12/10 and can do TEE at [facility] instead, possibly earlier   Left AS labral tear/Spinal stenosis Patient with a history of chronic low back pain followed by [facility] orthopedic surgery now presenting with worsening low back pain and L hip pain  X-ray of the low back shows multilevel  degenerative changes of the L-spine, worse at L3-4.  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 Meredeth Ide in 09/2022 Lung auscultation reveals diffuse expiratory wheezes Status post IV Solu-Medrol and DuoNeb x 1 in the ED Continue supplemental oxygen Prednisone 40 mg daily x 2 days and then resume home dose of 7.5 mg daily Resume home Incruse Ellipta and Singulair Standing Duonebs and as needed prednisone for SOB and wheezing Continue Dupixent every 2 weeks Added IS and flutter valve Seen by anesthesia this AM and noted to have wheezing; given his high risk of failure with procedure, TEE was canceled Patient reports that he is at baseline and appears that way currently, but will ask pulmonology to consult to maximize success with TEE   DM New onset, A1c 6.8 May be related to steroid-induced hyperglycemia Will  add moderate-scale SSI (patient is declining)   HTN Resume home HCTZ and losartan   GERD Protonix 40 mg daily (in lieu of home omeprazole)   Depression Patient and wife report long-standing depression He has rage issues and is  reluctant to try SSRI Given his chronic pain, he is willing to trial Duloxetine   Class 1 Obesity Body mass index is 34.96 kg/m.Marland Kitchen  Weight loss should be encouraged Outpatient PCP/bariatric medicine f/u encouraged  Nutrition Problem: Inadequate oral intake Etiology: poor appetite Signs/Symptoms: per patient/family report Interventions: Ensure Enlive (each supplement provides 350kcal and 20 grams of protein), MVI, Liberalize Diet           Consultants: Vascular surgery ID Neurosurgery  Palliative care OT PT TOC team   Procedures: LLE venogram 12/5   Antibiotics: Ceftriaxone x 1 Vancomycin x 1 Cefazolin 12/5-  30 Day Unplanned Readmission Risk Score    Flowsheet Row ED to Hosp-Admission (Current) from 01/17/2023 in [facility] (1A)  30 Day Unplanned Readmission Risk Score (%) 14.75 Filed at 01/22/2023 0401       This score is the patient's risk of an unplanned readmission within 30 days of being discharged (0 -100%). The score is based on dignosis, age, lab data, medications, orders, and past utilization.   Low:  0-14.9   Medium: 15-21.9   High: 22-29.9   Extreme: 30 and above           Subjective: Doing fairly well this AM with improved pain control and stable breathing per his report.  Persistent wheezing, which is chronic for him.     Objective: Vitals:   01/22/23 1444 01/22/23 1556  BP: (!) 108/95 139/84  Pulse: (!) 128 (!) 133  Resp: 16   Temp: 98 F (36.7 C)   SpO2: 94% 95%    Intake/Output Summary (Last 24 hours) at 01/22/2023 1614 Last data filed at 01/22/2023 1059 Gross per 24 hour  Intake 1131.52 ml  Output 800 ml  Net 331.52 ml   Filed Weights   01/17/23 0912 01/18/23 0947  Weight: 81.2 kg 81.2 kg    Exam:  General:  Appears calm and more comfortable than on prior visits Eyes:  EOMI, normal lids, iris ENT:  grossly normal hearing, lips & tongue, mmm Neck:  no LAD, masses or  thyromegaly Cardiovascular:  RRR, no m/r/g. No LE edema.  Respiratory:   Mild persistent wheezing.  Normal respiratory effort. Abdomen:  soft, NT, ND Skin:  no rash or induration seen on limited exam Musculoskeletal:  grossly normal tone BUE/BLE, no bony abnormality Psychiatric:  blunted mood and affect, speech fluent and appropriate, AOx3 Neurologic:  CN 2-12 grossly intact, moves all extremities in coordinated fashion  Data Reviewed: I have reviewed the patient's lab results since admission.  Pertinent labs for today include:   CO2 31 WBC 22 Hgb 11.8     Family Communication: None present today  Disposition: Status is: Inpatient Remains inpatient appropriate because: ongoing management     Time spent: 50 minutes  Unresulted Labs (From admission, onward)     Start     Ordered   01/23/23 0500  CBC with Differential/Platelet  Tomorrow morning,   R        01/22/23 1614   01/23/23 0500  Basic metabolic panel  Tomorrow morning,   R        01/22/23 1614   01/19/23 0500  QuantiFERON-TB Gold Plus  Tomorrow morning,   R  01/18/23 1706             Author: [name], MD 01/22/2023 4:14 PM  For on call review [email or web address].

## 2023-01-22 NOTE — Progress Notes (Signed)
Physical Therapy Treatment Patient Details Name: Joseph [name] MRN: [id number] DOB: 04/22/1980 Today's Date: 01/22/2023   History of Present Illness Pt is a 42 y.o. male presenting to hospital 01/17/23 with c/o SOB and worsening LBP.  Imaging initially concerning for deep venous thrombosis of the left iliac vein.  Pt admitted with L4-S1 discitis with MSSA bacteremia, LBP, L AS labral tear, spinal stenosis, COPD/asthma exacerbation, and leukocytosis.  PMH includes h/o chronic hypoxia requiring supplemental O2 at home, COPD, chronic L hip pain secondary to labral tear, R olecranon bursectomy.    PT Comments  Pt ready for session.  He is able to transition to/from sitting with rails but no physical assist.  He continues to not log roll despite cues.  He is steady in sitting with BUE support but does "sway" side to side to relieve pain/pressure.  He is able to stand to walker with CGA x 1 and poor hand placements often pulling up on walker and not reaching back while sitting.  Pt has extremely strong upper body which does allow him to increase his gait distances today to 240' x 1 and 120' x 1 but pt estimates only putting about 10% of weight on LE's and relying on arms for 90%.  He is encouraged to decrease arm support and is able to for RLE but continues with LLE weakness where he reverts back to arms.  Standing ex at EOB with walker support.    Overall good progression of mobility skills today and tolerance for activity.  He remains a good CIR candidate.    If plan is discharge home, recommend the following: A little help with walking and/or transfers;A little help with bathing/dressing/bathroom;Assistance with cooking/housework;Assist for transportation;Help with stairs or ramp for entrance   Can travel by private vehicle        Equipment Recommendations  Rolling walker (2 wheels);BSC/3in1;Wheelchair cushion (measurements PT);Wheelchair (measurements PT)    Recommendations for Other Services        Precautions / Restrictions Precautions Precautions: Fall Restrictions Weight Bearing Restrictions: No     Mobility  Bed Mobility Overal bed mobility: Needs Assistance   Rolling: Modified independent (Device/Increase time) Sidelying to sit: Modified independent (Device/Increase time)     Sit to sidelying: Modified independent (Device/Increase time) General bed mobility comments: poor techniques especially sit to supine - does not log roll despite cues. Patient Response: Cooperative  Transfers Overall transfer level: Needs assistance Equipment used: Rolling walker (2 wheels) Transfers: Sit to/from Stand Sit to Stand: Contact guard assist                Ambulation/Gait Ambulation/Gait assistance: Contact guard assist, Min assist Gait Distance (Feet): 240 Feet Assistive device: Rolling walker (2 wheels) Gait Pattern/deviations: Step-through pattern, Decreased step length - right, Decreased step length - left Gait velocity: decreased     General Gait Details: 240, 120 with very heavy reliance on upper body   Stairs             Wheelchair Mobility     Tilt Bed Tilt Bed Patient Response: Cooperative  Modified Rankin (Stroke Patients Only)       Balance Overall balance assessment: Needs assistance Sitting-balance support: Bilateral upper extremity supported, Feet supported Sitting balance-Leahy Scale: Fair     Standing balance support: Bilateral upper extremity supported Standing balance-Leahy Scale: Fair Standing balance comment: heavy use of RW for support  Cognition Arousal: Alert Behavior During Therapy: WFL for tasks assessed/performed Overall Cognitive Status: Within Functional Limits for tasks assessed                                          Exercises Other Exercises Other Exercises: standing ex with walker and static standing without UE support/pregait/weigth shifting  activities.    General Comments        Pertinent Vitals/Pain Pain Assessment Pain Assessment: Faces Faces Pain Scale: Hurts even more Pain Location: LBP Pain Descriptors / Indicators: Aching, Constant, Discomfort, Grimacing, Guarding, Sharp, Shooting Pain Intervention(s): Limited activity within patient's tolerance, Monitored during session, Premedicated before session, Repositioned    Home Living                          Prior Function            PT Goals (current goals can now be found in the care plan section) Progress towards PT goals: Progressing toward goals    Frequency    Min 1X/week      PT Plan      Co-evaluation              AM-PAC PT "6 Clicks" Mobility   Outcome Measure  Help needed turning from your back to your side while in a flat bed without using bedrails?: None Help needed moving from lying on your back to sitting on the side of a flat bed without using bedrails?: A Little Help needed moving to and from a bed to a chair (including a wheelchair)?: A Little Help needed standing up from a chair using your arms (e.g., wheelchair or bedside chair)?: A Little Help needed to walk in hospital room?: A Little Help needed climbing 3-5 steps with a railing? : Total 6 Click Score: 17    End of Session Equipment Utilized During Treatment: Gait belt Activity Tolerance: Patient tolerated treatment well Patient left: in bed;with call bell/phone within reach;with bed alarm set;with family/visitor present Nurse Communication: Mobility status;Precautions;Other (comment) PT Visit Diagnosis: Other abnormalities of gait and mobility (R26.89);Muscle weakness (generalized) (M62.81);Pain     Time: 1347-1415 PT Time Calculation (min) (ACUTE ONLY): 28 min  Charges:    $Gait Training: 8-22 mins $Therapeutic Exercise: 8-22 mins PT General Charges $$ ACUTE PT VISIT: 1 Visit                    Danielle Dess, PTA 01/22/23, 2:59 PM

## 2023-01-23 DIAGNOSIS — R7881 Bacteremia: Secondary | ICD-10-CM | POA: Diagnosis not present

## 2023-01-23 DIAGNOSIS — B9561 Methicillin susceptible Staphylococcus aureus infection as the cause of diseases classified elsewhere: Secondary | ICD-10-CM | POA: Diagnosis not present

## 2023-01-23 DIAGNOSIS — M4626 Osteomyelitis of vertebra, lumbar region: Secondary | ICD-10-CM | POA: Diagnosis not present

## 2023-01-23 DIAGNOSIS — M545 Low back pain, unspecified: Secondary | ICD-10-CM | POA: Diagnosis not present

## 2023-01-23 DIAGNOSIS — M5136 Other intervertebral disc degeneration, lumbar region with discogenic back pain only: Secondary | ICD-10-CM | POA: Diagnosis not present

## 2023-01-23 LAB — RESPIRATORY PANEL BY PCR

## 2023-01-23 LAB — CBC WITH DIFFERENTIAL/PLATELET
Abs Immature Granulocytes: 2.04 10*3/uL — ABNORMAL HIGH (ref 0.00–0.07)
Basophils Absolute: 0.2 10*3/uL — ABNORMAL HIGH (ref 0.0–0.1)
Basophils Relative: 1 %
Eosinophils Absolute: 0.4 10*3/uL (ref 0.0–0.5)
Eosinophils Relative: 1 %
HCT: 37.8 % — ABNORMAL LOW (ref 39.0–52.0)
Hemoglobin: 12 g/dL — ABNORMAL LOW (ref 13.0–17.0)
Immature Granulocytes: 8 %
Lymphocytes Relative: 10 %
Lymphs Abs: 2.5 10*3/uL (ref 0.7–4.0)
MCH: 26.3 pg (ref 26.0–34.0)
MCHC: 31.7 g/dL (ref 30.0–36.0)
MCV: 82.7 fL (ref 80.0–100.0)
Monocytes Absolute: 3.4 10*3/uL — ABNORMAL HIGH (ref 0.1–1.0)
Monocytes Relative: 14 %
Neutro Abs: 15.9 10*3/uL — ABNORMAL HIGH (ref 1.7–7.7)
Neutrophils Relative %: 66 %
Platelets: 462 10*3/uL — ABNORMAL HIGH (ref 150–400)
RBC: 4.57 MIL/uL (ref 4.22–5.81)
RDW: 17.2 % — ABNORMAL HIGH (ref 11.5–15.5)
Smear Review: NORMAL
WBC: 24.4 10*3/uL — ABNORMAL HIGH (ref 4.0–10.5)
nRBC: 0 % (ref 0.0–0.2)

## 2023-01-23 LAB — BASIC METABOLIC PANEL
Anion gap: 8 (ref 5–15)
BUN: 22 mg/dL — ABNORMAL HIGH (ref 6–20)
CO2: 32 mmol/L (ref 22–32)
Calcium: 9.1 mg/dL (ref 8.9–10.3)
Chloride: 95 mmol/L — ABNORMAL LOW (ref 98–111)
Creatinine, Ser: 0.74 mg/dL (ref 0.61–1.24)
GFR, Estimated: >60 mL/min (ref >60)
Glucose, Bld: 105 mg/dL — ABNORMAL HIGH (ref 70–99)
Potassium: 3.7 mmol/L (ref 3.5–5.1)
Sodium: 135 mmol/L (ref 135–145)

## 2023-01-23 LAB — SARS CORONAVIRUS 2 BY RT PCR
SARS Coronavirus 2 by RT PCR: POSITIVE — AB
SARS Coronavirus 2 by RT PCR: NEGATIVE

## 2023-01-23 LAB — QUANTIFERON-TB GOLD PLUS (RQFGPL)
QuantiFERON Mitogen Value: 0.42 [IU]/mL
QuantiFERON Nil Value: 0.17 [IU]/mL
QuantiFERON TB1 Ag Value: 0.18 [IU]/mL
QuantiFERON TB2 Ag Value: 0.17 [IU]/mL

## 2023-01-23 LAB — GLUCOSE, CAPILLARY
Glucose-Capillary: 142 mg/dL — ABNORMAL HIGH (ref 70–99)
Glucose-Capillary: 121 mg/dL — ABNORMAL HIGH (ref 70–99)
Glucose-Capillary: 159 mg/dL — ABNORMAL HIGH (ref 70–99)
Glucose-Capillary: 142 mg/dL — ABNORMAL HIGH (ref 70–99)

## 2023-01-23 LAB — STREP PNEUMONIAE URINARY ANTIGEN: Strep Pneumo Urinary Antigen: NEGATIVE

## 2023-01-23 LAB — QUANTIFERON-TB GOLD PLUS: QuantiFERON-TB Gold Plus: UNDETERMINED — AB

## 2023-01-23 MED ORDER — ENSURE ENLIVE PO LIQD
237.0000 mL | Freq: Two times a day (BID) | ORAL | Status: DC
Start: 2023-01-24 — End: 2023-01-25

## 2023-01-23 MED ORDER — DEXAMETHASONE SODIUM PHOSPHATE 10 MG/ML IJ SOLN
8.0000 mg | Freq: Two times a day (BID) | INTRAMUSCULAR | Status: DC
  Administered 2023-01-23 – 2023-01-24 (×3): 8 mg via INTRAVENOUS
  Filled 2023-01-23 (×3): qty 0.8

## 2023-01-23 NOTE — Plan of Care (Signed)
  Problem: Education: Goal: Knowledge of General Education information will improve Description: Including pain rating scale, medication(s)/side effects and non-pharmacologic comfort measures Outcome: Progressing   Problem: Clinical Measurements: Goal: Ability to maintain clinical measurements within normal limits will improve Outcome: Progressing Goal: Diagnostic test results will improve Outcome: Progressing   Problem: Activity: Goal: Risk for activity intolerance will decrease Outcome: Progressing   Problem: Nutrition: Goal: Adequate nutrition will be maintained Outcome: Progressing   Problem: Pain Management: Goal: General experience of comfort will improve Outcome: Progressing

## 2023-01-23 NOTE — Progress Notes (Signed)
Progress Note   [name] [id number]:[id number] DOB: 08-Feb-1981 DOA: 01/17/2023     6 DOS: the patient was seen and examined on 01/23/2023   Brief hospital course: 42yo with h/o asthma/COPD on chronic steroids and 2L home O2, DM, and HTN who presented on 12/4 with worsening LBP and SOB.  Imaging concerning for L4-S1 osteomyelitis (CT but not MRI) as well as a left iliac DVT.  Started on Heparin.  Blood cultures growing MSSA, started on Cefazolin. Underwent venogram on 12/5, no evidence of thrombus, vascular surgery signed off, Heparin discontinued.  ID is consulting, agreed with concern for discitis as the source but MRI is negative for discitis or epidural abscess so source is uncertain, will need hip MRI.  Neurosurgery consulted, nonoperative management, pain control (will ask palliative care to help).  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 Negative scrotal US.  Assessment and Plan:   MSSA Bacteremia Patient with h/o MRSA septic bursitis presenting with back pain. CT showed L4-S1 osteo/discitis but MRI (without contrast) did not show this definitively and MRI with contrast ruled this out as source There was concern for L iliac DVT, although venogram was negative so heparin stopped and vascular signed off Neurosurgery consulted and thinks intervention would not likely be needed Will need antibiotics for 4-6 weeks - currently on Cefazolin With vertebral osteo, needs testing for TB; ordered quantiferon gold and HIV is negative With bacteremia, ID consulted  Echo unremarkable, plan for TEE with [facility] Thursday, 12/12 - likely will need to be canceled due to COVID infection PICC line placed If TEE cannot be done due to respiratory status, he will need a minimum of 4 weeks of IV Cefazolin per ID   COPD/asthma exacerbation with COVID-19 infection Patient with persistent asthma and COPD on chronic steroids and chronic 2 L [place] at home Last  saw pulm Dr. Meredeth Ide in 09/2022 Lung auscultation reveals diffuse expiratory wheezes since admission but these have worsened so pulm was consulted Tested positive for COVID-19 infection on 12/10 Continue supplemental oxygen Continue steroid burst (also on chronic steroids at home) Resume home Incruse Ellipta and Singulair Continue bronchodilators On Dupixent every 2 weeks - hold next dose as this can exacerbate wheezing Added IS and flutter valve Fortunately, he currently has mild nasal congestion and persistent wheezing but does not appear to be seriously ill  Left AS labral tear/Spinal stenosis Patient with a history of chronic low back pain followed by [facility] orthopedic surgery now presenting with worsening low back pain and L hip pain  X-ray of the low back shows multilevel degenerative changes of the L-spine, worse at L3-4.  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 along with small L labral tear; this likely explains his pain although does not give a source for his bacteremia Added Celebrex for further pain control Pain control with PO Dilaudid TID + Oxy prn breakthrough   DM New onset, A1c 6.8 May be related to steroid-induced hyperglycemia Will add moderate-scale SSI (patient is declining)   HTN Resume home HCTZ and losartan   GERD Protonix 40 mg daily (in lieu of home omeprazole)  Depression Patient and wife report long-standing depression He has rage issues and is reluctant to try SSRI Given his chronic pain, he is willing to trial Duloxetine   Class 1  Obesity Body mass index is 34.96 kg/m.Marland Kitchen  Weight loss should be encouraged Outpatient PCP/bariatric medicine f/u encouraged  Nutrition Problem: Inadequate oral intake Etiology: poor appetite Signs/Symptoms: per patient/family report Interventions: Ensure Enlive (each supplement provides 350kcal and 20 grams of protein), MVI, Liberalize Diet           Consultants: Vascular surgery ID Neurosurgery  Palliative care OT PT TOC team   Procedures: LLE venogram 12/5   Antibiotics: Ceftriaxone x 1 Vancomycin x 1 Cefazolin 12/5-  30 Day Unplanned Readmission Risk Score    Flowsheet Row ED to Hosp-Admission (Current) from 01/17/2023 in [facility] (1A)  30 Day Unplanned Readmission Risk Score (%) 12.73 Filed at 01/23/2023 0801       This score is the patient's risk of an unplanned readmission within 30 days of being discharged (0 -100%). The score is based on dignosis, age, lab data, medications, orders, and past utilization.   Low:  0-14.9   Medium: 15-21.9   High: 22-29.9   Extreme: 30 and above           Subjective: Feeling ok.  Ongoing wheezing without significant concerns.  Working on mobility.  Disappointed that IV Dilaudid is gone but able to mobilize with PO narcotics at this time.   Objective: Vitals:   01/23/23 0718 01/23/23 1130  BP: 120/84   Pulse:  (!) 148  Resp: 19   Temp: 98.4 F (36.9 C)   SpO2: 96%     Intake/Output Summary (Last 24 hours) at 01/23/2023 1240 Last data filed at 01/23/2023 0540 Gross per 24 hour  Intake --  Output 1150 ml  Net -1150 ml   Filed Weights   01/17/23 0912 01/18/23 0947  Weight: 81.2 kg 81.2 kg    Exam:  General:  Appears calm and comfortable, on 2L [place] O2 Eyes:  EOMI, normal lids, iris ENT:  grossly normal hearing, lips & tongue, mmm Neck:  no LAD, masses or thyromegaly Cardiovascular:  RRR, no m/r/g. No LE edema.  Respiratory:   Mild persistent wheezing.  Normal respiratory  effort. Abdomen:  soft, NT, ND Skin:  no rash or induration seen on limited exam Musculoskeletal:  grossly normal tone BUE/BLE, no bony abnormality; L hip pain with movement but it is no longer debilitating Psychiatric:  blunted mood and affect, speech fluent and appropriate, AOx3 Neurologic:  CN 2-12 grossly intact, moves all extremities in coordinated fashion  Data Reviewed: I have reviewed the patient's lab results since admission.  Pertinent labs for today include:   Stable BMP WBC 24.4 Hgb 12 Platelets 462 COVID positive     Family Communication: None present; I spoke with his wife by telephone  Disposition: Status is: Inpatient Remains inpatient appropriate because: now with COVID     Time spent: 50 minutes  Unresulted Labs (From admission, onward)     Start     Ordered   01/24/23 0500  C-reactive protein  Tomorrow morning,   R        01/23/23 0807   01/24/23 0500  CBC with Differential/Platelet  Tomorrow morning,   R        01/23/23 1240   01/24/23 0500  Basic metabolic panel  Tomorrow morning,   R        01/23/23 1240  01/23/23 0809  Strep pneumoniae urinary antigen  Once,   R        01/23/23 0808   01/23/23 0809  Legionella pneumophila Total Ab  Once,   R        01/23/23 0808   01/23/23 0806  Respiratory (~20 pathogens) panel by PCR  (Respiratory panel by PCR (~20 pathogens, ~24 hr TAT)  w precautions)  Once,   R        01/23/23 0805   01/19/23 0500  QuantiFERON-TB Gold Plus  Tomorrow morning,   R        01/18/23 1706             Author: [name], MD 01/23/2023 12:40 PM  For on call review [email or web address].

## 2023-01-23 NOTE — Plan of Care (Signed)

## 2023-01-23 NOTE — Progress Notes (Signed)
Palliative Care Progress Note, Assessment & Plan   Patient Name: Joseph [name]       Date: 01/23/2023 DOB: 04-30-1980  Age: 42 y.o. MRN#: [id number] Attending Physician: [name], MD Primary Care Physician: Wilford Corner, [name] Admit Date: 01/17/2023  Subjective: Patient is lying flat in bed.  He acknowledges my presence and is able to make his wishes known.  No family or friends present during my visit.  HPI: 42 y.o. male  with past medical history of severe persistent asthma COPD overlap syndrome, steroid-dependent, on dupulimab, allergic rhinitis, elevated IgE, eczema ,nodular infiltrate lungs in 2019, MRSA rt olecranon bursitis in Feb 2024  admitted on 01/17/2023 with worsening low back pain.   Pain was so severe that patient fell in the bedroom floor and cannot pick himself back up.  Thus, EMS was called and he was brought to the ED.  Patient endorses chronic low back pain but it worsened in the 24 to 48 hours prior to admission   PMT was consulted to assist with pain control as workup is pending for etiology of acute back pain  Summary of counseling/coordination of care: Extensive chart review completed prior to meeting patient including labs, vital signs, imaging, progress notes, orders, and available advanced directive documents from current and previous encounters.   After reviewing the patient's chart and assessing the patient at bedside, spoke with patient in regards to symptom management and goals of care.  Symptoms assessed.  Patient endorses his pain is much better since our last visit several days ago.  He endorses some of the medicine makes him feel loopy but that overall his pain is greatly improved.  He is very proud of himself that he is able to be more mobile.  He shows me  how a log rolls and sits up on the side of the bed.  He is in much less pain than my last assessment.  He was essentially immobile and in excruciating pain prior to interventions.  Current regimen appears to be adequately managing patient's pain.  No adjustment to [facility] needed.  Advance directives, CODE STATUS, and boundaries of care discussed with patient.  In the event that patient is unable to speak for himself, his wife would be his next of kin decision maker.  In the event of a cardiopulmonary arrest, patient endorses he would be accepting of all offered, available, and appropriate medical interventions to sustain his life.  He is in agreement with placement of the ventilator.  However, if he is unable to come off of life support, he would never be accepting of a tracheostomy or long-term ventilatory support.  Full code and full scope remain.  Discussed with attending.  Attending made aware that PMT will shadow the patient's chart.  Please reengage with PMT if goals change, at patient/family's request, or if patient's health deteriorates during hospitalization.  Physical Exam Vitals reviewed.  Constitutional:      General: He is not in acute distress.    Appearance: He is normal weight.  HENT:     Head: Normocephalic.     Mouth/Throat:     Mouth: Mucous membranes are moist.  Eyes:     Pupils: Pupils are equal,  round, and reactive to light.  Pulmonary:     Effort: Pulmonary effort is normal.  Abdominal:     Palpations: Abdomen is soft.  Skin:    General: Skin is warm and dry.  Neurological:     Mental Status: He is alert and oriented to person, place, and time.  Psychiatric:        Mood and Affect: Mood normal.        Behavior: Behavior normal.        Thought Content: Thought content normal.        Judgment: Judgment normal.             Total Time 50 minutes   Time spent includes: Detailed review of medical records (labs, imaging, vital signs), medically appropriate exam  (mental status, respiratory, cardiac, skin), discussed with treatment team, counseling and educating patient, family and staff, documenting clinical information, medication management and coordination of care.  Samara Deist L. Bonita Quin, DNP, FNP-BC Palliative Medicine Team

## 2023-01-23 NOTE — Plan of Care (Signed)
  Problem: Education: Goal: Knowledge of General Education information will improve Description: Including pain rating scale, medication(s)/side effects and non-pharmacologic comfort measures 01/23/2023 1247 by Frann Rider D, LPN Outcome: Progressing 01/23/2023 1155 by Frann Rider D, LPN Outcome: Progressing   Problem: Activity: Goal: Risk for activity intolerance will decrease 01/23/2023 1247 by Frann Rider D, LPN Outcome: Progressing 01/23/2023 1155 by Frann Rider D, LPN Outcome: Progressing   Problem: Nutrition: Goal: Adequate nutrition will be maintained 01/23/2023 1247 by Frann Rider D, LPN Outcome: Progressing 01/23/2023 1155 by Frann Rider D, LPN Outcome: Progressing   Problem: Safety: Goal: Ability to remain free from injury will improve 01/23/2023 1247 by Frann Rider D, LPN Outcome: Progressing 01/23/2023 1155 by Frann Rider D, LPN Outcome: Progressing   Problem: Skin Integrity: Goal: Risk for impaired skin integrity will decrease 01/23/2023 1247 by Frann Rider D, LPN Outcome: Progressing 01/23/2023 1155 by Frann Rider D, LPN Outcome: Progressing

## 2023-01-23 NOTE — Progress Notes (Signed)
Inpatient Rehab Admissions Coordinator:   Per documentation yesterday TEE cancelled due to concern for high risk of failure with procedure.  Pulmonology consult this AM to optimize for procedure and switched prednisone to decadron.  Discussed with Dr. Riley Kill who feels more comfortable with TEE being performed in the acute setting prior to admit to CIR.  I let Dr. Ophelia Charter know.  I will follow.   Estill Dooms, PT, DPT Admissions Coordinator [phone] 01/23/23  10:09 AM

## 2023-01-23 NOTE — Progress Notes (Signed)
Nutrition Follow-up  DOCUMENTATION CODES:   Obesity unspecified  INTERVENTION:   -Continue regular diet -Continue MVI with minerals daily -Decrease Ensure Enlive po to BID, each supplement provides 350 kcal and 20 grams of protein  NUTRITION DIAGNOSIS:   Inadequate oral intake related to poor appetite as evidenced by per patient/family report.  Ongoing  GOAL:   Patient will meet greater than or equal to 90% of their needs  Progressing   MONITOR:   PO intake, Supplement acceptance  REASON FOR ASSESSMENT:   Consult Assessment of nutrition requirement/status  ASSESSMENT:   Pt with medical history significant of asthma/COPD on chronic steroids, chronic hypoxic respiratory failure on 2 L [place] at home, T2DM, chronic left hip pain and HTN who presented for evaluation of worsening low back pain and shortness of breath  12/5- s/p abdominal aortogram with lower extremity   Reviewed I/O's: -1.7 L x 24 hours and -2.6 L since admission  UOP: 1.7 L x 24 hours  Per MD notes, MRI of hip with L femoral head osteonecrosis and tendinosis along with small L labral tear. TEE will likely be done after hospitalization.   Spoke with pt at bedside, who was pleasant and in good spirits today. Pt is a lot more talkative and animated in comparison to last visit. He reports feeling better and making good progress with therapy ("I don't think I need to go to that rehab anymore").   Pt with improved oral intake. Observed meal tray- pt consumed all but a few bites of quesadilla. Pt shares that he likes the Ensure supplements, however, reports he thinks that drinking 3 is too many. Discussed decreasing frequency due to improved oral intake. Reviewed importance of continued good oral and supplement intake to promote healing. Pt expressed appreciation for care given.   No new wt since last visit.   Per TOC notes, plan for CIR vs home health.   Medications reviewed and include decadron, lovenox,  neurontin, and protonix.   Labs reviewed: CBGS: 108-142 (inpatient orders for glycemic control are 0-15 units insulin aspart TID with meals).    Diet Order:   Diet Order             Diet regular Room service appropriate? Yes; Fluid consistency: Thin  Diet effective now                   EDUCATION NEEDS:   Education needs have been addressed  Skin:  Skin Assessment: Reviewed RN Assessment  Last BM:  01/22/23 (type 3)  Height:   Ht Readings from Last 1 Encounters:  01/18/23 5' (1.524 m)    Weight:   Wt Readings from Last 1 Encounters:  01/18/23 81.2 kg    Ideal Body Weight:  48.2 kg  BMI:  Body mass index is 34.96 kg/m.  Estimated Nutritional Needs:   Kcal:  1700-1900  Protein:  90-105 grams  Fluid:  > 1.7 L    Levada Schilling, RD, LDN, CDCES Registered Dietitian III Certified Diabetes Care and Education Specialist Please refer to [facility] for RD and/or RD on-call/weekend/after hours pager

## 2023-01-23 NOTE — Consult Note (Addendum)
PULMONOLOGY         Date: 01/23/2023,   MRN# [id number] Joseph [name] 20-Feb-1980     AdmissionWeight: 81.2 kg                 CurrentWeight: 81.2 kg  Referring provider: Dr Ophelia Charter   CHIEF COMPLAINT:   Right upper lobe lung mass    HISTORY OF PRESENT ILLNESS   This is a 42 year old male with a history of atopic allergic moderate persistent asthma with atopic dermatitis and eczema, essential hypertension, COPD currently on Dupixent who came in with worsening respiratory status  and back pain. He was here in 08/2021 with AECOPD and  episodes of hypertension tachycardia had CT chest done with findings of bronchitic changes bilaterally as well as a infiltrate of the right upper lobe with areas of paraseptal emphysema worse in the upper lobes bilaterally there are also multiple old rib fractures seen bilaterally.  He had leukocytosis on arrival and is being treated for community-acquired pneumonia. Initial vitals with temp 98.2, RR 19, HR 114, BP 130/90, SpO2 98% on 2 L [place].  MRI of spine recommended. Patient started on IV vancomycin and Rocephin for potential osteomyelitis. Received IV Toradol, IV morphine and Dilaudid for pain. Received IV Solu-Medrol and DuoNeb for COPD/Asthma exacerbation He is supposed to have TEE and was not able to get this done due to respiratory issues.  PCCM consultation to optimize pulmonary function prior to surgery.   PAST MEDICAL HISTORY   Past Medical History:  Diagnosis Date   Asthma    COPD (chronic obstructive pulmonary disease) (HCC)    Eczema    Hypertension      SURGICAL HISTORY   Past Surgical History:  Procedure Laterality Date   ABDOMINAL AORTOGRAM W/LOWER EXTREMITY Left 01/18/2023   Procedure: ABDOMINAL AORTOGRAM W/LOWER EXTREMITY;  Surgeon: Renford Dills, MD;  Location: [facility] INVASIVE CV LAB;  Service: Cardiovascular;  Laterality: Left;   OLECRANON BURSECTOMY Right 03/16/2022   Procedure: I&D OLECRANON (ELBOW) BURSA;   Surgeon: Signa Kell, MD;  Location: [facility] ORS;  Service: Orthopedics;  Laterality: Right;     FAMILY HISTORY   Family History  Problem Relation Age of Onset   Hypertension Mother      SOCIAL HISTORY   Social History   Tobacco Use   Smoking status: Every Day    Current packs/day: 0.50    Types: Cigarettes   Smokeless tobacco: Never  Substance Use Topics   Alcohol use: No   Drug use: Yes    Types: Marijuana     MEDICATIONS    Home Medication:    Current Medication:  Current Facility-Administered Medications:    acetaminophen (TYLENOL) tablet 650 mg, 650 mg, Oral, Q6H PRN, 650 mg at 01/18/23 1640 **OR** acetaminophen (TYLENOL) suppository 650 mg, 650 mg, Rectal, Q6H PRN, [name], MD   albuterol (PROVENTIL) (2.5 MG/3ML) 0.083% nebulizer solution 2.5 mg, 2.5 mg, Nebulization, Q2H PRN, [name], MD, 2.5 mg at 01/22/23 2013   alum & mag hydroxide-simeth (MAALOX/MYLANTA) 200-200-20 MG/5ML suspension 30 mL, 30 mL, Oral, Q4H PRN, [name], MD, 30 mL at 01/21/23 1409   ceFAZolin (ANCEF) IVPB 2g/100 mL premix, 2 g, Intravenous, Q8H, [name], MD, Last Rate: 200 mL/hr at 01/23/23 0011, 2 g at 01/23/23 0011   celecoxib (CELEBREX) capsule 200 mg, 200 mg, Oral, BID, [name], MD, 200 mg at 01/22/23 2104   Chlorhexidine Gluconate Cloth 2 % PADS 6 each, 6 each, Topical, Daily, Yates,  Victorino Dike, MD, 6 each at 01/22/23 1300   DULoxetine (CYMBALTA) DR capsule 20 mg, 20 mg, Oral, Daily, [name], MD, 20 mg at 01/21/23 1013   enoxaparin (LOVENOX) injection 40 mg, 40 mg, Subcutaneous, QHS, [name], MD, 40 mg at 01/22/23 2106   feeding supplement (ENSURE ENLIVE / ENSURE PLUS) liquid 237 mL, 237 mL, Oral, TID BM, [name], MD, 237 mL at 01/22/23 2013   gabapentin (NEURONTIN) capsule 300 mg, 300 mg, Oral, TID, Georgiann Cocker, FNP, 300 mg at 01/22/23 2104   hydrochlorothiazide (HYDRODIURIL) tablet 25 mg, 25 mg, Oral, Daily, [name], MD, 25 mg at 01/22/23 1743   HYDROmorphone (DILAUDID) tablet 2 mg, 2 mg, Oral, TID, Georgiann Cocker, FNP, 2 mg at 01/22/23 2104   insulin aspart (novoLOG) injection 0-15 Units, 0-15 Units, Subcutaneous, TID WC, [name], MD, 3 Units at 01/19/23 1622   lidocaine (LIDODERM) 5 % 1 patch, 1 patch, Transdermal, Q24H, Georgiann Cocker, FNP, 1 patch at 01/22/23 1301   losartan (COZAAR) tablet 100 mg, 100 mg, Oral, Daily, [name], MD, 100 mg at 01/21/23 1013   montelukast (SINGULAIR) tablet 10 mg, 10 mg, Oral, QHS, [name], MD, 10 mg at 01/22/23 2105   multivitamin with minerals tablet 1 tablet, 1 tablet, Oral, Daily, [name], MD, 1 tablet at 01/21/23 1013   ondansetron (ZOFRAN) tablet 4 mg, 4 mg, Oral, Q6H PRN **OR** ondansetron (ZOFRAN) injection 4 mg, 4 mg, Intravenous, Q6H PRN, [name], MD   oxyCODONE (Oxy IR/ROXICODONE) immediate release tablet 5-10 mg, 5-10 mg, Oral, Q4H PRN, [name], MD, 10 mg at 01/23/23 0540   pantoprazole (PROTONIX) EC tablet 40 mg, 40 mg, Oral, Daily, [name], MD, 40 mg at 01/21/23 1013   predniSONE (DELTASONE) tablet 7.5 mg, 7.5 mg, Oral, Q breakfast, [name], MD, 7.5 mg at 01/21/23 5784   QUEtiapine (SEROQUEL) tablet 25 mg, 25 mg, Oral, QHS, Georgiann Cocker, FNP, 25 mg at 01/22/23 2106   senna-docusate (Senokot-S) tablet 1 tablet, 1 tablet, Oral, QHS PRN, [name], MD   sodium chloride flush (NS) 0.9 % injection 10-40 mL, 10-40 mL, Intracatheter, Q12H, [name], MD, 10 mL at 01/22/23 2107   sodium chloride flush (NS) 0.9 % injection 10-40 mL, 10-40 mL, Intracatheter, PRN, [name], MD   tiZANidine (ZANAFLEX) tablet 2 mg, 2 mg, Oral, TID, [name], MD, 2 mg at 01/22/23 2105   umeclidinium bromide (INCRUSE ELLIPTA) 62.5 MCG/ACT 1 puff, 1 puff, Inhalation, Daily, [name], MD, 1 puff at 01/22/23 6962    ALLERGIES   Penicillins, Pork-derived  products, Tomato, Banana, and Coconut (cocos nucifera)     REVIEW OF SYSTEMS    Review of Systems:  Gen:  Denies  fever, sweats, chills weigh loss  HEENT: Denies blurred vision, double vision, ear pain, eye pain, hearing loss, nose bleeds, sore throat Cardiac:  No dizziness, chest pain or heaviness, chest tightness,edema Resp:   reports dyspnea chronically  Gi: Denies swallowing difficulty, stomach pain, nausea or vomiting, diarrhea, constipation, bowel incontinence Gu:  Denies bladder incontinence, burning urine Ext:   Denies Joint pain, stiffness or swelling Skin: Denies  skin rash, easy bruising or bleeding or hives Endoc:  Denies polyuria, polydipsia , polyphagia or weight change Psych:   Denies depression, insomnia or hallucinations   Other:  All other systems negative   VS: BP 120/84 (BP Location: Left Arm)   Pulse (!) 113   Temp 98.4 F (36.9  C)   Resp 19   Ht 5' (1.524 m)   Wt 81.2 kg   SpO2 96%   BMI 34.96 kg/m      PHYSICAL EXAM    GENERAL:NAD, no fevers, chills, no weakness no fatigue HEAD: Normocephalic, atraumatic.  EYES: Pupils equal, round, reactive to light. Extraocular muscles intact. No scleral icterus.  MOUTH: Moist mucosal membrane. Dentition intact. No abscess noted.  EAR, NOSE, THROAT: Clear without exudates. No external lesions.  NECK: Supple. No thyromegaly. No nodules. No JVD.  PULMONARY: decreased breath sounds with mild rhonchi worse at bases bilaterally.  +wheezing CARDIOVASCULAR: S1 and S2. Regular rate and rhythm. No murmurs, rubs, or gallops. No edema. Pedal pulses 2+ bilaterally.  GASTROINTESTINAL: Soft, nontender, nondistended. No masses. Positive bowel sounds. No hepatosplenomegaly.  MUSCULOSKELETAL: No swelling, clubbing, or edema. Range of motion full in all extremities.  NEUROLOGIC: Cranial nerves II through XII are intact. No gross focal neurological deficits. Sensation intact. Reflexes intact.  SKIN: No ulceration, lesions,  rashes, or cyanosis. Skin warm and dry. Turgor intact.  PSYCHIATRIC: Mood, affect within normal limits. The patient is awake, alert and oriented x 3. Insight, judgment intact.       IMAGING      Narrative & Impression  CLINICAL DATA:  Shortness of breath.   EXAM: PORTABLE CHEST 1 VIEW   COMPARISON:  August 29, 2021.  October 13, 2022.   FINDINGS: The heart size and mediastinal contours are within normal limits. Both lungs are clear. Old bilateral rib fractures are noted.   IMPRESSION: No active disease.     Electronically Signed   By: Lupita Raider M.D.   On: 01/17/2023 09:57    ASSESSMENT/PLAN   Acute on chronic hypoxemic respiratory failure - present on admission  - COVID19 negative 08/21/21  - supplemental O2 during my evaluation 3L/min - will perform infectious workup for pneumonia -legionella ab -strep pneumoniae ur AG -Histoplasma Ur Ag -sputum resp cultures -reviewed pertinent imaging with patient today - CRP trending -Decadron 8 mg BID, I have dcd prednisone 7.5mg  -noted antibiotics for MSSA bacteramia -PT/OT for d/c planning  -please encourage patient to use incentive spirometer few times each hour while hospitalized.       Right lung masslike infiltrate   Continue pneumonia therapy , will re-image post full scope of treatment         Thank you for allowing me to participate in the care of this patient.   Patient/Family are satisfied with care plan and all questions have been answered.    Provider disclosure: Patient with at least one acute or chronic illness or injury that poses a threat to life or bodily function and is being managed actively during this encounter.  All of the below services have been performed independently by signing provider:  review of prior documentation from internal and or external health records.  Review of previous and current lab results.  Interview and comprehensive assessment during patient visit today. Review of  current and previous chest radiographs/CT scans. Discussion of management and test interpretation with health care team and patient/family.   This document was prepared using Dragon voice recognition software and may include unintentional dictation errors.     Vida Rigger, M.D.  Division of Pulmonary & Critical Care Medicine

## 2023-01-23 NOTE — Progress Notes (Signed)
Physical Therapy Treatment Patient Details Name: Joseph [name] MRN: [id number] DOB: [birth year] Today's Date: 01/23/2023   History of Present Illness Pt is a 42 y.o. male presenting to hospital 01/17/23 with c/o SOB and worsening LBP.  Imaging initially concerning for deep venous thrombosis of the left iliac vein.  Pt admitted with L4-S1 discitis with MSSA bacteremia, LBP, L AS labral tear, spinal stenosis, COPD/asthma exacerbation, and leukocytosis.  PMH includes h/o chronic hypoxia requiring supplemental O2 at home, COPD, chronic L hip pain secondary to labral tear, R olecranon bursectomy.    PT Comments  Pt in bed,  reports increased pain in back today which he attributed to him pushing himself too far yesterday.  He is able to get to EOB with rail and supervision.  Steady in sitting.  He is able to stand and walk 50' x 2 and 20' x 2 with continued heavy use of walker and reports LLE "feels like Jello"  He does note upon sitting though he is able to WB on L hip today and does not sway left/right to relieve pressure that he is very happy about and opts to sit in chair after session with alarm on and needs met.  Of note, HR is high today running 130's -high 140's at rest. It does not seem to increase with mobility and pt reports feeling well. Gait is limited by Clinical research associate and rest breaks are given as pt would still like to walk.    If plan is discharge home, recommend the following: A little help with walking and/or transfers;A little help with bathing/dressing/bathroom;Assistance with cooking/housework;Assist for transportation;Help with stairs or ramp for entrance   Can travel by private vehicle        Equipment Recommendations  Rolling walker (2 wheels);BSC/3in1    Recommendations for Other Services       Precautions / Restrictions Precautions Precautions: Fall Precaution Comments: watch HR Restrictions Weight Bearing Restrictions: No     Mobility  Bed Mobility Overal bed mobility:  Modified Independent               Patient Response: Cooperative  Transfers Overall transfer level: Needs assistance Equipment used: Rolling walker (2 wheels) Transfers: Sit to/from Stand Sit to Stand: Contact guard assist                Ambulation/Gait Ambulation/Gait assistance: Contact guard assist Gait Distance (Feet): 50 Feet Assistive device: Rolling walker (2 wheels) Gait Pattern/deviations: Step-through pattern, Decreased step length - right, Decreased step length - left Gait velocity: decreased     General Gait Details: 50' x 2 limited due to inc HR today but pt still wanting to move some   Stairs             Wheelchair Mobility     Tilt Bed Tilt Bed Patient Response: Cooperative  Modified Rankin (Stroke Patients Only)       Balance Overall balance assessment: Needs assistance Sitting-balance support: Bilateral upper extremity supported, Feet supported Sitting balance-Leahy Scale: Good     Standing balance support: Bilateral upper extremity supported Standing balance-Leahy Scale: Fair Standing balance comment: heavy use of RW for support                            Cognition Arousal: Alert Behavior During Therapy: WFL for tasks assessed/performed Overall Cognitive Status: Within Functional Limits for tasks assessed  Exercises      General Comments        Pertinent Vitals/Pain Pain Assessment Pain Assessment: Faces Faces Pain Scale: Hurts little more Pain Location: LBP Pain Descriptors / Indicators: Aching, Constant, Discomfort, Grimacing, Guarding, Sharp, Shooting Pain Intervention(s): Limited activity within patient's tolerance, Monitored during session, Repositioned, Premedicated before session    Home Living                          Prior Function            PT Goals (current goals can now be found in the care plan section) Progress  towards PT goals: Progressing toward goals    Frequency    Min 1X/week      PT Plan      Co-evaluation              AM-PAC PT "6 Clicks" Mobility   Outcome Measure  Help needed turning from your back to your side while in a flat bed without using bedrails?: None Help needed moving from lying on your back to sitting on the side of a flat bed without using bedrails?: A Little Help needed moving to and from a bed to a chair (including a wheelchair)?: A Little Help needed standing up from a chair using your arms (e.g., wheelchair or bedside chair)?: A Little Help needed to walk in hospital room?: A Little Help needed climbing 3-5 steps with a railing? : Total 6 Click Score: 17    End of Session Equipment Utilized During Treatment: Gait belt Activity Tolerance: Patient tolerated treatment well Patient left: with call bell/phone within reach;with family/visitor present;in chair;with chair alarm set Nurse Communication: Mobility status;Precautions;Other (comment) PT Visit Diagnosis: Other abnormalities of gait and mobility (R26.89);Muscle weakness (generalized) (M62.81);Pain     Time: 6237-6283 PT Time Calculation (min) (ACUTE ONLY): 19 min  Charges:    $Gait Training: 8-22 mins PT General Charges $$ ACUTE PT VISIT: 1 Visit                   Danielle Dess, PTA 01/23/23, 11:36 AM

## 2023-01-24 DIAGNOSIS — R7881 Bacteremia: Secondary | ICD-10-CM | POA: Diagnosis not present

## 2023-01-24 DIAGNOSIS — B9561 Methicillin susceptible Staphylococcus aureus infection as the cause of diseases classified elsewhere: Secondary | ICD-10-CM | POA: Diagnosis not present

## 2023-01-24 LAB — BASIC METABOLIC PANEL
Anion gap: 9 (ref 5–15)
BUN: 31 mg/dL — ABNORMAL HIGH (ref 6–20)
CO2: 31 mmol/L (ref 22–32)
Calcium: 8.8 mg/dL — ABNORMAL LOW (ref 8.9–10.3)
Chloride: 95 mmol/L — ABNORMAL LOW (ref 98–111)
Creatinine, Ser: 0.94 mg/dL (ref 0.61–1.24)
GFR, Estimated: >60 mL/min (ref >60)
Glucose, Bld: 134 mg/dL — ABNORMAL HIGH (ref 70–99)
Potassium: 4.2 mmol/L (ref 3.5–5.1)
Sodium: 135 mmol/L (ref 135–145)

## 2023-01-24 LAB — CBC WITH DIFFERENTIAL/PLATELET
Abs Immature Granulocytes: 2.49 10*3/uL — ABNORMAL HIGH (ref 0.00–0.07)
Basophils Absolute: 0.1 10*3/uL (ref 0.0–0.1)
Basophils Relative: 1 %
Eosinophils Absolute: 0 10*3/uL (ref 0.0–0.5)
Eosinophils Relative: 0 %
HCT: 34.4 % — ABNORMAL LOW (ref 39.0–52.0)
Hemoglobin: 11.1 g/dL — ABNORMAL LOW (ref 13.0–17.0)
Immature Granulocytes: 9 %
Lymphocytes Relative: 7 %
Lymphs Abs: 1.8 10*3/uL (ref 0.7–4.0)
MCH: 25.9 pg — ABNORMAL LOW (ref 26.0–34.0)
MCHC: 32.3 g/dL (ref 30.0–36.0)
MCV: 80.4 fL (ref 80.0–100.0)
Monocytes Absolute: 1.5 10*3/uL — ABNORMAL HIGH (ref 0.1–1.0)
Monocytes Relative: 5 %
Neutro Abs: 22 10*3/uL — ABNORMAL HIGH (ref 1.7–7.7)
Neutrophils Relative %: 78 %
Platelets: 549 10*3/uL — ABNORMAL HIGH (ref 150–400)
RBC: 4.28 MIL/uL (ref 4.22–5.81)
RDW: 17 % — ABNORMAL HIGH (ref 11.5–15.5)
Smear Review: NORMAL
WBC: 27.9 10*3/uL — ABNORMAL HIGH (ref 4.0–10.5)
nRBC: 0 % (ref 0.0–0.2)

## 2023-01-24 LAB — LEGIONELLA PNEUMOPHILA TOTAL AB: Legionella Pneumo Total Ab: NONREACTIVE

## 2023-01-24 LAB — CULTURE, BLOOD (ROUTINE X 2)
Culture: NO GROWTH
Culture: NO GROWTH
Special Requests: ADEQUATE
Special Requests: ADEQUATE

## 2023-01-24 LAB — GLUCOSE, CAPILLARY: Glucose-Capillary: 128 mg/dL — ABNORMAL HIGH (ref 70–99)

## 2023-01-24 LAB — C-REACTIVE PROTEIN: CRP: 19.4 mg/dL — ABNORMAL HIGH (ref <1.0)

## 2023-01-24 MED ORDER — DEXAMETHASONE SODIUM PHOSPHATE 10 MG/ML IJ SOLN
6.0000 mg | INTRAMUSCULAR | Status: DC
  Administered 2023-01-25: 6 mg via INTRAVENOUS
  Filled 2023-01-24: qty 0.6

## 2023-01-24 NOTE — Progress Notes (Signed)
Inpatient Rehab Admissions Coordinator:   Notified by pt that he prefers d/c home at this time, once medically cleared.  Yesterday he was able to ambulate with CGA.  States pain is well controlled.  Medical team and [facility] aware and are working on arrangements for d/c home.  I will sign off.   Estill Dooms, PT, DPT Admissions Coordinator [phone] 01/24/23  2:43 PM

## 2023-01-24 NOTE — Plan of Care (Signed)
  Problem: Education: Goal: Knowledge of General Education information will improve Description: Including pain rating scale, medication(s)/side effects and non-pharmacologic comfort measures Outcome: Progressing   Problem: Health Behavior/Discharge Planning: Goal: Ability to manage health-related needs will improve Outcome: Progressing   Problem: Clinical Measurements: Goal: Ability to maintain clinical measurements within normal limits will improve Outcome: Progressing Goal: Cardiovascular complication will be avoided Outcome: Progressing   Problem: Activity: Goal: Risk for activity intolerance will decrease Outcome: Progressing   Problem: Coping: Goal: Level of anxiety will decrease Outcome: Progressing   Problem: Pain Management: Goal: General experience of comfort will improve Outcome: Progressing

## 2023-01-24 NOTE — Progress Notes (Signed)
Notified MD patient's wife is in room and would like to speak to her.

## 2023-01-24 NOTE — Progress Notes (Signed)
Physical Therapy Treatment Patient Details Name: Joseph [name] MRN: [id number] DOB: 1980-08-16 Today's Date: 01/24/2023   History of Present Illness Pt is a 42 y.o. male presenting to hospital 01/17/23 with c/o SOB and worsening LBP.  Imaging initially concerning for deep venous thrombosis of the left iliac vein.  Pt admitted with L4-S1 discitis with MSSA bacteremia, LBP, L AS labral tear, spinal stenosis, COPD/asthma exacerbation, and leukocytosis.  PMH includes h/o chronic hypoxia requiring supplemental O2 at home, COPD, chronic L hip pain secondary to labral tear, R olecranon bursectomy.    PT Comments  Patient received in bed. He reports he is doing well and is able to get up without help now. Patient frustrated about being here and some things, but pleasant and cooperative. He is independent with bed mobility, transfers and with ambulation in room. Patient does not require continued skilled PT at this time. He may benefit from follow up once discharged to improve strength and endurance.      If plan is discharge home, recommend the following: A little help with walking and/or transfers;A little help with bathing/dressing/bathroom;Assist for transportation;Help with stairs or ramp for entrance   Can travel by private vehicle      yes  Equipment Recommendations  None recommended by PT    Recommendations for Other Services       Precautions / Restrictions Precautions Precautions: Fall Precaution Comments: watch HR- 110s today with activity Restrictions Weight Bearing Restrictions: No     Mobility  Bed Mobility Overal bed mobility: Independent Bed Mobility: Supine to Sit, Rolling, Sidelying to Sit Rolling: Independent Sidelying to sit: Independent Supine to sit: Independent          Transfers Overall transfer level: Independent Equipment used: None Transfers: Sit to/from Stand Sit to Stand: Independent           General transfer comment: no assist needed today for  bed mobility or sit to stand    Ambulation/Gait Ambulation/Gait assistance: Supervision Gait Distance (Feet): 50 Feet Assistive device: None         General Gait Details: patient ambulating in room without AD, supervision. No assist needed. HR WNL   Stairs             Wheelchair Mobility     Tilt Bed    Modified Rankin (Stroke Patients Only)       Balance Overall balance assessment: Independent Sitting-balance support: Feet supported Sitting balance-Leahy Scale: Normal     Standing balance support: No upper extremity supported Standing balance-Leahy Scale: Normal                              Cognition Arousal: Alert Behavior During Therapy: WFL for tasks assessed/performed Overall Cognitive Status: Within Functional Limits for tasks assessed                                          Exercises      General Comments        Pertinent Vitals/Pain Pain Assessment Pain Assessment: Faces Faces Pain Scale: Hurts a little bit Pain Location: Back Pain Descriptors / Indicators: Discomfort Pain Intervention(s): Monitored during session    Home Living                          Prior Function  PT Goals (current goals can now be found in the care plan section) Acute Rehab PT Goals Patient Stated Goal: go home tomorrow PT Goal Formulation: With patient Time For Goal Achievement: 02/02/23 Potential to Achieve Goals: Good Progress towards PT goals: Goals met/education completed, patient discharged from PT    Frequency           PT Plan      Co-evaluation              AM-PAC PT "6 Clicks" Mobility   Outcome Measure  Help needed turning from your back to your side while in a flat bed without using bedrails?: None Help needed moving from lying on your back to sitting on the side of a flat bed without using bedrails?: None Help needed moving to and from a bed to a chair (including a  wheelchair)?: None Help needed standing up from a chair using your arms (e.g., wheelchair or bedside chair)?: None Help needed to walk in hospital room?: A Little Help needed climbing 3-5 steps with a railing? : A Little 6 Click Score: 22    End of Session Equipment Utilized During Treatment: Oxygen Activity Tolerance: Patient tolerated treatment well Patient left: with call bell/phone within reach;Other (comment) (seated on side of bed) Nurse Communication: Mobility status PT Visit Diagnosis: Pain;Muscle weakness (generalized) (M62.81) Pain - part of body:  (back)     Time: 1440-1454 PT Time Calculation (min) (ACUTE ONLY): 14 min  Charges:    $Gait Training: 8-22 mins PT General Charges $$ ACUTE PT VISIT: 1 Visit                     [employer], PT, [employer] 01/24/23,3:03 PM

## 2023-01-24 NOTE — Consult Note (Signed)
PULMONOLOGY         Date: 01/24/2023,   MRN# [id number] Joseph [name] Apr 30, 1980     AdmissionWeight: 81.2 kg                 CurrentWeight: 81.2 kg  Referring provider: Dr Ophelia Charter   CHIEF COMPLAINT:   Right upper lobe lung mass    HISTORY OF PRESENT ILLNESS   This is a 42 year old male with a history of atopic allergic moderate persistent asthma with atopic dermatitis and eczema, essential hypertension, COPD currently on Dupixent who came in with worsening respiratory status  and back pain. He was here in 08/2021 with AECOPD and  episodes of hypertension tachycardia had CT chest done with findings of bronchitic changes bilaterally as well as a infiltrate of the right upper lobe with areas of paraseptal emphysema worse in the upper lobes bilaterally there are also multiple old rib fractures seen bilaterally.  He had leukocytosis on arrival and is being treated for community-acquired pneumonia. Initial vitals with temp 98.2, RR 19, HR 114, BP 130/90, SpO2 98% on 2 L [place].  MRI of spine recommended. Patient started on IV vancomycin and Rocephin for potential osteomyelitis. Received IV Toradol, IV morphine and Dilaudid for pain. Received IV Solu-Medrol and DuoNeb for COPD/Asthma exacerbation He is supposed to have TEE and was not able to get this done due to respiratory issues.  PCCM consultation to optimize pulmonary function prior to surgery.   01/24/23- patient is feeling better , he is almost done wheezing now.  We are reducing his steroids now. He is on home setting O2.  I reviewed his care plan with ID , we do not plan to use paxlovid.   PAST MEDICAL HISTORY   Past Medical History:  Diagnosis Date   Asthma    COPD (chronic obstructive pulmonary disease) (HCC)    Eczema    Hypertension      SURGICAL HISTORY   Past Surgical History:  Procedure Laterality Date   ABDOMINAL AORTOGRAM W/LOWER EXTREMITY Left 01/18/2023   Procedure: ABDOMINAL AORTOGRAM W/LOWER  EXTREMITY;  Surgeon: Renford Dills, MD;  Location: [facility];  Service: Cardiovascular;  Laterality: Left;   OLECRANON BURSECTOMY Right 03/16/2022   Procedure: I&D OLECRANON (ELBOW) BURSA;  Surgeon: Signa Kell, MD;  Location: [facility];  Service: Orthopedics;  Laterality: Right;     FAMILY HISTORY   Family History  Problem Relation Age of Onset   Hypertension Mother      SOCIAL HISTORY   Social History   Tobacco Use   Smoking status: Every Day    Current packs/day: 0.50    Types: Cigarettes   Smokeless tobacco: Never  Substance Use Topics   Alcohol use: No   Drug use: Yes    Types: Marijuana     MEDICATIONS    Home Medication:    Current Medication:  Current Facility-Administered Medications:    acetaminophen (TYLENOL) tablet 650 mg, 650 mg, Oral, Q6H PRN, 650 mg at 01/18/23 1640 **OR** acetaminophen (TYLENOL) suppository 650 mg, 650 mg, Rectal, Q6H PRN, [name], MD   albuterol (PROVENTIL) (2.5 MG/3ML) 0.083% nebulizer solution 2.5 mg, 2.5 mg, Nebulization, Q2H PRN, [name], MD, 2.5 mg at 01/23/23 2004   alum & mag hydroxide-simeth (MAALOX/MYLANTA) 200-200-20 MG/5ML suspension 30 mL, 30 mL, Oral, Q4H PRN, [name], MD, 30 mL at 01/21/23 1409   ceFAZolin (ANCEF) IVPB 2g/100 mL premix, 2 g, Intravenous, Q8H, [name], MD, Last Rate: 200  mL/hr at 01/24/23 0833, 2 g at 01/24/23 3474   celecoxib (CELEBREX) capsule 200 mg, 200 mg, Oral, BID, [name], MD, 200 mg at 01/24/23 0920   Chlorhexidine Gluconate Cloth 2 % PADS 6 each, 6 each, Topical, Daily, [name], MD, 6 each at 01/24/23 2595   dexamethasone (DECADRON) injection 8 mg, 8 mg, Intravenous, Q12H, Vida Rigger, MD, 8 mg at 01/24/23 0920   DULoxetine (CYMBALTA) DR capsule 20 mg, 20 mg, Oral, Daily, [name], MD, 20 mg at 01/24/23 0920   enoxaparin (LOVENOX) injection 40 mg, 40 mg, Subcutaneous, QHS, [name], MD, 40 mg at 01/23/23 2126    feeding supplement (ENSURE ENLIVE / ENSURE PLUS) liquid 237 mL, 237 mL, Oral, BID BM, [name], MD   gabapentin (NEURONTIN) capsule 300 mg, 300 mg, Oral, TID, Georgiann Cocker, FNP, 300 mg at 01/24/23 0920   hydrochlorothiazide (HYDRODIURIL) tablet 25 mg, 25 mg, Oral, Daily, [name], MD, 25 mg at 01/24/23 6387   HYDROmorphone (DILAUDID) tablet 2 mg, 2 mg, Oral, TID, Georgiann Cocker, FNP, 2 mg at 01/24/23 0921   lidocaine (LIDODERM) 5 % 1 patch, 1 patch, Transdermal, Q24H, Georgiann Cocker, FNP, 1 patch at 01/24/23 1353   losartan (COZAAR) tablet 100 mg, 100 mg, Oral, Daily, [name], MD, 100 mg at 01/24/23 0921   montelukast (SINGULAIR) tablet 10 mg, 10 mg, Oral, QHS, [name], MD, 10 mg at 01/23/23 2123   multivitamin with minerals tablet 1 tablet, 1 tablet, Oral, Daily, [name], MD, 1 tablet at 01/24/23 0921   ondansetron (ZOFRAN) tablet 4 mg, 4 mg, Oral, Q6H PRN **OR** ondansetron (ZOFRAN) injection 4 mg, 4 mg, Intravenous, Q6H PRN, [name], MD   oxyCODONE (Oxy IR/ROXICODONE) immediate release tablet 5-10 mg, 5-10 mg, Oral, Q4H PRN, [name], MD, 10 mg at 01/23/23 0540   pantoprazole (PROTONIX) EC tablet 40 mg, 40 mg, Oral, Daily, [name], MD, 40 mg at 01/24/23 5643   QUEtiapine (SEROQUEL) tablet 25 mg, 25 mg, Oral, QHS, Georgiann Cocker, FNP, 25 mg at 01/23/23 2125   senna-docusate (Senokot-S) tablet 1 tablet, 1 tablet, Oral, QHS PRN, [name], MD   sodium chloride flush (NS) 0.9 % injection 10-40 mL, 10-40 mL, Intracatheter, Q12H, [name], MD, 10 mL at 01/24/23 3295   sodium chloride flush (NS) 0.9 % injection 10-40 mL, 10-40 mL, Intracatheter, PRN, [name], MD   tiZANidine (ZANAFLEX) tablet 2 mg, 2 mg, Oral, TID, [name], MD, 2 mg at 01/24/23 0920   umeclidinium bromide (INCRUSE ELLIPTA) 62.5 MCG/ACT 1 puff, 1 puff, Inhalation, Daily, [name], MD, 1 puff at 01/24/23  1884    ALLERGIES   Penicillins, Pork-derived products, Tomato, Banana, and Coconut (cocos nucifera)     REVIEW OF SYSTEMS    Review of Systems:  Gen:  Denies  fever, sweats, chills weigh loss  HEENT: Denies blurred vision, double vision, ear pain, eye pain, hearing loss, nose bleeds, sore throat Cardiac:  No dizziness, chest pain or heaviness, chest tightness,edema Resp:   reports dyspnea chronically  Gi: Denies swallowing difficulty, stomach pain, nausea or vomiting, diarrhea, constipation, bowel incontinence Gu:  Denies bladder incontinence, burning urine Ext:   Denies Joint pain, stiffness or swelling Skin: Denies  skin rash, easy bruising or bleeding or hives Endoc:  Denies polyuria, polydipsia , polyphagia or weight change Psych:   Denies depression, insomnia or hallucinations   Other:  All other systems negative   VS: BP  126/86 (BP Location: Left Arm)   Pulse (!) 101   Temp 98.2 F (36.8 C) (Oral)   Resp 18   Ht 5' (1.524 m)   Wt 81.2 kg   SpO2 100%   BMI 34.96 kg/m      PHYSICAL EXAM    GENERAL:NAD, no fevers, chills, no weakness no fatigue HEAD: Normocephalic, atraumatic.  EYES: Pupils equal, round, reactive to light. Extraocular muscles intact. No scleral icterus.  MOUTH: Moist mucosal membrane. Dentition intact. No abscess noted.  EAR, NOSE, THROAT: Clear without exudates. No external lesions.  NECK: Supple. No thyromegaly. No nodules. No JVD.  PULMONARY: decreased breath sounds with mild rhonchi worse at bases bilaterally.  +wheezing CARDIOVASCULAR: S1 and S2. Regular rate and rhythm. No murmurs, rubs, or gallops. No edema. Pedal pulses 2+ bilaterally.  GASTROINTESTINAL: Soft, nontender, nondistended. No masses. Positive bowel sounds. No hepatosplenomegaly.  MUSCULOSKELETAL: No swelling, clubbing, or edema. Range of motion full in all extremities.  NEUROLOGIC: Cranial nerves II through XII are intact. No gross focal neurological deficits. Sensation  intact. Reflexes intact.  SKIN: No ulceration, lesions, rashes, or cyanosis. Skin warm and dry. Turgor intact.  PSYCHIATRIC: Mood, affect within normal limits. The patient is awake, alert and oriented x 3. Insight, judgment intact.       IMAGING      Narrative & Impression  CLINICAL DATA:  Shortness of breath.   EXAM: PORTABLE CHEST 1 VIEW   COMPARISON:  August 29, 2021.  October 13, 2022.   FINDINGS: The heart size and mediastinal contours are within normal limits. Both lungs are clear. Old bilateral rib fractures are noted.   IMPRESSION: No active disease.     Electronically Signed   By: Lupita Raider M.D.   On: 01/17/2023 09:57    ASSESSMENT/PLAN   Acute on chronic hypoxemic respiratory failure - present on admission  - COVID19 negative 08/21/21 - new swab is + for covid19 - supplemental O2 during my evaluation 3L/min - will perform infectious workup for pneumonia -legionella ab -strep pneumoniae ur AG -Histoplasma Ur Ag -sputum resp cultures -reviewed pertinent imaging with patient today - CRP trending -Decadron 8 mg BID>>>6mg  daily IV with tapering -noted antibiotics for MSSA bacteramia -PT/OT for d/c planning  -please encourage patient to use incentive spirometer few times each hour while hospitalized.       Right lung masslike infiltrate   Continue pneumonia therapy , will re-image post full scope of treatment         Thank you for allowing me to participate in the care of this patient.   Patient/Family are satisfied with care plan and all questions have been answered.    Provider disclosure: Patient with at least one acute or chronic illness or injury that poses a threat to life or bodily function and is being managed actively during this encounter.  All of the below services have been performed independently by signing provider:  review of prior documentation from internal and or external health records.  Review of previous and current lab  results.  Interview and comprehensive assessment during patient visit today. Review of current and previous chest radiographs/CT scans. Discussion of management and test interpretation with health care team and patient/family.   This document was prepared using Dragon voice recognition software and may include unintentional dictation errors.     Vida Rigger, M.D.  Division of Pulmonary & Critical Care Medicine

## 2023-01-24 NOTE — Plan of Care (Signed)
  Problem: Education: Goal: Knowledge of General Education information will improve Description: Including pain rating scale, medication(s)/side effects and non-pharmacologic comfort measures Outcome: Progressing   Problem: Health Behavior/Discharge Planning: Goal: Ability to manage health-related needs will improve Outcome: Progressing   Problem: Clinical Measurements: Goal: Ability to maintain clinical measurements within normal limits will improve Outcome: Progressing Goal: Will remain free from infection Outcome: Progressing Goal: Diagnostic test results will improve Outcome: Progressing Goal: Respiratory complications will improve Outcome: Progressing Goal: Cardiovascular complication will be avoided Outcome: Progressing   Problem: Activity: Goal: Risk for activity intolerance will decrease Outcome: Progressing   Problem: Nutrition: Goal: Adequate nutrition will be maintained Outcome: Progressing   Problem: Coping: Goal: Level of anxiety will decrease Outcome: Progressing   Problem: Elimination: Goal: Will not experience complications related to bowel motility Outcome: Progressing Goal: Will not experience complications related to urinary retention Outcome: Progressing   Problem: Pain Management: Goal: General experience of comfort will improve Outcome: Progressing   Problem: Safety: Goal: Ability to remain free from injury will improve Outcome: Progressing   Problem: Skin Integrity: Goal: Risk for impaired skin integrity will decrease Outcome: Progressing   Problem: Education: Goal: Ability to describe self-care measures that may prevent or decrease complications (Diabetes Survival Skills Education) will improve Outcome: Progressing Goal: Individualized Educational Video(s) Outcome: Progressing   Problem: Coping: Goal: Ability to adjust to condition or change in health will improve Outcome: Progressing   Problem: Fluid Volume: Goal: Ability to  maintain a balanced intake and output will improve Outcome: Progressing   Problem: Health Behavior/Discharge Planning: Goal: Ability to identify and utilize available resources and services will improve Outcome: Progressing Goal: Ability to manage health-related needs will improve Outcome: Progressing   Problem: Metabolic: Goal: Ability to maintain appropriate glucose levels will improve Outcome: Progressing   Problem: Nutritional: Goal: Maintenance of adequate nutrition will improve Outcome: Progressing Goal: Progress toward achieving an optimal weight will improve Outcome: Progressing   Problem: Skin Integrity: Goal: Risk for impaired skin integrity will decrease Outcome: Progressing   Problem: Tissue Perfusion: Goal: Adequacy of tissue perfusion will improve Outcome: Progressing   Problem: Education: Goal: Knowledge of risk factors and measures for prevention of condition will improve Outcome: Progressing   Problem: Coping: Goal: Psychosocial and spiritual needs will be supported Outcome: Progressing   Problem: Respiratory: Goal: Will maintain a patent airway Outcome: Progressing Goal: Complications related to the disease process, condition or treatment will be avoided or minimized Outcome: Progressing

## 2023-01-24 NOTE — Progress Notes (Signed)
PROGRESS NOTE    Joseph [name]   BJY:[id number] DOB: 1980/12/03  DOA: 01/17/2023 Date of Service: 01/24/23 which is hospital day 7  PCP: Wilford Corner, [name]    HPI: [phone] with h/o asthma/COPD on chronic steroids and 2L home O2, DM, and HTN who presented on 12/4 with worsening LBP and SOB.   Hospital course / significant events:  Imaging concerning for left iliac DVT.  Started on Heparin.  Underwent venogram on 12/5, no evidence of thrombus, vascular surgery signed off, Heparin discontinued.   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 Blood cultures growing MSSA, started on Cefazolin. ID is consulting, agreed with concern for discitis as the source but MRI is negative for discitis or epidural abscess so source is uncertain, will need hip MRI.  Neurosurgery consulted, nonoperative management, pain control (will ask palliative care to help).  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 Negative scrotal US. Unclear source bacteremia - TEE delayed d/t (+)COVID, will need 4-6 weeks abx total, PICC is in place   Consultants:  Vascular Surgery Infectious Disease Neurosurgery Palliative Care  Pulmonology   Procedures/Surgeries: LLE venogram 12/5  PICC placement 12/8  Antibiotics: Ceftriaxone x 1 Vancomycin x 1 Cefazolin 12/5-now    ASSESSMENT & PLAN:   MSSA Bacteremia Patient with h/o MRSA septic bursitis presenting with back pain. CT showed L4-S1 osteo/discitis but MRI (without contrast) did not show this definitively and MRI with contrast ruled this out as source neg TB Ruled out HIV Neurosurgery consulted and thinks intervention would not likely be needed Will need antibiotics for 4-6 weeks. PICC line placed - currently on Cefazolin With bacteremia, ID consulted  Echo unremarkable, plan for TEE with [facility] Thursday, 12/12 - which may need to be canceled due to COVID infection. If TEE cannot be done due to  respiratory status, he will need a minimum of 4 weeks of IV Cefazolin per ID   Abnormal imaging  There was concern for L iliac DVT, although venogram was negative so heparin stopped and vascular signed off  Lung nodule Pulmonary consulted Reimage post treatment  Legionella, strep pneumo, histoplasma, sputum respiratory cultures  Continue abx  Decadron 8 mg bid  Trend CRP   COPD/asthma exacerbation with COVID-19 infection Tested positive for COVID-19 infection on 12/10 Patient with persistent asthma and COPD on chronic steroids and chronic 2 L Home at home Last saw pulm Dr. Meredeth Ide in 09/2022 Continue supplemental oxygen Continue steroid burst (also on chronic steroids at home) Resume home Incruse Ellipta and Singulair Continue bronchodilators On Dupixent every 2 weeks - hold next dose as this can exacerbate wheezing Added IS and flutter valve Fortunately, he currently has mild nasal congestion and persistent wheezing but does not appear to be seriously ill   Acute on chronic low back pain / groin pain attributed to: Moderate Spinal stenosis w/ L3-4 and L4-5 disc bulging  L femoral head osteonecrosis and tendinosis L hip labral teat  MRI without evidence of discitis or epidural abscess, no other obvious source bacteremia, Scrotal US negative Neurosurgery consulted --> no intervention  Palliative care is assisting with pain control He has chronic ambulatory dysfunction and now acute worsening; he may benefit from CIR placement pending progress Added Celebrex for further pain control Pain control with PO Dilaudid TID + Oxy prn breakthrough   DM New onset, A1c 6.8 May be related to steroid-induced hyperglycemia added moderate-scale SSI (patient is declining - will d/c orders)  HTN Resume home HCTZ and losartan   GERD Protonix 40 mg daily (in lieu of home omeprazole)   Depression Patient and wife report long-standing depression He has rage issues and is reluctant to try  SSRI Given his chronic pain, he is willing to trial Duloxetine   Class 1 Obesity Body mass index is 34.96 kg/m.Marland Kitchen  Weight loss should be encouraged Outpatient PCP/bariatric medicine f/u encouraged   Nutrition Problem: Inadequate oral intake Etiology: poor appetite Signs/Symptoms: per patient/family report Interventions: Ensure Enlive (each supplement provides 350kcal and 20 grams of protein), MVI, Liberalize Diet        obese based on BMI: Body mass index is 34.96 kg/m.  Underweight - under 18.5  normal weight - 18.5 to 24.9 overweight - 25 to 29.9 obese - 30 or more   DVT prophylaxis: lovenox IV fluids: no continuous IV fluids  Nutrition: regular  Central lines / invasive devices: PICC line   Code Status: FULL CODE ACP documentation reviewed:  none on file in [facility]  TOC needs: home health, pt has declined CIR  Barriers to dispo / significant pending items: outpatient abx             Subjective / Brief ROS:  Patient reports feeling okay today, a bit tired/congested in sinuses Denies CP/SOB.  Pain controlled.  Denies new weakness.  Tolerating diet.  Reports no concerns w/ urination/defecation.   Family Communication: none at this time     Objective Findings:  Vitals:   01/23/23 1451 01/23/23 2332 01/24/23 0513 01/24/23 0805  BP: (!) 122/93 127/82  132/84  Pulse: (!) 133 99 (!) 105 (!) 101  Resp: 17 20 18 18   Temp: 98.2 F (36.8 C) 98.2 F (36.8 C) 98.1 F (36.7 C) (!) 97.3 F (36.3 C)  TempSrc:  Oral Oral Oral  SpO2: 91% 99% 94% 96%  Weight:      Height:        Intake/Output Summary (Last 24 hours) at 01/24/2023 1451 Last data filed at 01/24/2023 0149 Gross per 24 hour  Intake 600 ml  Output 600 ml  Net 0 ml   Filed Weights   01/17/23 0912 01/18/23 0947  Weight: 81.2 kg 81.2 kg    Examination:  Physical Exam Constitutional:      General: He is not in acute distress. Cardiovascular:     Rate and Rhythm: Normal rate and  regular rhythm.     Heart sounds: Normal heart sounds.  Pulmonary:     Effort: Pulmonary effort is normal.     Breath sounds: Normal breath sounds.  Musculoskeletal:     Right lower leg: No edema.     Left lower leg: No edema.  Skin:    General: Skin is warm and dry.  Neurological:     General: No focal deficit present.     Mental Status: He is alert and oriented to person, place, and time.          Scheduled Medications:   celecoxib  200 mg Oral BID   Chlorhexidine Gluconate Cloth  6 each Topical Daily   dexamethasone (DECADRON) injection  8 mg Intravenous Q12H   DULoxetine  20 mg Oral Daily   enoxaparin (LOVENOX) injection  40 mg Subcutaneous QHS   feeding supplement  237 mL Oral BID BM   gabapentin  300 mg Oral TID   hydrochlorothiazide  25 mg Oral Daily   HYDROmorphone  2 mg Oral TID   lidocaine  1 patch Transdermal Q24H  losartan  100 mg Oral Daily   montelukast  10 mg Oral QHS   multivitamin with minerals  1 tablet Oral Daily   pantoprazole  40 mg Oral Daily   QUEtiapine  25 mg Oral QHS   sodium chloride flush  10-40 mL Intracatheter Q12H   tiZANidine  2 mg Oral TID   umeclidinium bromide  1 puff Inhalation Daily    Continuous Infusions:   ceFAZolin (ANCEF) IV 2 g (01/24/23 0833)    PRN Medications:  acetaminophen **OR** acetaminophen, albuterol, alum & mag hydroxide-simeth, ondansetron **OR** ondansetron (ZOFRAN) IV, oxyCODONE, senna-docusate, sodium chloride flush  Antimicrobials from admission:  Anti-infectives (From admission, onward)    Start     Dose/Rate Route Frequency Ordered Stop   01/18/23 0917  vancomycin (VANCOCIN) IVPB 1000 mg/200 mL premix        1,000 mg 200 mL/hr over 60 Minutes Intravenous 60 min pre-op 01/18/23 0917 01/18/23 1428   01/18/23 0900  ceFAZolin (ANCEF) IVPB 2g/100 mL premix        2 g 200 mL/hr over 30 Minutes Intravenous Every 8 hours 01/18/23 0721     01/17/23 1300  vancomycin (VANCOREADY) IVPB 1750 mg/350 mL         1,750 mg 175 mL/hr over 120 Minutes Intravenous  Once 01/17/23 1248 01/17/23 1715   01/17/23 1245  cefTRIAXone (ROCEPHIN) 2 g in sodium chloride 0.9 % 100 mL IVPB        2 g 200 mL/hr over 30 Minutes Intravenous  Once 01/17/23 1242 01/17/23 1432           Data Reviewed:  I have personally reviewed the following...  CBC: Recent Labs  Lab 01/20/23 0318 01/21/23 0159 01/22/23 0219 01/23/23 0539 01/24/23 0950  WBC 18.0* 21.9* 22.0* 24.4* 27.9*  NEUTROABS 14.6* 16.3* 15.3* 15.9* 22.0*  HGB 13.1 12.0* 11.8* 12.0* 11.1*  HCT 41.5 38.1* 36.7* 37.8* 34.4*  MCV 83.7 83.4 82.8 82.7 80.4  PLT 371 346 397 462* 549*   Basic Metabolic Panel: Recent Labs  Lab 01/18/23 0505 01/20/23 0318 01/22/23 0219 01/23/23 0539 01/24/23 0950  NA 135 136 137 135 135  K 3.7 3.9 3.6 3.7 4.2  CL 99 97* 96* 95* 95*  CO2 28 31 31  32 31  GLUCOSE 121* 98 104* 105* 134*  BUN 24* 18 31* 22* 31*  CREATININE 0.82 0.78 0.77 0.74 0.94  CALCIUM 8.0* 8.5* 8.8* 9.1 8.8*   GFR: Estimated Creatinine Clearance: 88.7 mL/min (by C-G formula based on SCr of 0.94 mg/dL). Liver Function Tests: No results for input(s): "AST", "ALT", "ALKPHOS", "BILITOT", "PROT", "ALBUMIN" in the last 168 hours. No results for input(s): "LIPASE", "AMYLASE" in the last 168 hours. No results for input(s): "AMMONIA" in the last 168 hours. Coagulation Profile: Recent Labs  Lab 01/17/23 1611  INR 1.1   Cardiac Enzymes: No results for input(s): "CKTOTAL", "CKMB", "CKMBINDEX", "TROPONINI" in the last 168 hours. BNP (last 3 results) No results for input(s): "PROBNP" in the last 8760 hours. HbA1C: No results for input(s): "HGBA1C" in the last 72 hours. CBG: Recent Labs  Lab 01/23/23 0755 01/23/23 1148 01/23/23 1628 01/23/23 2133 01/24/23 0855  GLUCAP 142* 121* 159* 142* 128*   Lipid Profile: No results for input(s): "CHOL", "HDL", "LDLCALC", "TRIG", "CHOLHDL", "LDLDIRECT" in the last 72 hours. Thyroid Function Tests: No  results for input(s): "TSH", "T4TOTAL", "FREET4", "T3FREE", "THYROIDAB" in the last 72 hours. Anemia Panel: No results for input(s): "VITAMINB12", "FOLATE", "FERRITIN", "TIBC", "IRON", "RETICCTPCT" in the last  72 hours. Most Recent Urinalysis On File:     Component Value Date/Time   COLORURINE YELLOW (A) 12/05/2017 0823   APPEARANCEUR CLEAR (A) 12/05/2017 0823   LABSPEC 1.016 12/05/2017 0823   PHURINE 7.0 12/05/2017 0823   GLUCOSEU NEGATIVE 12/05/2017 0823   HGBUR NEGATIVE 12/05/2017 0823   BILIRUBINUR NEGATIVE 12/05/2017 0823   KETONESUR NEGATIVE 12/05/2017 0823   PROTEINUR NEGATIVE 12/05/2017 0823   NITRITE NEGATIVE 12/05/2017 0823   LEUKOCYTESUR NEGATIVE 12/05/2017 0823   Sepsis Labs: @LABRCNTIP (procalcitonin:4,lacticidven:4) Microbiology: Recent Results (from the past 240 hour(s))  Blood Culture (routine x 2)     Status: Abnormal   Collection Time: 01/17/23  1:41 PM   Specimen: BLOOD  Result Value Ref Range Status   Specimen Description   Final    BLOOD BLOOD LEFT HAND Performed at [facility], [address], [place], [place] [address]    Special Requests   Final    BOTTLES DRAWN AEROBIC AND ANAEROBIC Blood Culture adequate volume Performed at [facility], [address] [place], [place] [address]    Culture  Setup Time   Final    GRAM POSITIVE COCCI IN BOTH AEROBIC AND ANAEROBIC BOTTLES CRITICAL RESULT CALLED TO, READ BACK BY AND VERIFIED WITH:  Gloriann Loan AT 0981 01/18/23 JG Performed at [facility], [address] [place], [place] [address]    Culture (A)  Final    STAPHYLOCOCCUS AUREUS SUSCEPTIBILITIES PERFORMED ON PREVIOUS CULTURE WITHIN THE LAST 5 DAYS. Performed at [facility], [address], [place], [place] [address]    Report Status 01/20/2023 FINAL  Final  Blood Culture (routine x 2)     Status: Abnormal   Collection Time: 01/17/23  1:41 PM   Specimen: BLOOD  Result Value Ref Range Status   Specimen  Description   Final    BLOOD BLOOD LEFT FOREARM Performed at [facility], [address] [place], [place] [address]    Special Requests   Final    BOTTLES DRAWN AEROBIC AND ANAEROBIC Blood Culture adequate volume Performed at [facility], [address] [place], [place] [address]    Culture  Setup Time   Final    GRAM POSITIVE COCCI IN BOTH AEROBIC AND ANAEROBIC BOTTLES CRITICAL RESULT CALLED TO, READ BACK BY AND VERIFIED WITH:  Gloriann Loan AT 6962 01/18/23 JG Performed at [facility], [address], [place], [place] [address]    Culture STAPHYLOCOCCUS AUREUS (A)  Final   Report Status 01/20/2023 FINAL  Final   Organism ID, Bacteria STAPHYLOCOCCUS AUREUS  Final      Susceptibility   Staphylococcus aureus - MIC*    CIPROFLOXACIN <=0.5 SENSITIVE Sensitive     ERYTHROMYCIN >=8 RESISTANT Resistant     GENTAMICIN <=0.5 SENSITIVE Sensitive     OXACILLIN <=0.25 SENSITIVE Sensitive     TETRACYCLINE <=1 SENSITIVE Sensitive     VANCOMYCIN 1 SENSITIVE Sensitive     TRIMETH/SULFA <=10 SENSITIVE Sensitive     CLINDAMYCIN RESISTANT Resistant     RIFAMPIN <=0.5 SENSITIVE Sensitive     Inducible Clindamycin POSITIVE Resistant     LINEZOLID 2 SENSITIVE Sensitive     * STAPHYLOCOCCUS AUREUS  Blood Culture ID Panel (Reflexed)     Status: Abnormal   Collection Time: 01/17/23  1:41 PM  Result Value Ref Range Status   Enterococcus faecalis NOT DETECTED NOT DETECTED Final   Enterococcus Faecium NOT DETECTED NOT DETECTED Final   Listeria monocytogenes NOT DETECTED  NOT DETECTED Final   Staphylococcus species DETECTED (A) NOT DETECTED Final    Comment: CRITICAL RESULT CALLED TO, READ BACK BY AND VERIFIED WITH:  TREY GREENWOOD AT 4782 01/18/23 JG    Staphylococcus aureus (BCID) DETECTED (A) NOT DETECTED Final    Comment: CRITICAL RESULT CALLED TO, READ BACK BY AND VERIFIED WITH:  Gloriann Loan AT 0705 01/18/23 JG    Staphylococcus epidermidis NOT DETECTED NOT  DETECTED Final   Staphylococcus lugdunensis NOT DETECTED NOT DETECTED Final   Streptococcus species NOT DETECTED NOT DETECTED Final   Streptococcus agalactiae NOT DETECTED NOT DETECTED Final   Streptococcus pneumoniae NOT DETECTED NOT DETECTED Final   Streptococcus pyogenes NOT DETECTED NOT DETECTED Final   A.calcoaceticus-baumannii NOT DETECTED NOT DETECTED Final   Bacteroides fragilis NOT DETECTED NOT DETECTED Final   Enterobacterales NOT DETECTED NOT DETECTED Final   Enterobacter cloacae complex NOT DETECTED NOT DETECTED Final   Escherichia coli NOT DETECTED NOT DETECTED Final   Klebsiella aerogenes NOT DETECTED NOT DETECTED Final   Klebsiella oxytoca NOT DETECTED NOT DETECTED Final   Klebsiella pneumoniae NOT DETECTED NOT DETECTED Final   Proteus species NOT DETECTED NOT DETECTED Final   Salmonella species NOT DETECTED NOT DETECTED Final   Serratia marcescens NOT DETECTED NOT DETECTED Final   Haemophilus influenzae NOT DETECTED NOT DETECTED Final   Neisseria meningitidis NOT DETECTED NOT DETECTED Final   Pseudomonas aeruginosa NOT DETECTED NOT DETECTED Final   Stenotrophomonas maltophilia NOT DETECTED NOT DETECTED Final   Candida albicans NOT DETECTED NOT DETECTED Final   Candida auris NOT DETECTED NOT DETECTED Final   Candida glabrata NOT DETECTED NOT DETECTED Final   Candida krusei NOT DETECTED NOT DETECTED Final   Candida parapsilosis NOT DETECTED NOT DETECTED Final   Candida tropicalis NOT DETECTED NOT DETECTED Final   Cryptococcus neoformans/gattii NOT DETECTED NOT DETECTED Final   Meth resistant mecA/C and MREJ NOT DETECTED NOT DETECTED Final    Comment: Performed at [facility], [address] [place], [place] [address]  Culture, blood (Routine X 2) w Reflex to ID Panel     Status: None   Collection Time: 01/19/23  5:19 AM   Specimen: BLOOD  Result Value Ref Range Status   Specimen Description BLOOD BLOOD LEFT ARM  Final   Special Requests   Final    BOTTLES  DRAWN AEROBIC AND ANAEROBIC Blood Culture adequate volume   Culture   Final    NO GROWTH 5 DAYS Performed at [facility], [address] [place], [place] [address]    Report Status 01/24/2023 FINAL  Final  Culture, blood (Routine X 2) w Reflex to ID Panel     Status: None   Collection Time: 01/19/23  5:27 AM   Specimen: BLOOD  Result Value Ref Range Status   Specimen Description BLOOD BLOOD RIGHT HAND  Final   Special Requests   Final    BOTTLES DRAWN AEROBIC AND ANAEROBIC Blood Culture adequate volume   Culture   Final    NO GROWTH 5 DAYS Performed at [facility], [address] [place], [place] [address]    Report Status 01/24/2023 FINAL  Final  SARS Coronavirus 2 by RT PCR (hospital order, performed in [facility] hospital lab) *cepheid single result test* Anterior Nasal Swab     Status: Abnormal   Collection Time: 01/23/23  8:35 AM   Specimen: Anterior Nasal Swab  Result Value Ref Range Status   SARS Coronavirus 2 by RT PCR POSITIVE (  A) NEGATIVE Final    Comment: (NOTE) SARS-CoV-2 target nucleic acids are DETECTED  SARS-CoV-2 RNA is generally detectable in upper respiratory specimens  during the acute phase of infection.  Positive results are indicative  of the presence of the identified virus, but do not rule out bacterial infection or co-infection with other pathogens not detected by the test.  Clinical correlation with patient history and  other diagnostic information is necessary to determine patient infection status.  The expected result is negative.  Fact Sheet for Patients:   RoadLapTop.co.za   Fact Sheet for Healthcare Providers:   http://kim-miller.com/    This test is not yet approved or cleared by the [place] FDA and  has been authorized for detection and/or diagnosis of SARS-CoV-2 by FDA under an Emergency Use Authorization (EUA).  This EUA will remain in effect (meaning this test can  be used) for the duration of  the COVID-19 declaration under Section 564(b)(1)  of the Act, 21 U.S.C. section 360-bbb-3(b)(1), unless the authorization is terminated or revoked sooner.   Performed at [facility], [address] [place], [place] [address]   Respiratory (~20 pathogens) panel by PCR     Status: None   Collection Time: 01/23/23  8:35 AM   Specimen: Nasopharyngeal Swab; Respiratory  Result Value Ref Range Status   Adenovirus NOT DETECTED NOT DETECTED Final   Coronavirus 229E NOT DETECTED NOT DETECTED Final    Comment: (NOTE) The Coronavirus on the Respiratory Panel, DOES NOT test for the novel  Coronavirus (2019 nCoV)    Coronavirus HKU1 NOT DETECTED NOT DETECTED Final   Coronavirus NL63 NOT DETECTED NOT DETECTED Final   Coronavirus OC43 NOT DETECTED NOT DETECTED Final   Metapneumovirus NOT DETECTED NOT DETECTED Final   Rhinovirus / Enterovirus NOT DETECTED NOT DETECTED Final   Influenza A NOT DETECTED NOT DETECTED Final   Influenza B NOT DETECTED NOT DETECTED Final   Parainfluenza Virus 1 NOT DETECTED NOT DETECTED Final   Parainfluenza Virus 2 NOT DETECTED NOT DETECTED Final   Parainfluenza Virus 3 NOT DETECTED NOT DETECTED Final   Parainfluenza Virus 4 NOT DETECTED NOT DETECTED Final   Respiratory Syncytial Virus NOT DETECTED NOT DETECTED Final   Bordetella pertussis NOT DETECTED NOT DETECTED Final   Bordetella Parapertussis NOT DETECTED NOT DETECTED Final   Chlamydophila pneumoniae NOT DETECTED NOT DETECTED Final   Mycoplasma pneumoniae NOT DETECTED NOT DETECTED Final    Comment: Performed at [facility], [address] [place], [place] [address]  SARS Coronavirus 2 by RT PCR (hospital order, performed in [facility] hospital lab) *cepheid single result test* Anterior Nasal Swab     Status: None   Collection Time: 01/23/23  1:09 PM   Specimen: Anterior Nasal Swab  Result Value Ref Range Status   SARS Coronavirus 2 by RT PCR NEGATIVE NEGATIVE  Final    Comment: (NOTE) SARS-CoV-2 target nucleic acids are NOT DETECTED.  The SARS-CoV-2 RNA is generally detectable in upper and lower respiratory specimens during the acute phase of infection. The lowest concentration of SARS-CoV-2 viral copies this assay can detect is 250 copies / mL. A negative result does not preclude SARS-CoV-2 infection and should not be used as the sole basis for treatment or other patient management decisions.  A negative result may occur with improper specimen collection / handling, submission of specimen other than nasopharyngeal swab, presence of viral mutation(s) within the areas targeted by this assay, and inadequate number of viral copies (<250  copies / mL). A negative result must be combined with clinical observations, patient history, and epidemiological information.  Fact Sheet for Patients:   RoadLapTop.co.za  Fact Sheet for Healthcare Providers: http://kim-miller.com/  This test is not yet approved or  cleared by the [place] FDA and has been authorized for detection and/or diagnosis of SARS-CoV-2 by FDA under an Emergency Use Authorization (EUA).  This EUA will remain in effect (meaning this test can be used) for the duration of the COVID-19 declaration under Section 564(b)(1) of the Act, 21 U.S.C. section 360bbb-3(b)(1), unless the authorization is terminated or revoked sooner.  Performed at [facility], [address] [place], [place] [address]       Radiology Studies last 3 days: [place] EKG SITE RITE  Result Date: 01/21/2023 If Site Rite image not attached, placement could not be confirmed due to current cardiac rhythm.      Time spent: 50 min     Sunnie Nielsen, DO [facility] 01/24/2023, 2:51 PM    Dictation software may have been used to generate the above note. Typos may occur and escape review in typed/dictated notes. Please contact Dr Lyn Hollingshead  directly for clarity if needed.  Staff may message me via secure chat in Epic  but this may not receive an immediate response,  please page me for urgent matters!  If 7PM-7AM, please contact night coverage [email or web address]

## 2023-01-24 NOTE — Progress Notes (Signed)
Patient HR [id number], patient is up in bathroom, asymptomatic. MD notified. It was passed on in morning report that patient does get tachycardic in the 160's when up.

## 2023-01-24 NOTE — Progress Notes (Signed)
Wife requested MD call her as she did not see MD this am before she left room, MD messaged and number given. Marylu Lund [phone])

## 2023-01-24 NOTE — Treatment Plan (Signed)
Diagnosis: MSSA bacteremia Baseline Creatinine <1    Allergies  Allergen Reactions   Penicillins Shortness Of Breath   Pork-Derived Products Nausea Only   Tomato Rash   Banana Hives   Coconut (Cocos Nucifera) Hives    OPAT Orders Discharge antibiotics: Cefazolin 2 grams IV every 8 hours Duration: 4 weeks End Date: 02/15/23  [facility] Care Per Protocol:  Labs weekly while on IV antibiotics: _X_ CBC with differential  _X_ CMP _X_ CRP _X_ ESR   _X_ Please pull PIC at completion of IV antibiotics   Fax weekly lab results  promptly to [phone]  Clinic Follow Up Appt: 02/08/23 at 9.15 am   Call [phone] with any questions

## 2023-01-24 NOTE — Progress Notes (Signed)
Patient is not able to walk the distance required to go the bathroom, or he/she is unable to safely negotiate stairs required to access the bathroom.  A 3in1 BSC will alleviate this problem  

## 2023-01-24 NOTE — TOC Progression Note (Signed)
Transition of Care [facility]) - Progression Note    Patient Details  Name: Joseph [name] MRN: [id number] Date of Birth: Mar 13, 1980  Transition of Care [facility]) CM/SW Contact  Garret Reddish, RN Phone Number: 01/24/2023, 5:32 PM  Clinical Narrative:    Chart reviewed.  Noted that patient does not want to go to CIR and would like to go to home with home health.    I have spoken with patient.  He reports that he lives at home with his spouse.  He reports that she does work but she is willing to assist with home infusion.    Patient has no home health preference.  I have asked Pam with [facility] to provide home infusion medication.  Pam will plan to completed teaching in the morning.  I have also asked Kandee Keen with [facility] to accept home health referral for home infusion, PT and OT.    Patient will need 2 RW, BSC, and wheelchair prior to discharge.    TOC will continue to follow for discharge planning.   Expected Discharge Plan: Home w Home Health Services Barriers to Discharge: Continued Medical Work up  Expected Discharge Plan and Services       Living arrangements for the past 2 months: Single Family Home                 DME Arranged: Walker rolling, Wheelchair manual, Bedside commode   Date DME Agency Contacted: 01/20/23     HH Arranged: PT, OT, RN HH Agency: [facility] Date HH Agency Contacted: 01/20/23   Representative spoke with at [facility] Agency: Elnita Maxwell   Social Determinants of Health (SDOH) Interventions SDOH Screenings   Food Insecurity: No Food Insecurity (01/18/2023)  Housing: Low Risk  (01/18/2023)  Transportation Needs: No Transportation Needs (01/18/2023)  Utilities: Not At Risk (01/18/2023)  Financial Resource Strain: Medium Risk (09/22/2022)   Received from [facility]  Tobacco Use: High Risk (12/28/2022)   Received from [facility]    Readmission Risk Interventions    01/20/2023   11:21 AM 09/02/2021   12:30 PM  08/23/2021   11:05 AM  Readmission Risk Prevention Plan  Post Dischage Appt Complete  Complete  Medication Screening Complete  Complete  Transportation Screening Complete Complete Complete  PCP or Specialist Appt within 5-7 Days  Complete   Home Care Screening  Complete   Medication Review (RN CM)  Complete

## 2023-01-25 ENCOUNTER — Encounter
Admission: EM | Disposition: A | Discharge status: Home Health | Payer: Self-pay | Source: Home / Self Care | Attending: Internal Medicine

## 2023-01-25 DIAGNOSIS — B9561 Methicillin susceptible Staphylococcus aureus infection as the cause of diseases classified elsewhere: Secondary | ICD-10-CM | POA: Diagnosis not present

## 2023-01-25 DIAGNOSIS — R7881 Bacteremia: Secondary | ICD-10-CM | POA: Diagnosis not present

## 2023-01-25 LAB — SEDIMENTATION RATE: Sed Rate: 73 mm/h — ABNORMAL HIGH (ref 0–15)

## 2023-01-25 LAB — GLUCOSE, CAPILLARY: Glucose-Capillary: 107 mg/dL — ABNORMAL HIGH (ref 70–99)

## 2023-01-25 SURGERY — TRANSESOPHAGEAL ECHOCARDIOGRAM (TEE)
Anesthesia: General

## 2023-01-25 MED ORDER — QUETIAPINE FUMARATE 25 MG PO TABS: 25.0000 mg | ORAL_TABLET | Freq: Every day | ORAL | 0 refills | Status: DC

## 2023-01-25 MED ORDER — CELECOXIB 200 MG PO CAPS: 200.0000 mg | ORAL_CAPSULE | Freq: Two times a day (BID) | ORAL | 0 refills | Status: DC

## 2023-01-25 MED ORDER — LIDOCAINE 5 % EX PTCH: 1.0000 | MEDICATED_PATCH | CUTANEOUS | 0 refills | Status: AC

## 2023-01-25 MED ORDER — HYDROMORPHONE HCL 2 MG PO TABS: 1.0000 mg | ORAL_TABLET | Freq: Three times a day (TID) | ORAL | 0 refills | Status: AC

## 2023-01-25 MED ORDER — DULOXETINE HCL 20 MG PO CPEP: 20.0000 mg | ORAL_CAPSULE | Freq: Every day | ORAL | 0 refills | Status: DC

## 2023-01-25 MED ORDER — CEFAZOLIN IV (FOR PTA / DISCHARGE USE ONLY): 2.0000 g | Freq: Three times a day (TID) | INTRAVENOUS | 0 refills | Status: AC

## 2023-01-25 MED ORDER — ADULT MULTIVITAMIN W/MINERALS CH: 1.0000 | ORAL_TABLET | Freq: Every day | ORAL | Status: AC

## 2023-01-25 MED ORDER — PREDNISONE 10 MG PO TABS: ORAL_TABLET | ORAL | 0 refills | Status: AC

## 2023-01-25 MED ORDER — SENNOSIDES-DOCUSATE SODIUM 8.6-50 MG PO TABS: 1.0000 | ORAL_TABLET | Freq: Every evening | ORAL | 0 refills | Status: DC | PRN

## 2023-01-25 MED ORDER — OXYCODONE HCL 5 MG PO TABS: 5.0000 mg | ORAL_TABLET | Freq: Four times a day (QID) | ORAL | 0 refills | Status: DC | PRN

## 2023-01-25 NOTE — Progress Notes (Addendum)
Patient is being discharged home. All discharge instructions reviewed with pt including new medications, and line management. Education provided to patient and his wife at bedside. Home health infusion nurse Pam provided instructions on home infusion and patient verified a full understanding of those instructions. Pam called to verify extension setup and Pam confirmed that extension set up needed to be on prior to dc. Extension set applied. Patients wife is his ride home. DME equipment sent with patient (walker and 3n1).

## 2023-01-25 NOTE — Care Management Important Message (Signed)
Important Message  Patient Details  Name: Joseph [name] MRN: [id number] Date of Birth: 1980-07-19   Important Message Given:  Yes - Medicare IM  Late entry - Reviewed with the patient by phone on on 12/11 and stated he understood his rights. Forwarded the call to RN CM so she would know patient changed his mind about CIR and wants to discharge home.  I wished him a speedy recovery and thanked him for his time.   Olegario Messier A Ronell Boldin 01/25/2023, 9:36 AM

## 2023-01-25 NOTE — TOC Progression Note (Signed)
Transition of Care [facility]) - Progression Note    Patient Details  Name: Joseph [name] MRN: [id number] Date of Birth: 07-14-1980  Transition of Care [facility]) CM/SW Contact  Marlowe Sax, RN Phone Number: 01/25/2023, 9:17 AM  Clinical Narrative:    Confirmed with Pam at [facility] that she has completed teaching for Home Infusion, The patient and his spouse have done this before and are familiar with how to. Confirmed with Kandee Keen at [facility] that they will be seeing the patient for PT, OT and RN for the home infusion, Confirmed that [employer] will deliver a RW and a 3 in1 to the bedside prior to DC, The patient will need the midday infusion prior to going home   Expected Discharge Plan: Home w Home Health Services Barriers to Discharge: Continued Medical Work up  Expected Discharge Plan and Services   Discharge Planning Services: CM Consult   Living arrangements for the past 2 months: Single Family Home                 DME Arranged: Dan Humphreys rolling, Wheelchair manual, Bedside commode   Date DME Agency Contacted: 01/20/23     HH Arranged: PT, OT, RN HH Agency: [facility] Date [facility] Agency Contacted: 01/25/23 Time HH Agency Contacted: [phone] Representative spoke with at [facility] Agency: Kandee Keen   Social Determinants of Health (SDOH) Interventions SDOH Screenings   Food Insecurity: No Food Insecurity (01/18/2023)  Housing: Low Risk  (01/18/2023)  Transportation Needs: No Transportation Needs (01/18/2023)  Utilities: Not At Risk (01/18/2023)  Financial Resource Strain: Medium Risk (09/22/2022)   [facility]  Tobacco Use: High Risk (12/28/2022)   Received from [facility]    Readmission Risk Interventions    01/20/2023   11:21 AM 09/02/2021   12:30 PM 08/23/2021   11:05 AM  Readmission Risk Prevention Plan  Post Dischage Appt Complete  Complete  Medication Screening Complete  Complete  Transportation Screening Complete Complete Complete   PCP or Specialist Appt within 5-7 Days  Complete   Home Care Screening  Complete   Medication Review (RN CM)  Complete

## 2023-01-25 NOTE — Plan of Care (Signed)

## 2023-01-25 NOTE — Discharge Summary (Addendum)
Physician Discharge Summary   [name] MRN: [id number]  DOB: Dec 19, 1980   Admit:     Date of Admission: 01/17/2023 Admitted from: home   Discharge: Date of discharge: 01/25/23 Disposition: Home health Condition at discharge: good  CODE STATUS: FULL CODE     Discharge Physician: Joseph Nielsen, DO [facility]     PCP: Joseph Corner, [name]  Recommendations for Outpatient Follow-up:  Follow up with PCP Joseph [name], Joseph Finner, [name] in 1 weeks Please obtain labs/tests: CBC, BMP in 1-2 weeks Please follow up on the following pending results: none PCP AND OTHER OUTPATIENT PROVIDERS: SEE BELOW FOR SPECIFIC DISCHARGE INSTRUCTIONS PRINTED FOR PATIENT IN ADDITION TO GENERIC AVS PATIENT INFO  Follow with pulmonology asap Follow with infectious disease as directed    Discharge Instructions     [facility] pharmacist to adjust dose for Vancomycin, Aminoglycosides and other anti-infective therapies as requested by physician.   Complete by: As directed    [facility] to provide Cath Flo 2mg    Complete by: As directed    Administer for PICC line occlusion and as ordered by physician for other access device issues.   Anaphylaxis Kit: Provided to treat any anaphylactic reaction to the medication being provided to the patient if First Dose or when requested by physician   Complete by: As directed    Epinephrine 1mg /ml vial / amp: Administer 0.3mg  (0.70ml) subcutaneously once for moderate to severe anaphylaxis, nurse to call physician and pharmacy when reaction occurs and call 911 if needed for immediate care   Diphenhydramine 50mg /ml IV vial: Administer 25-50mg  IV/IM PRN for first dose reaction, rash, itching, mild reaction, nurse to call physician and pharmacy when reaction occurs   Sodium Chloride 0.9% NS IV: Administer if needed for hypovolemic blood pressure drop or as ordered by physician after call to physician with  anaphylactic reaction   Change dressing on IV access line weekly and PRN   Complete by: As directed    Diet general   Complete by: As directed    Flush IV access with Sodium Chloride 0.9% and Heparin 10 units/ml or 100 units/ml   Complete by: As directed    Home infusion instructions - [facility]   Complete by: As directed    Instructions: Flush IV access with Sodium Chloride 0.9% and Heparin 10units/ml or 100units/ml   Change dressing on IV access line: Weekly and PRN   Instructions Cath Flo 2mg : Administer for PICC Line occlusion and as ordered by physician for other access device   [facility] pharmacist to adjust dose for: Vancomycin, Aminoglycosides and other anti-infective therapies as requested by physician   Increase activity slowly   Complete by: As directed    Method of administration may be changed at the discretion of home infusion pharmacist based upon assessment of the patient and/or caregiver's ability to self-administer the medication ordered   Complete by: As directed          Discharge Diagnoses: Principal Problem:   MSSA bacteremia Active Problems:   Chronic respiratory failure (HCC)   Essential hypertension   Severe persistent asthma   Acute midline low back pain without sciatica   Degeneration of intervertebral disc of lumbar region with discogenic back pain       HPI: 42yo with h/o asthma/COPD on chronic steroids and 2L home O2, DM, and HTN who presented on 12/4 with worsening LBP and SOB.   Hospital course / significant events:  Imaging concerning for left iliac DVT.  Started on Heparin.  Underwent venogram on 12/5, no evidence of thrombus, vascular surgery signed off, Heparin discontinued.   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 Blood cultures growing MSSA, started on Cefazolin. ID is consulting, agreed with concern for discitis as the source but MRI is negative for discitis or epidural abscess so source  is uncertain, will need hip MRI.  Neurosurgery consulted, nonoperative management, pain control (will ask palliative care to help).  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 Negative scrotal US. Unclear source bacteremia - TEE delayed d/t (+)COVID, will need 4-6 weeks abx total, PICC is in place   Consultants:  Vascular Surgery Infectious Disease Neurosurgery Palliative Care  Pulmonology   Procedures/Surgeries: LLE venogram 12/5  PICC placement 12/8  Antibiotics: Ceftriaxone x 1 Vancomycin x 1 Cefazolin 12/5-now     ASSESSMENT & PLAN:   MSSA Bacteremia Patient with h/o MRSA septic bursitis presenting with back pain. CT showed L4-S1 osteo/discitis but MRI (without contrast) did not show this definitively and MRI with contrast ruled this out as source neg TB Ruled out HIV Abx plan per ID: Cefazolin 2 grams IV every 8 hours x 4 weeks, End Date: 02/15/23 Neurosurgery consulted - no intervention planned, consider outpatient followup  Of note, TTE Echo unremarkable, plan was for TEE with [facility] Thursday, 12/12 - which was canceled due to COVID infection though this infection is suspect given had negative test as well and minimal symptoms. Since TEE cannot be done due to respiratory status, he will need a minimum of 4 weeks of IV Cefazolin per ID - see above    Abnormal imaging  There was concern for L iliac DVT, although venogram was negative so heparin stopped and vascular signed off  Lung nodule Legionella NR, strep pneumo neg  Pulmonary consulted - pt sees Dr Joseph [name] outpatient and has declined to see Dr Joseph [name] or other pulmonary specialist here  Reimage post treatment  Continue abx as above  Decadron 8 mg bid --> prednisone taper  Trend CRP  COPD/asthma exacerbation  positive then negative test for COVID-19 infection Chronic respiratory failure on 2L O2 [place] at home Chronic steroids at home  Last saw pulm Dr. 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 at home) Resume home Incruse Ellipta and Singulair Continue bronchodilators On Dupixent every 2 weeks - hold next dose as this can exacerbate wheezing Added IS and flutter valve Fortunately, he currently has mild nasal congestion and persistent wheezing but does not appear to be seriously ill   Acute on chronic low back pain / groin pain attributed to: Moderate Spinal stenosis w/ L3-4 and L4-5 disc bulging  L femoral head osteonecrosis and tendinosis L hip labral teat  MRI without evidence of discitis or epidural abscess, no other obvious source bacteremia, Scrotal US negative Neurosurgery consulted --> no intervention  Palliative care is assisting with pain control He has chronic ambulatory dysfunction and now acute worsening; was planned for CIR but is improving and now prefers home with home health  Added Celebrex for further pain control Pain control with PO Dilaudid TID + Oxy prn breakthrough   DM New onset, A1c 6.8 May be related to steroid-induced hyperglycemia added moderate-scale SSI (patient is declining this treatment - will d/c orders)   HTN home HCTZ and losartan   GERD Protonix 40 mg daily (in lieu of home omeprazole)  Depression Patient and wife report long-standing depression He has rage issues and is reluctant to try SSRI Given his chronic pain, he is willing to trial Duloxetine   Class 1 Obesity Body mass index is 34.96 kg/m.Marland Kitchen  Weight loss should be encouraged Outpatient PCP/bariatric medicine f/u encouraged   Nutrition Problem: Inadequate oral intake Etiology: poor appetite Signs/Symptoms: per patient/family report Interventions: Ensure Enlive (each supplement provides 350kcal and 20 grams of protein), MVI, Liberalize Diet            Discharge Instructions  Allergies as of 01/25/2023       Reactions   Penicillins Shortness Of Breath   Pork-derived Products  Nausea Only   Tomato Rash   Banana Hives   Coconut (cocos Nucifera) Hives        Medication List     STOP taking these medications    ibuprofen 800 MG tablet Commonly known as: ADVIL       TAKE these medications    albuterol 108 (90 Base) MCG/ACT inhaler Commonly known as: VENTOLIN HFA Inhale 2 puffs into the lungs every 6 (six) hours as needed for wheezing or shortness of breath.   albuterol (2.5 MG/3ML) 0.083% nebulizer solution Commonly known as: PROVENTIL Take 2.5 mg by nebulization every 6 (six) hours as needed for wheezing or shortness of breath.   ceFAZolin IVPB Commonly known as: ANCEF Inject 2 g into the vein every 8 (eight) hours for 21 days. Indication:  MSSA bacteremia First Dose: Yes Last Day of Therapy:  02/15/2023 Labs - Once weekly:  CBC/D, CMP, ESR and CRP Fax weekly lab results  promptly to [phone] Please pull PICC at completion of IV antibiotics Method of administration: IV Push Method of administration may be changed at the discretion of home infusion pharmacist based upon assessment of the patient and/or caregiver's ability to self-administer the medication ordered.   celecoxib 200 MG capsule Commonly known as: CELEBREX Take 1 capsule (200 mg total) by mouth 2 (two) times daily.   DULoxetine 20 MG capsule Commonly known as: CYMBALTA Take 1 capsule (20 mg total) by mouth daily. Start taking on: January 26, 2023   Dupixent 300 MG/2ML prefilled syringe Generic drug: dupilumab Inject 300 mg into the skin every 14 (fourteen) days.   furosemide 20 MG tablet Commonly known as: LASIX Take 20 mg by mouth daily as needed for edema or fluid.   gabapentin 300 MG capsule Commonly known as: NEURONTIN Take 300 mg by mouth 3 (three) times daily as needed (Neuropathy pain).   hydrochlorothiazide 25 MG tablet Commonly known as: HYDRODIURIL Take 25 mg by mouth daily.   HYDROmorphone 2 MG tablet Commonly known as: DILAUDID Take 0.5-1 tablets  (1-2 mg total) by mouth 3 (three) times daily for 5 days.   Incruse Ellipta 62.5 MCG/ACT Aepb Generic drug: umeclidinium bromide Inhale 1 puff into the lungs daily.   lidocaine 5 % Commonly known as: LIDODERM Place 1 patch onto the skin daily. Remove & Discard patch within 12 hours or as directed by MD Start taking on: January 26, 2023   losartan 100 MG tablet Commonly known as: COZAAR Take 1 tablet (100 mg total) by mouth daily.   montelukast 10 MG tablet Commonly known as: SINGULAIR Take 10 mg by mouth at bedtime.   multivitamin with minerals Tabs tablet Take 1 tablet by mouth daily. Start taking on: January 26, 2023   omeprazole 20 MG capsule Commonly known as: PRILOSEC Take 20 mg by mouth daily as needed (  GERD symptoms).   oxyCODONE 5 MG immediate release tablet Commonly known as: Oxy IR/ROXICODONE Take 1-2 tablets (5-10 mg total) by mouth every 6 (six) hours as needed for breakthrough pain.   predniSONE 10 MG tablet Commonly known as: DELTASONE Take 4 tablets (40 mg total) by mouth daily for 3 days, THEN 3 tablets (30 mg total) daily for 3 days, THEN 2 tablets (20 mg total) daily for 3 days, THEN 1 tablet (10 mg total) daily for 3 days. After this, can restart home dose 7.5 mg po daily. Start taking on: January 25, 2023 What changed:  medication strength See the new instructions.   QUEtiapine 25 MG tablet Commonly known as: SEROQUEL Take 1 tablet (25 mg total) by mouth at bedtime.   senna-docusate 8.6-50 MG tablet Commonly known as: Senokot-S Take 1 tablet by mouth at bedtime as needed for mild constipation.   tiZANidine 2 MG tablet Commonly known as: ZANAFLEX Take 2 mg by mouth 3 (three) times daily.               Durable Medical Equipment  (From admission, onward)           Start     Ordered   01/24/23 1840  For home use only DME wheelchair cushion (seat and back)  Once        01/24/23 1839   01/24/23 1839  For home use only DME Bedside  commode  Once       Question:  Patient needs a bedside commode to treat with the following condition  Answer:  Lower back pain   01/24/23 1839   01/24/23 1839  For home use only DME Walker rolling  Once       Question Answer Comment  Walker: With 5 Inch Wheels   Patient needs a walker to treat with the following condition Low back pain      01/24/23 1839   01/24/23 1839  For home use only DME standard manual wheelchair with seat cushion  Once       Comments: Patient suffers from low back pain which impairs their ability to perform daily activities like bathing, dressing, grooming, and toileting in the home.  A cane, crutch, or walker will not resolve issue with performing activities of daily living. A wheelchair will allow patient to safely perform daily activities. Patient can safely propel the wheelchair in the home or has a caregiver who can provide assistance. Length of need 12 months . Accessories: elevating leg rests (ELRs), wheel locks, extensions and anti-tippers.   01/24/23 1839              Discharge Care Instructions  (From admission, onward)           Start     Ordered   01/25/23 0000  Change dressing on IV access line weekly and PRN  (Home infusion instructions - [facility] )        01/25/23 1450              Allergies  Allergen Reactions   Penicillins Shortness Of Breath   Pork-Derived Products Nausea Only   Tomato Rash   Banana Hives   Coconut (Cocos Nucifera) Hives     Subjective: pt feeling better today, no chest pain / SOB, tolerating diet, ambulating    Discharge Exam: BP (!) 130/95 (BP Location: Left Arm)   Pulse 99   Temp 98.3 F (36.8 C) (Oral)   Resp 18   Ht 5' (1.524  m)   Wt 81.2 kg   SpO2 96%   BMI 34.96 kg/m  General: Pt is alert, awake, not in acute distress Cardiovascular: RRR, S1/S2 +, no rubs, no gallops Respiratory: CTA bilaterally, no wheezing, no rhonchi Abdominal: Soft, NT, ND, bowel sounds  + Extremities: no edema, no cyanosis     The results of significant diagnostics from this hospitalization (including imaging, microbiology, ancillary and laboratory) are listed below for reference.     Microbiology: Recent Results (from the past 240 hours)  Blood Culture (routine x 2)     Status: Abnormal   Collection Time: 01/17/23  1:41 PM   Specimen: BLOOD  Result Value Ref Range Status   Specimen Description   Final    BLOOD BLOOD LEFT HAND Performed at [facility], [address], [place], [place] [address]    Special Requests   Final    BOTTLES DRAWN AEROBIC AND ANAEROBIC Blood Culture adequate volume Performed at [facility], [address] [place], [place] [address]    Culture  Setup Time   Final    GRAM POSITIVE COCCI IN BOTH AEROBIC AND ANAEROBIC BOTTLES CRITICAL RESULT CALLED TO, READ BACK BY AND VERIFIED WITH:  Gloriann Loan AT 8295 01/18/23 JG Performed at [facility], [address] [place], [place] [address]    Culture (A)  Final    STAPHYLOCOCCUS AUREUS SUSCEPTIBILITIES PERFORMED ON PREVIOUS CULTURE WITHIN THE LAST 5 DAYS. Performed at [facility], [address], [place], [place] [address]    Report Status 01/20/2023 FINAL  Final  Blood Culture (routine x 2)     Status: Abnormal   Collection Time: 01/17/23  1:41 PM   Specimen: BLOOD  Result Value Ref Range Status   Specimen Description   Final    BLOOD BLOOD LEFT FOREARM Performed at [facility], [address] [place], [place] [address]    Special Requests   Final    BOTTLES DRAWN AEROBIC AND ANAEROBIC Blood Culture adequate volume Performed at [facility], [address] [place], [place] [address]    Culture  Setup Time   Final    GRAM POSITIVE COCCI IN BOTH AEROBIC AND ANAEROBIC BOTTLES CRITICAL RESULT CALLED TO, READ BACK BY AND VERIFIED WITH:  Gloriann Loan AT 1324 01/18/23 JG Performed at [facility], [address] [place], [place] [address]    Culture STAPHYLOCOCCUS AUREUS (A)  Final   Report Status 01/20/2023 FINAL  Final   Organism ID, Bacteria STAPHYLOCOCCUS AUREUS  Final      Susceptibility   Staphylococcus aureus - MIC*    CIPROFLOXACIN <=0.5 SENSITIVE Sensitive     ERYTHROMYCIN >=8 RESISTANT Resistant     GENTAMICIN <=0.5 SENSITIVE Sensitive     OXACILLIN <=0.25 SENSITIVE Sensitive     TETRACYCLINE <=1 SENSITIVE Sensitive     VANCOMYCIN 1 SENSITIVE Sensitive     TRIMETH/SULFA <=10 SENSITIVE Sensitive     CLINDAMYCIN RESISTANT Resistant     RIFAMPIN <=0.5 SENSITIVE Sensitive     Inducible Clindamycin POSITIVE Resistant     LINEZOLID 2 SENSITIVE Sensitive     * STAPHYLOCOCCUS AUREUS  Blood Culture ID Panel (Reflexed)     Status: Abnormal   Collection Time: 01/17/23  1:41 PM  Result Value Ref Range Status   Enterococcus faecalis NOT DETECTED NOT DETECTED Final   Enterococcus Faecium NOT DETECTED NOT DETECTED Final   Listeria monocytogenes NOT DETECTED NOT DETECTED Final   Staphylococcus species  DETECTED (A) NOT DETECTED Final    Comment: CRITICAL RESULT CALLED TO, READ BACK BY AND VERIFIED WITH:  TREY GREENWOOD AT 0981 01/18/23 JG    Staphylococcus aureus (BCID) DETECTED (A) NOT DETECTED Final    Comment: CRITICAL RESULT CALLED TO, READ BACK BY AND VERIFIED WITH:  Gloriann Loan AT 0705 01/18/23 JG    Staphylococcus epidermidis NOT DETECTED NOT DETECTED Final   Staphylococcus lugdunensis NOT DETECTED NOT DETECTED Final   Streptococcus species NOT DETECTED NOT DETECTED Final   Streptococcus agalactiae NOT DETECTED NOT DETECTED Final   Streptococcus pneumoniae NOT DETECTED NOT DETECTED Final   Streptococcus pyogenes NOT DETECTED NOT DETECTED Final   A.calcoaceticus-baumannii NOT DETECTED NOT DETECTED Final   Bacteroides fragilis NOT DETECTED NOT DETECTED Final   Enterobacterales NOT DETECTED NOT DETECTED Final   Enterobacter cloacae complex NOT DETECTED NOT DETECTED Final   Escherichia  coli NOT DETECTED NOT DETECTED Final   Klebsiella aerogenes NOT DETECTED NOT DETECTED Final   Klebsiella oxytoca NOT DETECTED NOT DETECTED Final   Klebsiella pneumoniae NOT DETECTED NOT DETECTED Final   Proteus species NOT DETECTED NOT DETECTED Final   Salmonella species NOT DETECTED NOT DETECTED Final   Serratia marcescens NOT DETECTED NOT DETECTED Final   Haemophilus influenzae NOT DETECTED NOT DETECTED Final   Neisseria meningitidis NOT DETECTED NOT DETECTED Final   Pseudomonas aeruginosa NOT DETECTED NOT DETECTED Final   Stenotrophomonas maltophilia NOT DETECTED NOT DETECTED Final   Candida albicans NOT DETECTED NOT DETECTED Final   Candida auris NOT DETECTED NOT DETECTED Final   Candida glabrata NOT DETECTED NOT DETECTED Final   Candida krusei NOT DETECTED NOT DETECTED Final   Candida parapsilosis NOT DETECTED NOT DETECTED Final   Candida tropicalis NOT DETECTED NOT DETECTED Final   Cryptococcus neoformans/gattii NOT DETECTED NOT DETECTED Final   Meth resistant mecA/C and MREJ NOT DETECTED NOT DETECTED Final    Comment: Performed at [facility], [address] [place], [place] [address]  Culture, blood (Routine X 2) w Reflex to ID Panel     Status: None   Collection Time: 01/19/23  5:19 AM   Specimen: BLOOD  Result Value Ref Range Status   Specimen Description BLOOD BLOOD LEFT ARM  Final   Special Requests   Final    BOTTLES DRAWN AEROBIC AND ANAEROBIC Blood Culture adequate volume   Culture   Final    NO GROWTH 5 DAYS Performed at [facility], [address] [place], [place] [address]    Report Status 01/24/2023 FINAL  Final  Culture, blood (Routine X 2) w Reflex to ID Panel     Status: None   Collection Time: 01/19/23  5:27 AM   Specimen: BLOOD  Result Value Ref Range Status   Specimen Description BLOOD BLOOD RIGHT HAND  Final   Special Requests   Final    BOTTLES DRAWN AEROBIC AND ANAEROBIC Blood Culture adequate volume   Culture   Final    NO  GROWTH 5 DAYS Performed at [facility], [address] [place], [place] [address]    Report Status 01/24/2023 FINAL  Final  SARS Coronavirus 2 by RT PCR (hospital order, performed in [facility] hospital lab) *cepheid single result test* Anterior Nasal Swab     Status: Abnormal   Collection Time: 01/23/23  8:35 AM   Specimen: Anterior Nasal Swab  Result Value Ref Range Status   SARS Coronavirus 2 by RT PCR POSITIVE (A) NEGATIVE Final    Comment: (  NOTE) SARS-CoV-2 target nucleic acids are DETECTED  SARS-CoV-2 RNA is generally detectable in upper respiratory specimens  during the acute phase of infection.  Positive results are indicative  of the presence of the identified virus, but do not rule out bacterial infection or co-infection with other pathogens not detected by the test.  Clinical correlation with patient history and  other diagnostic information is necessary to determine patient infection status.  The expected result is negative.  Fact Sheet for Patients:   RoadLapTop.co.za   Fact Sheet for Healthcare Providers:   http://kim-miller.com/    This test is not yet approved or cleared by the [place] FDA and  has been authorized for detection and/or diagnosis of SARS-CoV-2 by FDA under an Emergency Use Authorization (EUA).  This EUA will remain in effect (meaning this test can be used) for the duration of  the COVID-19 declaration under Section 564(b)(1)  of the Act, 21 U.S.C. section 360-bbb-3(b)(1), unless the authorization is terminated or revoked sooner.   Performed at [facility], [address] [place], [place] [address]   Respiratory (~20 pathogens) panel by PCR     Status: None   Collection Time: 01/23/23  8:35 AM   Specimen: Nasopharyngeal Swab; Respiratory  Result Value Ref Range Status   Adenovirus NOT DETECTED NOT DETECTED Final   Coronavirus 229E NOT DETECTED NOT DETECTED Final     Comment: (NOTE) The Coronavirus on the Respiratory Panel, DOES NOT test for the novel  Coronavirus (2019 nCoV)    Coronavirus HKU1 NOT DETECTED NOT DETECTED Final   Coronavirus NL63 NOT DETECTED NOT DETECTED Final   Coronavirus OC43 NOT DETECTED NOT DETECTED Final   Metapneumovirus NOT DETECTED NOT DETECTED Final   Rhinovirus / Enterovirus NOT DETECTED NOT DETECTED Final   Influenza A NOT DETECTED NOT DETECTED Final   Influenza B NOT DETECTED NOT DETECTED Final   Parainfluenza Virus 1 NOT DETECTED NOT DETECTED Final   Parainfluenza Virus 2 NOT DETECTED NOT DETECTED Final   Parainfluenza Virus 3 NOT DETECTED NOT DETECTED Final   Parainfluenza Virus 4 NOT DETECTED NOT DETECTED Final   Respiratory Syncytial Virus NOT DETECTED NOT DETECTED Final   Bordetella pertussis NOT DETECTED NOT DETECTED Final   Bordetella Parapertussis NOT DETECTED NOT DETECTED Final   Chlamydophila pneumoniae NOT DETECTED NOT DETECTED Final   Mycoplasma pneumoniae NOT DETECTED NOT DETECTED Final    Comment: Performed at [facility], [address], [place], [place] [address]  SARS Coronavirus 2 by RT PCR (hospital order, performed in [facility] hospital lab) *cepheid single result test* Anterior Nasal Swab     Status: None   Collection Time: 01/23/23  1:09 PM   Specimen: Anterior Nasal Swab  Result Value Ref Range Status   SARS Coronavirus 2 by RT PCR NEGATIVE NEGATIVE Final    Comment: (NOTE) SARS-CoV-2 target nucleic acids are NOT DETECTED.  The SARS-CoV-2 RNA is generally detectable in upper and lower respiratory specimens during the acute phase of infection. The lowest concentration of SARS-CoV-2 viral copies this assay can detect is 250 copies / mL. A negative result does not preclude SARS-CoV-2 infection and should not be used as the sole basis for treatment or other patient management decisions.  A negative result may occur with improper specimen collection / handling, submission of specimen  other than nasopharyngeal swab, presence of viral mutation(s) within the areas targeted by this assay, and inadequate number of viral copies (<250 copies / mL). A negative result must  be combined with clinical observations, patient history, and epidemiological information.  Fact Sheet for Patients:   RoadLapTop.co.za  Fact Sheet for Healthcare Providers: http://kim-miller.com/  This test is not yet approved or  cleared by the [place] FDA and has been authorized for detection and/or diagnosis of SARS-CoV-2 by FDA under an Emergency Use Authorization (EUA).  This EUA will remain in effect (meaning this test can be used) for the duration of the COVID-19 declaration under Section 564(b)(1) of the Act, 21 U.S.C. section 360bbb-3(b)(1), unless the authorization is terminated or revoked sooner.  Performed at [facility], [address] [place], [place] [address]      Labs: BNP (last 3 results) No results for input(s): "BNP" in the last 8760 hours. Basic Metabolic Panel: Recent Labs  Lab 01/20/23 0318 01/22/23 0219 01/23/23 0539 01/24/23 0950  NA 136 137 135 135  K 3.9 3.6 3.7 4.2  CL 97* 96* 95* 95*  CO2 31 31 32 31  GLUCOSE 98 104* 105* 134*  BUN 18 31* 22* 31*  CREATININE 0.78 0.77 0.74 0.94  CALCIUM 8.5* 8.8* 9.1 8.8*   Liver Function Tests: No results for input(s): "AST", "ALT", "ALKPHOS", "BILITOT", "PROT", "ALBUMIN" in the last 168 hours. No results for input(s): "LIPASE", "AMYLASE" in the last 168 hours. No results for input(s): "AMMONIA" in the last 168 hours. CBC: Recent Labs  Lab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

## 2023-01-25 NOTE — Progress Notes (Signed)
PHARMACY CONSULT NOTE FOR:  OUTPATIENT  PARENTERAL ANTIBIOTIC THERAPY (OPAT)  Indication: MSSA bacteremia Regimen: Cefazolin 2gm IV q8h End date: 02/15/2023  Labs - Once weekly:  CBC/D, CMP, ESR and CRP Fax weekly lab results  promptly to [phone] Please pull PICC at completion of IV antibiotics  IV antibiotic discharge orders are pended. To discharging provider:  please sign these orders via discharge navigator,  Select New Orders & click on the button choice - Manage This Unsigned Work.     Thank you for allowing pharmacy to be a part of this patient's care.  Juliette Alcide, PharmD, BCPS, BCIDP Work Cell: [phone] 01/25/2023 8:22 AM

## 2023-01-30 LAB — LAB REPORT - SCANNED: EGFR: 103

## 2023-02-02 ENCOUNTER — Inpatient Hospital Stay: Payer: Medicare Other | Attending: Oncology | Admitting: Oncology

## 2023-02-02 ENCOUNTER — Inpatient Hospital Stay: Payer: Medicare Other

## 2023-02-02 ENCOUNTER — Encounter: Payer: Self-pay | Admitting: Oncology

## 2023-02-02 VITALS — BP 138/93 | HR 101 | Temp 97.3°F | Resp 18 | Wt 175.0 lb

## 2023-02-02 DIAGNOSIS — D75839 Thrombocytosis, unspecified: Secondary | ICD-10-CM | POA: Insufficient documentation

## 2023-02-02 DIAGNOSIS — R7881 Bacteremia: Secondary | ICD-10-CM | POA: Diagnosis present

## 2023-02-02 DIAGNOSIS — Z720 Tobacco use: Secondary | ICD-10-CM

## 2023-02-02 DIAGNOSIS — R5383 Other fatigue: Secondary | ICD-10-CM

## 2023-02-02 DIAGNOSIS — F17210 Nicotine dependence, cigarettes, uncomplicated: Secondary | ICD-10-CM | POA: Diagnosis not present

## 2023-02-02 DIAGNOSIS — F1290 Cannabis use, unspecified, uncomplicated: Secondary | ICD-10-CM | POA: Insufficient documentation

## 2023-02-02 DIAGNOSIS — J4489 Other specified chronic obstructive pulmonary disease: Secondary | ICD-10-CM | POA: Diagnosis not present

## 2023-02-02 DIAGNOSIS — B9561 Methicillin susceptible Staphylococcus aureus infection as the cause of diseases classified elsewhere: Secondary | ICD-10-CM | POA: Diagnosis present

## 2023-02-02 DIAGNOSIS — R61 Generalized hyperhidrosis: Secondary | ICD-10-CM

## 2023-02-02 DIAGNOSIS — Z79899 Other long term (current) drug therapy: Secondary | ICD-10-CM | POA: Diagnosis not present

## 2023-02-02 DIAGNOSIS — Z8041 Family history of malignant neoplasm of ovary: Secondary | ICD-10-CM | POA: Insufficient documentation

## 2023-02-02 DIAGNOSIS — D72829 Elevated white blood cell count, unspecified: Secondary | ICD-10-CM

## 2023-02-02 DIAGNOSIS — D72828 Other elevated white blood cell count: Secondary | ICD-10-CM | POA: Diagnosis present

## 2023-02-02 DIAGNOSIS — I10 Essential (primary) hypertension: Secondary | ICD-10-CM | POA: Diagnosis not present

## 2023-02-02 LAB — CBC WITH DIFFERENTIAL/PLATELET
Abs Immature Granulocytes: 1.39 10*3/uL — ABNORMAL HIGH (ref 0.00–0.07)
Band Neutrophils: 0 %
Basophils Absolute: 0 10*3/uL (ref 0.0–0.1)
Basophils Relative: 0 %
Blasts: 0 %
Eosinophils Absolute: 0 10*3/uL (ref 0.0–0.5)
Eosinophils Relative: 0 %
HCT: 36.6 % — ABNORMAL LOW (ref 39.0–52.0)
Hemoglobin: 11.5 g/dL — ABNORMAL LOW (ref 13.0–17.0)
Immature Granulocytes: 5 %
Lymphocytes Relative: 4 %
Lymphs Abs: 1.1 10*3/uL (ref 0.7–4.0)
MCH: 26.1 pg (ref 26.0–34.0)
MCHC: 31.4 g/dL (ref 30.0–36.0)
MCV: 83.2 fL (ref 80.0–100.0)
Metamyelocytes Relative: 0 %
Monocytes Absolute: 1.4 10*3/uL — ABNORMAL HIGH (ref 0.1–1.0)
Monocytes Relative: 5 %
Myelocytes: 0 %
Neutro Abs: 23.8 10*3/uL — ABNORMAL HIGH (ref 1.7–7.7)
Neutrophils Relative %: 86 %
Other: 0 %
Platelets: 551 10*3/uL — ABNORMAL HIGH (ref 150–400)
Promyelocytes Relative: 0 %
RBC: 4.4 MIL/uL (ref 4.22–5.81)
RDW: 18.6 % — ABNORMAL HIGH (ref 11.5–15.5)
Smear Review: INCREASED
WBC: 27.7 10*3/uL — ABNORMAL HIGH (ref 4.0–10.5)
nRBC: 0 /100{WBCs}
nRBC: 0.1 % (ref 0.0–0.2)

## 2023-02-02 LAB — HEPATIC FUNCTION PANEL
ALT: 7 U/L (ref 0–44)
AST: 19 U/L (ref 15–41)
Albumin: 3 g/dL — ABNORMAL LOW (ref 3.5–5.0)
Alkaline Phosphatase: 63 U/L (ref 38–126)
Bilirubin, Direct: <0.1 mg/dL (ref 0.0–0.2)
Total Bilirubin: 0.4 mg/dL (ref <1.2)
Total Protein: 6.2 g/dL — ABNORMAL LOW (ref 6.5–8.1)

## 2023-02-02 LAB — HEPATITIS PANEL, ACUTE
HCV Ab: NONREACTIVE
Hep A IgM: NONREACTIVE
Hep B C IgM: NONREACTIVE
Hepatitis B Surface Ag: NONREACTIVE

## 2023-02-02 LAB — TECHNOLOGIST SMEAR REVIEW

## 2023-02-02 LAB — LACTATE DEHYDROGENASE: LDH: 188 U/L (ref 98–192)

## 2023-02-02 NOTE — Assessment & Plan Note (Signed)
Recommend smoke cessation.  

## 2023-02-02 NOTE — Assessment & Plan Note (Signed)
Pending above work up 

## 2023-02-02 NOTE — Assessment & Plan Note (Addendum)
Chronic leukocytosis, predominantly neutrophilia as well as monocytosis., likely reactive due to chronic steroid use, and recent infection.  Rule out other etiologies. Check CBC, smear, peripheral flow cytometry, multiple myeloma panel, light chain ratio, LDH.  BCR-ABL 1 FISH, JAK2 mutation with reflex.  Hepatitis panel.  LFT.

## 2023-02-02 NOTE — Assessment & Plan Note (Signed)
To finish 4 weeks of cefazolin and follow-up with ID.

## 2023-02-02 NOTE — Progress Notes (Signed)
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 herniation with upward and downward migration. No compressive stenosis of the distal thecal sac. Some potential that either S1 nerve could be affected by the caudally migrated disc material. IMPRESSION: 1. 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 This explains the CT finding. This is a significant clot burden and could place the patient at risk of large pulmonary embolism. 2. L3-4: Moderate bulging of the disc. Stenosis of both lateral recesses with some potential for neural compression. 3. L4-5: Disc degeneration with fluid intensity material, not likely to be infected. Endplate osteophytes and bulging of the disc. Moderate stenosis which could be symptomatic. 4. L5-S1: Endplate osteophytes, broad-based disc herniation with upward and downward migration. No compressive stenosis of the distal thecal sac. Some potential that either S1 nerve could be affected by the caudally migrated disc material. 5. These results were called by telephone at the time of interpretation on 01/17/2023 at 3:25 pm to provider [facility] , who verbally acknowledged these results. Electronically Signed   By: Paulina Fusi M.D.   On: 01/17/2023 15:30   CT ABDOMEN PELVIS W CONTRAST Result Date: 01/17/2023 CLINICAL DATA:  LLQ abdominal pain Diverticulitis, complication suspected EXAM: CT ABDOMEN AND PELVIS WITH CONTRAST TECHNIQUE: Multidetector CT imaging of the abdomen and pelvis was performed using the standard protocol following bolus administration of intravenous contrast. RADIATION DOSE REDUCTION: This exam was performed according to the departmental dose-optimization program which includes automated exposure control, adjustment of the mA and/or kV according to patient size and/or use of iterative reconstruction technique. CONTRAST:  OMNIPAQUE IOHEXOL 300 MG/ML  SOLN COMPARISON:  None Available. FINDINGS: Lower chest: Lung bases are clear. Hepatobiliary: Liver has a normal contour. No focal liver lesions are visualized. No perihepatic fluid. No evidence of intra or extrahepatic biliary ductal dilatation. No evidence of cholelithiasis or cholecystitis. Pancreas: No evidence of peripancreatic fat stranding to suggest pancreatitis. Spleen: Normal in size without focal abnormality. Adrenals/Urinary Tract: Bilateral adrenal glands are normal in appearance. Bilateral kidneys enhance homogeneously and symmetrically. No evidence of hydronephrosis or nephrolithiasis. There is a hypodense lesion of the interpolar region of the right kidney, favored to represent a simple renal cyst. Urinary bladder is fluid-filled without focal abnormalities. Stomach/Bowel: No evidence of bowel obstruction. There is mild diverticulosis without evidence of diverticulitis. The appendix is normal in appearance. Vascular/Lymphatic: No significant  vascular findings are present. No enlarged abdominal or pelvic lymph nodes. Reproductive: Prostate is unremarkable. Other: No abdominal wall hernia or abnormality. No abdominopelvic ascites. Musculoskeletal: No fracture  is seen. Chronic bilateral rib fractures. There is nonspecific soft tissue stranding in the prevertebral spaces of the L4-L5 and L5-S1 levels with mild endplate irregularity. While nonspecific, findings can be seen in the setting of discitis/osteomyelitis. Recommend further evaluation with a contrast-enhanced lumbar spine MRI IMPRESSION: 1. Nonspecific soft tissue stranding in the prevertebral spaces of the L4-L5 and L5-S1 levels with mild endplate irregularity. While nonspecific, findings can be seen in the setting of discitis/osteomyelitis. Recommend further evaluation with a contrast-enhanced lumbar spine MRI. An additional less likely differential consideration is possible thrombophlebitis of the adjacent common iliac vein. Recommend attention on MRI. 2. Mild diverticulosis without evidence of diverticulitis. Electronically Signed   By: Lorenza Cambridge M.D.   On: 01/17/2023 12:37   DG Hip Unilat W or Wo Pelvis 2-3 Views Left Result Date: 01/17/2023 CLINICAL DATA:  Low back pain. EXAM: DG HIP (WITH OR WITHOUT PELVIS) 2-3V LEFT COMPARISON:  None Available. FINDINGS: There is no evidence of hip fracture or dislocation. Mild joint space narrowing superiorly of the bilateral hips. There is a 13 mm focus of mineralization adjacent to the superior margin of the left greater trochanter, which could represent calcific tendinitis. The sacroiliac joints and pubic symphysis are anatomically aligned. IMPRESSION: 1. No acute osseous abnormality. 2. Focus of mineralization adjacent to the superior margin of the left greater trochanter may represent calcific tendinitis. Electronically Signed   By: Hart Robinsons M.D.   On: 01/17/2023 11:06   DG Lumbar Spine 2-3 Views Result Date: 01/17/2023 CLINICAL DATA:  Lower back pain EXAM: LUMBAR SPINE - 3 VIEW COMPARISON:  Lumbar spine radiograph dated 02/02/2021 FINDINGS: There is no evidence of lumbar spine fracture. Straightening of the lumbar lordosis. Grade 1 retrolisthesis at  L5-S1, unchanged. Multilevel degenerative changes of the lumbar spine with intervertebral disc space narrowing at L2-S1, slightly increased at L3-4. Old distal sacral fracture. IMPRESSION: 1. Multilevel degenerative changes of the lumbar spine, slightly increased at L3-4. 2. Unchanged grade 1 retrolisthesis at L5-S1. Electronically Signed   By: Agustin Cree M.D.   On: 01/17/2023 10:59   DG Chest Portable 1 View Result Date: 01/17/2023 CLINICAL DATA:  Shortness of breath. EXAM: PORTABLE CHEST 1 VIEW COMPARISON:  August 29, 2021.  October 13, 2022. FINDINGS: The heart size and mediastinal contours are within normal limits. Both lungs are clear. Old bilateral rib fractures are noted. IMPRESSION: No active disease. Electronically Signed   By: Lupita Raider M.D.   On: 01/17/2023 09:57    LABORATORY DATA:  I have reviewed the data as listed    Latest Ref Rng & Units 02/02/2023   10:05 AM 01/24/2023    9:50 AM 01/23/2023    5:39 AM  CBC  WBC 4.0 - 10.5 K/uL 27.7  27.9  24.4   Hemoglobin 13.0 - 17.0 g/dL 11.9  14.7  82.9   Hematocrit 39.0 - 52.0 % 36.6  34.4  37.8   Platelets 150 - 400 K/uL 551  549  462       Latest Ref Rng & Units 02/02/2023   10:05 AM 01/24/2023    9:50 AM 01/23/2023    5:39 AM  CMP  Glucose 70 - 99 mg/dL  562  130   BUN 6 - 20 mg/dL  31  22   Creatinine 8.65 - 1.24 mg/dL  0.94  0.74   Sodium 135 - 145 mmol/L  135  135   Potassium 3.5 - 5.1 mmol/L  4.2  3.7   Chloride 98 - 111 mmol/L  95  95   CO2 22 - 32 mmol/L  31  32   Calcium 8.9 - 10.3 mg/dL  8.8  9.1   Total Protein 6.5 - 8.1 g/dL 6.2     Total Bilirubin <1.2 mg/dL 0.4     Alkaline Phos 38 - 126 U/L 63     AST 15 - 41 U/L 19     ALT 0 - 44 U/L 7

## 2023-02-05 LAB — COMP PANEL: LEUKEMIA/LYMPHOMA

## 2023-02-05 LAB — KAPPA/LAMBDA LIGHT CHAINS
Kappa free light chain: 30.5 mg/L — ABNORMAL HIGH (ref 3.3–19.4)
Kappa, lambda light chain ratio: 1.49 (ref 0.26–1.65)
Lambda free light chains: 20.5 mg/L (ref 5.7–26.3)

## 2023-02-06 LAB — LAB REPORT - SCANNED: EGFR: 117

## 2023-02-08 ENCOUNTER — Ambulatory Visit: Payer: Medicare Other | Attending: Infectious Diseases | Admitting: Infectious Diseases

## 2023-02-08 ENCOUNTER — Encounter: Payer: Self-pay | Admitting: Infectious Diseases

## 2023-02-08 VITALS — BP 121/88 | HR 116 | Temp 97.2°F | Ht 65.0 in | Wt 169.0 lb

## 2023-02-08 DIAGNOSIS — R262 Difficulty in walking, not elsewhere classified: Secondary | ICD-10-CM | POA: Diagnosis not present

## 2023-02-08 DIAGNOSIS — Z7952 Long term (current) use of systemic steroids: Secondary | ICD-10-CM | POA: Insufficient documentation

## 2023-02-08 DIAGNOSIS — J4489 Other specified chronic obstructive pulmonary disease: Secondary | ICD-10-CM | POA: Insufficient documentation

## 2023-02-08 DIAGNOSIS — J4550 Severe persistent asthma, uncomplicated: Secondary | ICD-10-CM | POA: Diagnosis not present

## 2023-02-08 DIAGNOSIS — M25552 Pain in left hip: Secondary | ICD-10-CM | POA: Diagnosis not present

## 2023-02-08 DIAGNOSIS — B9561 Methicillin susceptible Staphylococcus aureus infection as the cause of diseases classified elsewhere: Secondary | ICD-10-CM | POA: Diagnosis present

## 2023-02-08 DIAGNOSIS — M549 Dorsalgia, unspecified: Secondary | ICD-10-CM | POA: Diagnosis not present

## 2023-02-08 DIAGNOSIS — F1290 Cannabis use, unspecified, uncomplicated: Secondary | ICD-10-CM | POA: Diagnosis not present

## 2023-02-08 DIAGNOSIS — R7881 Bacteremia: Secondary | ICD-10-CM | POA: Insufficient documentation

## 2023-02-08 DIAGNOSIS — F17210 Nicotine dependence, cigarettes, uncomplicated: Secondary | ICD-10-CM | POA: Diagnosis not present

## 2023-02-08 LAB — BCR-ABL1 FISH
Cells Analyzed: 200
Cells Counted: 200

## 2023-02-08 MED ORDER — CEFADROXIL 500 MG PO CAPS: 1000.0000 mg | ORAL_CAPSULE | Freq: Two times a day (BID) | ORAL | 0 refills | Status: DC

## 2023-02-08 NOTE — Progress Notes (Signed)
NAME: Mohmad Scavuzzo  DOB: [birth year]  MRN: [id number]  Date/Time: 02/08/2023 9:55 AM   Subjective:   Joseph [name] is a 42 y.o.male male with severe persistent asthma COPD overlap syndrome, steroid-dependent, on dupulimab, allergic rhinitis, elevated IgE, eczema ,nodular infiltrate lungs in 2019, MRSA rt olecranon bursitis in Feb 2024  ? The patient, recently discharged from the hospital, is currently receiving home cefazolin IV 2 grams Q 8 for a Staph aureus bacterial infection via a PICC line. The patient hashas been administering the antibiotics at home, following a strict procedure to ensure sterility and effectiveness. He has back pain and left hip pain chronic- he had MRI in the hospital and no evidence of infection . Physical therapy has been arranged for the patient at home, but treatment has not yet started.  In addition to bacteremia , he has bronchial asthma  which has been causing some difficulty in breathing. The patient uses albuterol as a rescue inhaler and another inhaler similar to Advair for regular management.   The patient also has a history of high white blood cell count, which is being monitored.by heme/onc    Past Medical History:  Diagnosis Date   Asthma    COPD (chronic obstructive pulmonary disease) (HCC)    Eczema    Hypertension     Past Surgical History:  Procedure Laterality Date   ABDOMINAL AORTOGRAM W/LOWER EXTREMITY Left 01/18/2023   Procedure: ABDOMINAL AORTOGRAM W/LOWER EXTREMITY;  Surgeon: Renford Dills, MD;  Location: [facility] INVASIVE CV LAB;  Service: Cardiovascular;  Laterality: Left;   OLECRANON BURSECTOMY Right 03/16/2022   Procedure: I&D OLECRANON (ELBOW) BURSA;  Surgeon: Signa Kell, MD;  Location: [facility] ORS;  Service: Orthopedics;  Laterality: Right;    Social History   Socioeconomic History   Marital status: Married    Spouse name: Not on file   Number of children: Not on file   Years of education: Not on file   Highest education  level: Not on file  Occupational History   Not on file  Tobacco Use   Smoking status: Every Day    Current packs/day: 0.50    Average packs/day: 0.5 packs/day for 30.0 years (15.0 ttl pk-yrs)    Types: Cigarettes    Start date: 1995   Smokeless tobacco: Never  Vaping Use   Vaping status: Never Used  Substance and Sexual Activity   Alcohol use: No   Drug use: Yes    Types: Marijuana   Sexual activity: Not on file  Other Topics Concern   Not on file  Social History Narrative   Not on file   Social Drivers of Health   Financial Resource Strain: Low Risk  (02/05/2023)   Received from [facility]   Overall Financial Resource Strain (CARDIA)    Difficulty of Paying Living Expenses: Not hard at all  Food Insecurity: No Food Insecurity (02/05/2023)   Received from [facility]   Hunger Vital Sign    Worried About Running Out of Food in the Last Year: Never true    Ran Out of Food in the Last Year: Never true  Transportation Needs: No Transportation Needs (02/05/2023)   Received from [facility] - Transportation    In the past 12 months, has lack of transportation kept you from medical appointments or from getting medications?: No    Lack of Transportation (Non-Medical): No  Physical Activity: Not on file  Stress: Not on file  Social Connections:  Not on file  Intimate Partner Violence: Not At Risk (01/18/2023)   Humiliation, Afraid, Rape, and Kick questionnaire    Fear of Current or Ex-Partner: No    Emotionally Abused: No    Physically Abused: No    Sexually Abused: No    Family History  Problem Relation Age of Onset   Hypertension Mother    Glaucoma Mother    Cancer - Ovarian Sister    Allergies  Allergen Reactions   Penicillins Shortness Of Breath   Pork-Derived Products Nausea Only   Tomato Rash   Banana Hives   Coconut (Cocos Nucifera) Hives   I? Current Outpatient Medications  Medication Sig  Dispense Refill   albuterol (PROVENTIL HFA;VENTOLIN HFA) 108 (90 Base) MCG/ACT inhaler Inhale 2 puffs into the lungs every 6 (six) hours as needed for wheezing or shortness of breath.     albuterol (PROVENTIL) (2.5 MG/3ML) 0.083% nebulizer solution Take 2.5 mg by nebulization every 6 (six) hours as needed for wheezing or shortness of breath.     [START ON 02/15/2023] cefadroxil (DURICEF) 500 MG capsule Take 2 capsules (1,000 mg total) by mouth 2 (two) times daily. 60 capsule 0   ceFAZolin (ANCEF) IVPB Inject 2 g into the vein every 8 (eight) hours for 21 days. Indication:  MSSA bacteremia First Dose: Yes Last Day of Therapy:  02/15/2023 Labs - Once weekly:  CBC/D, CMP, ESR and CRP Fax weekly lab results  promptly to [phone] Please pull PICC at completion of IV antibiotics Method of administration: IV Push Method of administration may be changed at the discretion of home infusion pharmacist based upon assessment of the patient and/or caregiver's ability to self-administer the medication ordered. 63 Units 0   celecoxib (CELEBREX) 200 MG capsule Take 1 capsule (200 mg total) by mouth 2 (two) times daily. 60 capsule 0   DULoxetine (CYMBALTA) 20 MG capsule Take 1 capsule (20 mg total) by mouth daily. 30 capsule 0   dupilumab (DUPIXENT) 300 MG/2ML prefilled syringe Inject 300 mg into the skin every 14 (fourteen) days.     fluticasone-salmeterol (ADVAIR) 250-50 MCG/ACT AEPB Inhale 1 Puff into the lungs every 12 (twelve) hours     furosemide (LASIX) 20 MG tablet Take 20 mg by mouth daily as needed for edema or fluid.     gabapentin (NEURONTIN) 300 MG capsule Take 300 mg by mouth 3 (three) times daily as needed (Neuropathy pain).     hydrochlorothiazide (HYDRODIURIL) 25 MG tablet Take 25 mg by mouth daily.     INCRUSE ELLIPTA 62.5 MCG/ACT AEPB Inhale 1 puff into the lungs daily.     lidocaine (LIDODERM) 5 % Place 1 patch onto the skin daily. Remove & Discard patch within 12 hours or as directed by MD  30 patch 0   losartan (COZAAR) 100 MG tablet Take 1 tablet (100 mg total) by mouth daily. 30 tablet 1   montelukast (SINGULAIR) 10 MG tablet Take 10 mg by mouth at bedtime.     Multiple Vitamin (MULTIVITAMIN WITH MINERALS) TABS tablet Take 1 tablet by mouth daily.     omeprazole (PRILOSEC) 20 MG capsule Take 20 mg by mouth daily as needed (GERD symptoms).     oxyCODONE (OXY IR/ROXICODONE) 5 MG immediate release tablet Take 1-2 tablets (5-10 mg total) by mouth every 6 (six) hours as needed for breakthrough pain. 20 tablet 0   QUEtiapine (SEROQUEL) 25 MG tablet Take 1 tablet (25 mg total) by mouth at bedtime. 30 tablet 0  senna-docusate (SENOKOT-S) 8.6-50 MG tablet Take 1 tablet by mouth at bedtime as needed for mild constipation. 30 tablet 0   tiZANidine (ZANAFLEX) 2 MG tablet Take 2 mg by mouth 3 (three) times daily.     No current facility-administered medications for this visit.     Abtx:  Anti-infectives (From admission, onward)    Start     Dose/Rate Route Frequency Ordered Stop   02/15/23 0000  cefadroxil (DURICEF) 500 MG capsule        1,000 mg Oral 2 times daily 02/08/23 0955         REVIEW OF SYSTEMS:  Const: negative fever, negative chills, negative weight loss Eyes: negative diplopia or visual changes, negative eye pain ENT: negative coryza, negative sore throat Resp:  cough, , dyspnea Cards: negative for chest pain, palpitations, lower extremity edema GU: negative for frequency, dysuria and hematuria GI: Negative for abdominal pain, diarrhea, bleeding, constipation Skin: negative for rash and pruritus Heme: negative for easy bruising and gum/nose bleeding MS:, back pain and muscle weakness Neurolo:negative for headaches, dizziness, vertigo, memory problems  Psych:  anxiety, depression  Endocrine: negative for thyroid, diabetes Allergy/Immunology- as above Objective:  VITALS:  BP 121/88   Pulse (!) 116   Temp (!) 97.2 F (36.2 C) (Temporal)   Ht 5\' 5"  (1.651 m)    Wt 169 lb (76.7 kg)   SpO2 91%   BMI 28.12 kg/m  LDA PICC line PHYSICAL EXAM:  General: Alert, cooperative, no distress, appears stated age.  Head: Normocephalic, without obvious abnormality, atraumatic. Eyes: Conjunctivae clear, anicteric sclerae. Pupils are equal ENT Nares normal. No drainage or sinus tenderness. Lips, mucosa, and tongue normal. No Thrush Neck: Supple, symmetrical, no adenopathy, thyroid: non tender no carotid bruit and no JVD. Back: No CVA tenderness. Lungs: Clear to auscultation bilaterally. No Wheezing or Rhonchi. No rales. Heart: Regular rate and rhythm, no murmur, rub or gallop. Abdomen: Soft, non-tender,not distended. Bowel sounds normal. No masses Extremities: atraumatic, no cyanosis. No edema. No clubbing Skin: No rashes or lesions. Or bruising Lymph: Cervical, supraclavicular normal. Neurologic: Grossly non-focal Pertinent Labs Lab Results CBC    Component Value Date/Time   WBC 27.7 (H) 02/02/2023 1005   RBC 4.40 02/02/2023 1005   HGB 11.5 (L) 02/02/2023 1005   HCT 36.6 (L) 02/02/2023 1005   PLT 551 (H) 02/02/2023 1005   MCV 83.2 02/02/2023 1005   MCH 26.1 02/02/2023 1005   MCHC 31.4 02/02/2023 1005   RDW 18.6 (H) 02/02/2023 1005   LYMPHSABS 1.1 02/02/2023 1005   MONOABS 1.4 (H) 02/02/2023 1005   EOSABS 0.0 02/02/2023 1005   BASOSABS 0.0 02/02/2023 1005       Latest Ref Rng & Units 02/02/2023   10:05 AM 01/24/2023    9:50 AM 01/23/2023    5:39 AM  CMP  Glucose 70 - 99 mg/dL  161  096   BUN 6 - 20 mg/dL  31  22   Creatinine 0.45 - 1.24 mg/dL  4.09  8.11   Sodium 914 - 145 mmol/L  135  135   Potassium 3.5 - 5.1 mmol/L  4.2  3.7   Chloride 98 - 111 mmol/L  95  95   CO2 22 - 32 mmol/L  31  32   Calcium 8.9 - 10.3 mg/dL  8.8  9.1   Total Protein 6.5 - 8.1 g/dL 6.2     Total Bilirubin <1.2 mg/dL 0.4     Alkaline Phos 38 - 126 U/L 63  AST 15 - 41 U/L 19     ALT 0 - 44 U/L 7         ? Impression/Recommendation ?Staphylococcus  aureus bacteremia 2 d echo fine TEE could not be done due to his pulmonary status Patient is on IV antibiotics, self-administered at home, with good technique. The patient is due to finish the IV antibiotics on 02/15/2023. -Start oral antibiotics (Cefadroxil 500mg , 2 pills in the morning and 2 in the evening) from 02/16/2023 to 03/01/2023. -Perform blood culture on 03/15/2023 to ensure no residual infection. -Remove PICC line on 02/16/2023, either at home by a nurse or in the office if necessary.  Bronchial asthma ( / bronchiectasis) .followed by PCP  Leukocytosis The patient's white blood cell count is consistently high, likely due to prednisone use and infection. The patient is under the care of Dr. Cathie Hoops for this issue. -Follow up with Dr. Cathie Hoops on 02/19/2023 for further evaluation and management.  Pain and mobility issues The patient is experiencing pain and difficulty walking. Physical therapy has been arranged but has not yet started. -Ensure physical therapy begins as soon as possible to improve mobility and reduce pain. ? ? ________________________________________________ Discussed with patient, and wife

## 2023-02-08 NOTE — Patient Instructions (Signed)
VISIT SUMMARY:  During your visit, we reviewed your ongoing treatment for a Staph aureus bacterial infection, your bronchiectasis, and your high white blood cell count. We also discussed your pain and mobility issues.  YOUR PLAN:  -STAPHYLOCOCCUS AUREUS INFECTION: You are currently receiving IV antibiotics at home for a bacterial infection. Continue with the IV antibiotics until 02/15/2023. After that, switch to oral antibiotics (Cefadroxil 500mg , 2 pills in the morning and 2 in the evening) from 02/16/2023 to 03/01/2023. A blood culture will be done on 03/15/2023 to ensure the infection is gone. The PICC line will be removed on 02/15/23 by a nurse at home or in the office if needed.  -BRONCHIECTASIS: Bronchiectasis is a condition where small pockets in your lungs accumulate phlegm, making it hard to breathe. Continue using the flutter valve to help clear your airways. Physical therapy may also help with your mobility and lung function.  -LEUKOCYTOSIS: Leukocytosis means you have a high white blood cell count, which is likely due to your prednisone use and infection. Follow up with Dr. Cathie Hoops on 02/19/2023 for further evaluation and management.  -PAIN AND MOBILITY ISSUES: You are experiencing pain and difficulty walking. Physical therapy has been arranged to help improve your mobility and reduce pain. Make sure to start physical therapy as soon as possible.  INSTRUCTIONS:  Please follow up with Dr. Cathie Hoops on 02/19/2023 for your high white blood cell count. A blood culture will be done on 03/15/2023 to ensure no residual infection. The PICC line will be removed on 02/16/2023, either at home by a nurse or in the office if necessary. Start oral antibiotics (Cefadroxil 500mg , 2 pills in the morning and 2 in the evening) from 02/16/2023 to 03/01/2023. Ensure physical therapy begins as soon as possible to improve mobility and reduce pain.

## 2023-02-09 ENCOUNTER — Telehealth: Payer: Self-pay

## 2023-02-09 NOTE — Telephone Encounter (Signed)
Abnormal lab results from 02/06/23:  ESR 29 (ref range < 15) CRP 25.1 (ref range < 8) WBC 22.8 Hgb 11.2 Hct 35.6 Platelet 464 Absolute neutrophils [id number] Absolute monocytes 2987  Sandie Ano, RN

## 2023-02-13 ENCOUNTER — Other Ambulatory Visit: Payer: Self-pay | Admitting: Specialist

## 2023-02-13 ENCOUNTER — Telehealth: Payer: Self-pay

## 2023-02-13 DIAGNOSIS — R911 Solitary pulmonary nodule: Secondary | ICD-10-CM

## 2023-02-13 DIAGNOSIS — J470 Bronchiectasis with acute lower respiratory infection: Secondary | ICD-10-CM

## 2023-02-13 NOTE — Telephone Encounter (Signed)
 Received a call from Joseph [name] with the [facility] team stating she was having difficulty getting a blood return from the picc line and difficulty infusing IV abx.  Verbal orders given per Dr. Fayette to remove picc and do a peripheral blood draw. Patient will start oral abx now. Patient has picked up the cefadroxil . Genaro Bekker ONEIDA Ligas, CMA

## 2023-02-15 LAB — JAK2 V617F RFX CALR/MPL/E12-15

## 2023-02-15 LAB — CALR +MPL + E12-E15  (REFLEX)

## 2023-02-16 LAB — MULTIPLE MYELOMA PANEL, SERUM
Albumin SerPl Elph-Mcnc: 2.9 g/dL (ref 2.9–4.4)
Albumin/Glob SerPl: 1.2 (ref 0.7–1.7)
Alpha 1: 0.3 g/dL (ref 0.0–0.4)
Alpha2 Glob SerPl Elph-Mcnc: 0.7 g/dL (ref 0.4–1.0)
B-Globulin SerPl Elph-Mcnc: 1.1 g/dL (ref 0.7–1.3)
Gamma Glob SerPl Elph-Mcnc: 0.5 g/dL (ref 0.4–1.8)
Globulin, Total: 2.5 g/dL (ref 2.2–3.9)
IgA: 209 mg/dL (ref 90–386)
IgG (Immunoglobin G), Serum: 630 mg/dL (ref 603–1613)
IgM (Immunoglobulin M), Srm: 33 mg/dL (ref 20–172)
Total Protein ELP: 5.4 g/dL — ABNORMAL LOW (ref 6.0–8.5)

## 2023-02-20 ENCOUNTER — Ambulatory Visit
Admission: RE | Admit: 2023-02-20 | Discharge: 2023-02-20 | Disposition: A | Discharge status: Home / Self Care | Payer: Medicare Other | Source: Ambulatory Visit | Attending: Specialist | Admitting: Specialist

## 2023-02-20 DIAGNOSIS — R911 Solitary pulmonary nodule: Secondary | ICD-10-CM | POA: Diagnosis present

## 2023-02-20 DIAGNOSIS — J470 Bronchiectasis with acute lower respiratory infection: Secondary | ICD-10-CM | POA: Insufficient documentation

## 2023-02-27 ENCOUNTER — Encounter: Payer: Self-pay | Admitting: Oncology

## 2023-02-27 ENCOUNTER — Telehealth: Payer: Self-pay

## 2023-02-27 ENCOUNTER — Inpatient Hospital Stay: Payer: Medicare Other | Attending: Oncology | Admitting: Oncology

## 2023-02-27 VITALS — BP 127/89 | HR 132 | Temp 97.9°F | Resp 18 | Wt 162.1 lb

## 2023-02-27 DIAGNOSIS — R630 Anorexia: Secondary | ICD-10-CM | POA: Diagnosis not present

## 2023-02-27 DIAGNOSIS — R7881 Bacteremia: Secondary | ICD-10-CM

## 2023-02-27 DIAGNOSIS — F17210 Nicotine dependence, cigarettes, uncomplicated: Secondary | ICD-10-CM | POA: Insufficient documentation

## 2023-02-27 DIAGNOSIS — D72829 Elevated white blood cell count, unspecified: Secondary | ICD-10-CM

## 2023-02-27 DIAGNOSIS — D75839 Thrombocytosis, unspecified: Secondary | ICD-10-CM

## 2023-02-27 DIAGNOSIS — D72821 Monocytosis (symptomatic): Secondary | ICD-10-CM | POA: Diagnosis present

## 2023-02-27 DIAGNOSIS — R61 Generalized hyperhidrosis: Secondary | ICD-10-CM | POA: Diagnosis not present

## 2023-02-27 DIAGNOSIS — B9561 Methicillin susceptible Staphylococcus aureus infection as the cause of diseases classified elsewhere: Secondary | ICD-10-CM | POA: Diagnosis not present

## 2023-02-27 DIAGNOSIS — Z720 Tobacco use: Secondary | ICD-10-CM

## 2023-02-27 NOTE — Assessment & Plan Note (Signed)
 Pending above work up

## 2023-02-27 NOTE — Assessment & Plan Note (Signed)
 Recommend smoke cessation.

## 2023-02-27 NOTE — Assessment & Plan Note (Signed)
 To finish 4 weeks of cefazolin and follow-up with ID.

## 2023-02-27 NOTE — Assessment & Plan Note (Addendum)
 Chronic leukocytosis, predominantly neutrophilia as well as monocytosis., likely reactive due to chronic steroid use, and recent infection.  Rule out other etiologies. Labs are reviewed and discussed with patient. Leukocytosis predominantly neutrophilia and monocytosis, thrombocytosis. ,  Negative immunophenotypic abnormality on peripheral flow cytometry, he has granulocytosis which could be due to reactive process or myeloid neoplasm. No M protein on  multiple myeloma panel, normal light chain ratio, normal LDH. Negative  BCR-ABL 1 FISH, JAK2 [id number] mutation negative, with reflex to other mutations CALR, MPL, JAK 2 Ex 12-15 mutations negative. Discussed with patient and his wife. Most likely leukocytosis is due to reactive process, chronic steroid use, MSSA infection.  Can not exclude underlying marrow process. Observation is an option.  Wife is very concerned about patient's decreased appetite, night sweat.  Shared decision was made to proceed with bone marrow biopsy in 3 weeks

## 2023-02-27 NOTE — Progress Notes (Signed)
 Pt here for follow up. Reports that he had an asthma flare up and has been taking antibiotics. Today he will take last dose.

## 2023-02-27 NOTE — Progress Notes (Signed)
 Hematology/Oncology Consult Note [phone] [phone]     REFERRING PROVIDER: Cyrus Mayo Hestle, [name]   CHIEF COMPLAINTS/REASON FOR VISIT:  Evaluation of leukocytosis  ASSESSMENT & PLAN:   Leukocytosis Chronic leukocytosis, predominantly neutrophilia as well as monocytosis., likely reactive due to chronic steroid use, and recent infection.  Rule out other etiologies. Labs are reviewed and discussed with patient. Leukocytosis predominantly neutrophilia and monocytosis, thrombocytosis. ,  Negative immunophenotypic abnormality on peripheral flow cytometry, he has granulocytosis which could be due to reactive process or myeloid neoplasm. No M protein on  multiple myeloma panel, normal light chain ratio, normal LDH. Negative  BCR-ABL 1 FISH, JAK2 V617F mutation negative, with reflex to other mutations CALR, MPL, JAK 2 Ex 12-15 mutations negative. Discussed with patient and his wife. Most likely leukocytosis is due to reactive process, chronic steroid use, MSSA infection.  Can not exclude underlying marrow process. Observation is an option.  Wife is very concerned about patient's decreased appetite, night sweat.  Shared decision was made to proceed with bone marrow biopsy in 3 weeks   Thrombocytosis Pending above work up  MSSA bacteremia To finish 4 weeks of cefazolin  and follow-up with ID.  Tobacco use Recommend smoke cessation.   Orders Placed This Encounter  Procedures   IR BONE MARROW BIOPSY & ASPIRATION    Standing Status:   Future    Expected Date:   03/06/2023    Expiration Date:   02/27/2024    Reason for Exam (SYMPTOM  OR DIAGNOSIS REQUIRED):   leokocytosis    Preferred Imaging Location?:   [facility]    Return of visit: 3-4 weeks to discuss results.  Cc [name], Mayo Moose,* All questions were answered. The patient knows to call the clinic with any problems, questions or concerns.  Zelphia Cap, MD, PhD [facility] Hematology  Oncology 02/27/2023    HISTORY OF PRESENTING ILLNESS:  Joseph [name] is a  43 y.o.  male with PMH listed below who was referred to me for evaluation of leukocytosis Reviewed patient' recent labs obtained by PCP.   Previous lab records reviewed. Leukocytosis onset of chronic, duration is since 2019. Patient has chronic asthma/COPD and is on prednisone  long-term. 01/17/2023 - 01/25/2023 patient was hospitalized due to MSSA bacteremia.  CT showed L4-S1 osteo/discitis however MRI ruled out this as a source.  Negative TB.  HIV negative.  TTE echocardiogram unremarkable.  TEE was original plan however canceled due to COVID infection.  Patient was discharged to finish 4 weeks of cefazolin  per ID recommendation. 01/16/2023, protein electrophoresis showed M protein 0.1.  Patient denies unintentional weight loss, fever, chills,night sweats.   Patient smokes daily. Denies autoimmune disease history.    INTERVAL HISTORY Joseph [name] is a 43 y.o. male who has above history reviewed by me today presents for follow up visit for leukocytosis, thrombocytosis   Reports that he had an asthma flare up.  He has been taking antibiotics for MSSA. Today he will take last dose.  Decreased appetite. No weight loss.    MEDICAL HISTORY:  Past Medical History:  Diagnosis Date   Asthma    COPD (chronic obstructive pulmonary disease) (HCC)    Eczema    Hypertension     SURGICAL HISTORY: Past Surgical History:  Procedure Laterality Date   ABDOMINAL AORTOGRAM W/LOWER EXTREMITY Left 01/18/2023   Procedure: ABDOMINAL AORTOGRAM W/LOWER EXTREMITY;  Surgeon: Jama Cordella MATSU, MD;  Location: [facility] INVASIVE CV LAB;  Service: Cardiovascular;  Laterality: Left;   OLECRANON BURSECTOMY Right  03/16/2022   Procedure: I&D OLECRANON (ELBOW) BURSA;  Surgeon: Tobie Priest, MD;  Location: [facility] ORS;  Service: Orthopedics;  Laterality: Right;    SOCIAL HISTORY: Social History   Socioeconomic History   Marital status:  Married    Spouse name: Not on file   Number of children: Not on file   Years of education: Not on file   Highest education level: Not on file  Occupational History   Not on file  Tobacco Use   Smoking status: Every Day    Current packs/day: 0.50    Average packs/day: 0.5 packs/day for 30.0 years (15.0 ttl pk-yrs)    Types: Cigarettes    Start date: 1995   Smokeless tobacco: Never  Vaping Use   Vaping status: Never Used  Substance and Sexual Activity   Alcohol  use: No   Drug use: Yes    Types: Marijuana   Sexual activity: Not on file  Other Topics Concern   Not on file  Social History Narrative   Not on file   Social Drivers of Health   Financial Resource Strain: Low Risk  (02/05/2023)   Received from [facility]   Overall Financial Resource Strain (CARDIA)    Difficulty of Paying Living Expenses: Not hard at all  Food Insecurity: No Food Insecurity (02/05/2023)   Received from [facility]   Hunger Vital Sign    Worried About Running Out of Food in the Last Year: Never true    Ran Out of Food in the Last Year: Never true  Transportation Needs: No Transportation Needs (02/05/2023)   Received from [facility] - Transportation    In the past 12 months, has lack of transportation kept you from medical appointments or from getting medications?: No    Lack of Transportation (Non-Medical): No  Physical Activity: Not on file  Stress: Not on file  Social Connections: Not on file  Intimate Partner Violence: Not At Risk (01/18/2023)   Humiliation, Afraid, Rape, and Kick questionnaire    Fear of Current or Ex-Partner: No    Emotionally Abused: No    Physically Abused: No    Sexually Abused: No    FAMILY HISTORY: Family History  Problem Relation Age of Onset   Hypertension Mother    Glaucoma Mother    Cancer - Ovarian Sister     ALLERGIES:  is allergic to penicillins, pork-derived products, tomato, banana,  and coconut (cocos nucifera).  MEDICATIONS:  Current Outpatient Medications  Medication Sig Dispense Refill   albuterol  (PROVENTIL  HFA;VENTOLIN  HFA) 108 (90 Base) MCG/ACT inhaler Inhale 2 puffs into the lungs every 6 (six) hours as needed for wheezing or shortness of breath.     albuterol  (PROVENTIL ) (2.5 MG/3ML) 0.083% nebulizer solution Take 2.5 mg by nebulization every 6 (six) hours as needed for wheezing or shortness of breath.     cefadroxil  (DURICEF) 500 MG capsule Take 2 capsules (1,000 mg total) by mouth 2 (two) times daily. 60 capsule 0   celecoxib  (CELEBREX ) 200 MG capsule Take 1 capsule (200 mg total) by mouth 2 (two) times daily. 60 capsule 0   DULoxetine  (CYMBALTA ) 20 MG capsule Take 1 capsule (20 mg total) by mouth daily. 30 capsule 0   fluticasone -salmeterol (ADVAIR) 250-50 MCG/ACT AEPB Inhale 1 Puff into the lungs every 12 (twelve) hours     furosemide  (LASIX ) 20 MG tablet Take 20 mg by mouth daily as needed for edema or fluid.  gabapentin  (NEURONTIN ) 300 MG capsule Take 300 mg by mouth 3 (three) times daily as needed (Neuropathy pain).     hydrochlorothiazide  (HYDRODIURIL ) 25 MG tablet Take 25 mg by mouth daily.     INCRUSE ELLIPTA  62.5 MCG/ACT AEPB Inhale 1 puff into the lungs daily.     losartan  (COZAAR ) 100 MG tablet Take 1 tablet (100 mg total) by mouth daily. 30 tablet 1   montelukast  (SINGULAIR ) 10 MG tablet Take 10 mg by mouth at bedtime.     Multiple Vitamin (MULTIVITAMIN WITH MINERALS) TABS tablet Take 1 tablet by mouth daily.     omeprazole (PRILOSEC) 20 MG capsule Take 20 mg by mouth daily as needed (GERD symptoms).     predniSONE  (DELTASONE ) 10 MG tablet Take 10 mg by mouth daily with breakfast.     dupilumab (DUPIXENT) 300 MG/2ML prefilled syringe Inject 300 mg into the skin every 14 (fourteen) days. (Patient not taking: Reported on 02/27/2023)     lidocaine  (LIDODERM ) 5 % Place 1 patch onto the skin daily. Remove & Discard patch within 12 hours or as directed by  MD (Patient not taking: Reported on 02/27/2023) 30 patch 0   oxyCODONE  (OXY IR/ROXICODONE ) 5 MG immediate release tablet Take 1-2 tablets (5-10 mg total) by mouth every 6 (six) hours as needed for breakthrough pain. (Patient not taking: Reported on 02/27/2023) 20 tablet 0   QUEtiapine  (SEROQUEL ) 25 MG tablet Take 1 tablet (25 mg total) by mouth at bedtime. (Patient not taking: Reported on 02/27/2023) 30 tablet 0   senna-docusate (SENOKOT-S) 8.6-50 MG tablet Take 1 tablet by mouth at bedtime as needed for mild constipation. (Patient not taking: Reported on 02/27/2023) 30 tablet 0   tiZANidine  (ZANAFLEX ) 2 MG tablet Take 2 mg by mouth 3 (three) times daily. (Patient not taking: Reported on 02/27/2023)     No current facility-administered medications for this visit.    Review of Systems  Constitutional:  Positive for appetite change, fatigue and unexpected weight change. Negative for chills and fever.  HENT:   Negative for hearing loss and voice change.   Eyes:  Negative for eye problems and icterus.  Respiratory:  Positive for wheezing. Negative for chest tightness, cough and shortness of breath.   Cardiovascular:  Negative for chest pain and leg swelling.  Gastrointestinal:  Negative for abdominal distention and abdominal pain.  Endocrine: Negative for hot flashes.  Genitourinary:  Negative for difficulty urinating, dysuria and frequency.   Musculoskeletal:  Positive for back pain. Negative for arthralgias.  Skin:  Negative for itching and rash.  Neurological:  Negative for light-headedness and numbness.  Hematological:  Negative for adenopathy. Does not bruise/bleed easily.  Psychiatric/Behavioral:  Negative for confusion.     PHYSICAL EXAMINATION: ECOG PERFORMANCE STATUS: 1 - Symptomatic but completely ambulatory Vitals:   02/27/23 1027  BP: 127/89  Pulse: (!) 132  Resp: 18  Temp: 97.9 F (36.6 C)  SpO2: 93%   Filed Weights   02/27/23 1027  Weight: 162 lb 1.6 oz (73.5 kg)     Physical Exam Constitutional:      General: He is not in acute distress.    Appearance: He is obese.  HENT:     Head: Normocephalic and atraumatic.  Eyes:     General: No scleral icterus. Cardiovascular:     Rate and Rhythm: Normal rate and regular rhythm.     Heart sounds: Normal heart sounds.  Pulmonary:     Effort: Pulmonary effort is normal. No respiratory distress.  Breath sounds: No wheezing.  Abdominal:     General: Bowel sounds are normal. There is no distension.     Palpations: Abdomen is soft.  Musculoskeletal:        General: No deformity. Normal range of motion.     Cervical back: Normal range of motion and neck supple.  Skin:    General: Skin is warm and dry.     Findings: No erythema or rash.  Neurological:     Mental Status: He is alert and oriented to person, place, and time. Mental status is at baseline.     Cranial Nerves: No cranial nerve deficit.  Psychiatric:        Mood and Affect: Mood normal.        RADIOGRAPHIC STUDIES: I have personally reviewed the radiological images as listed and agreed with the findings in the report. No results found.   LABORATORY DATA:  I have reviewed the data as listed    Latest Ref Rng & Units 02/02/2023   10:05 AM 01/24/2023    9:50 AM 01/23/2023    5:39 AM  CBC  WBC 4.0 - 10.5 K/uL 27.7  27.9  24.4   Hemoglobin 13.0 - 17.0 g/dL 88.4  88.8  87.9   Hematocrit 39.0 - 52.0 % 36.6  34.4  37.8   Platelets 150 - 400 K/uL 551  549  462       Latest Ref Rng & Units 02/02/2023   10:05 AM 01/24/2023    9:50 AM 01/23/2023    5:39 AM  CMP  Glucose 70 - 99 mg/dL  865  894   BUN 6 - 20 mg/dL  31  22   Creatinine 9.38 - 1.24 mg/dL  9.05  9.25   Sodium 864 - 145 mmol/L  135  135   Potassium 3.5 - 5.1 mmol/L  4.2  3.7   Chloride 98 - 111 mmol/L  95  95   CO2 22 - 32 mmol/L  31  32   Calcium 8.9 - 10.3 mg/dL  8.8  9.1   Total Protein 6.5 - 8.1 g/dL 6.2     Total Bilirubin <1.2 mg/dL 0.4     Alkaline Phos  38 - 126 U/L 63     AST 15 - 41 U/L 19     ALT 0 - 44 U/L 7

## 2023-02-27 NOTE — Telephone Encounter (Signed)
 Pt scheduled for bm bx on 2/6 @ 8:30a, arrive 7:30. Pt aware of appt.   Please schedule MD ( for bx resutls) on 2/18 @10 :15. pt aware of appt and will see on Mychart as well

## 2023-02-27 NOTE — Telephone Encounter (Signed)
 PER LOS on 1/14:   Bone marrow biopsy in 3 weeks - leukocytosis.  Prior to MD 2 weeks after biopsy.   Request for bone marrow bx sent to IR.

## 2023-03-19 ENCOUNTER — Telehealth: Payer: Self-pay

## 2023-03-19 NOTE — Telephone Encounter (Signed)
Recevied messag from [place] in IR statting that pt called to cancel biopsy on 2/6. Pt contacted pt and he does not want to r/s bx, but wants to keep MD appt on 2/18 for further discussion of labs.

## 2023-03-22 ENCOUNTER — Ambulatory Visit: Payer: Medicare Other | Admitting: Radiology

## 2023-03-26 ENCOUNTER — Ambulatory Visit: Payer: Medicare Other | Admitting: Physical Therapy

## 2023-03-29 ENCOUNTER — Ambulatory Visit: Payer: Medicare Other | Admitting: Physical Therapy

## 2023-04-02 ENCOUNTER — Ambulatory Visit: Payer: Medicare Other | Admitting: Physical Therapy
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
## 2023-04-03 ENCOUNTER — Inpatient Hospital Stay: Payer: Medicare Other | Attending: Oncology | Admitting: Oncology

## 2023-04-03 ENCOUNTER — Inpatient Hospital Stay: Payer: Medicare Other | Admitting: Oncology

## 2023-04-03 ENCOUNTER — Ambulatory Visit: Payer: Medicare Other | Attending: Neurology

## 2023-04-03 ENCOUNTER — Encounter: Payer: Self-pay | Admitting: Oncology

## 2023-04-03 VITALS — BP 124/93 | HR 110 | Temp 97.8°F | Resp 18 | Wt 166.6 lb

## 2023-04-03 DIAGNOSIS — D72829 Elevated white blood cell count, unspecified: Secondary | ICD-10-CM | POA: Diagnosis not present

## 2023-04-03 DIAGNOSIS — D72821 Monocytosis (symptomatic): Secondary | ICD-10-CM | POA: Diagnosis present

## 2023-04-03 DIAGNOSIS — F17210 Nicotine dependence, cigarettes, uncomplicated: Secondary | ICD-10-CM | POA: Diagnosis not present

## 2023-04-03 DIAGNOSIS — R262 Difficulty in walking, not elsewhere classified: Secondary | ICD-10-CM | POA: Insufficient documentation

## 2023-04-03 DIAGNOSIS — R278 Other lack of coordination: Secondary | ICD-10-CM | POA: Insufficient documentation

## 2023-04-03 DIAGNOSIS — Z7952 Long term (current) use of systemic steroids: Secondary | ICD-10-CM | POA: Diagnosis not present

## 2023-04-03 DIAGNOSIS — R269 Unspecified abnormalities of gait and mobility: Secondary | ICD-10-CM | POA: Diagnosis present

## 2023-04-03 DIAGNOSIS — F1290 Cannabis use, unspecified, uncomplicated: Secondary | ICD-10-CM | POA: Diagnosis not present

## 2023-04-03 DIAGNOSIS — R2689 Other abnormalities of gait and mobility: Secondary | ICD-10-CM | POA: Diagnosis present

## 2023-04-03 DIAGNOSIS — M5459 Other low back pain: Secondary | ICD-10-CM | POA: Diagnosis present

## 2023-04-03 DIAGNOSIS — J4489 Other specified chronic obstructive pulmonary disease: Secondary | ICD-10-CM | POA: Diagnosis not present

## 2023-04-03 DIAGNOSIS — M6281 Muscle weakness (generalized): Secondary | ICD-10-CM | POA: Diagnosis present

## 2023-04-03 DIAGNOSIS — Z8616 Personal history of COVID-19: Secondary | ICD-10-CM | POA: Insufficient documentation

## 2023-04-03 DIAGNOSIS — M79605 Pain in left leg: Secondary | ICD-10-CM | POA: Insufficient documentation

## 2023-04-03 DIAGNOSIS — D75839 Thrombocytosis, unspecified: Secondary | ICD-10-CM | POA: Insufficient documentation

## 2023-04-03 NOTE — Assessment & Plan Note (Signed)
 See above plan.

## 2023-04-03 NOTE — Progress Notes (Signed)
Hematology/Oncology Consult Note [phone] [phone]     REFERRING PROVIDER: Debbra Riding Hestle, [name]   CHIEF COMPLAINTS/REASON FOR VISIT:  Evaluation of leukocytosis  ASSESSMENT & PLAN:   Leukocytosis Chronic leukocytosis, predominantly neutrophilia as well as monocytosis., likely reactive due to chronic steroid use, and recent infection.  Rule out other etiologies. Labs are reviewed and discussed with patient. Leukocytosis predominantly neutrophilia and monocytosis, thrombocytosis. ,  Negative immunophenotypic abnormality on peripheral flow cytometry, he has granulocytosis which could be due to reactive process or myeloid neoplasm. No M protein on  multiple myeloma panel, normal light chain ratio, normal LDH. Negative  BCR-ABL 1 FISH, JAK2 V617F mutation negative, with reflex to other mutations CALR, MPL, JAK 2 Ex 12-15 mutations negative. Discussed with patient and his wife. Most likely leukocytosis is due to reactive process, chronic steroid use, MSSA infection. Can not exclude underlying marrow process. Observation is an option.  During last visit, wife is very concerned about patient's decreased appetite, night sweat.  So decision was made to proceed with bone marrow biopsy.  Since then, patient has improved appetite and has gained weight. Recent CBC showed that leukocytosis has trended down slightly as well.  I think reasonable to continue observe.  He is otherwise asymptomatic.  Recommend patient follow-up in 6 months.  Thrombocytosis See above plan.   Orders Placed This Encounter  Procedures   CBC with Differential (Cancer Center Only)    Standing Status:   Future    Expected Date:   10/01/2023    Expiration Date:   04/02/2024   CMP (Cancer Center only)    Standing Status:   Future    Expected Date:   10/01/2023    Expiration Date:   04/02/2024    Return of visit:  C[facility] [place],* All questions were answered. The patient knows to  call the clinic with any problems, questions or concerns.  Rickard Patience, MD, PhD [facility] Hematology Oncology 04/03/2023    HISTORY OF PRESENTING ILLNESS:  Joseph [name] is a  43 y.o.  male with PMH listed below who was referred to me for evaluation of leukocytosis Reviewed patient' recent labs obtained by PCP.   Previous lab records reviewed. Leukocytosis onset of chronic, duration is since 2019. Patient has chronic asthma/COPD and is on prednisone long-term. 01/17/2023 - 01/25/2023 patient was hospitalized due to MSSA bacteremia.  CT showed L4-S1 osteo/discitis however MRI ruled out this as a source.  Negative TB.  HIV negative.  TTE echocardiogram unremarkable.  TEE was original plan however canceled due to COVID infection.  Patient was discharged to finish 4 weeks of cefazolin per ID recommendation. 01/16/2023, protein electrophoresis showed M protein 0.1.  Patient denies unintentional weight loss, fever, chills,night sweats.   Patient smokes daily. Denies autoimmune disease history.    INTERVAL HISTORY Joseph [name] is a 43 y.o. male who has above history reviewed by me today presents for follow up visit for leukocytosis, thrombocytosis  Today patient reports feeling well.  Appetite has improved.  Weight is stable. Patient has decided to hold off bone marrow biopsy.  No new complaints.    MEDICAL HISTORY:  Past Medical History:  Diagnosis Date   Asthma    COPD (chronic obstructive pulmonary disease) (HCC)    Eczema    Hypertension     SURGICAL HISTORY: Past Surgical History:  Procedure Laterality Date   ABDOMINAL AORTOGRAM W/LOWER EXTREMITY Left 01/18/2023   Procedure: ABDOMINAL AORTOGRAM W/LOWER EXTREMITY;  Surgeon: Renford Dills, MD;  Location: [facility] INVASIVE CV LAB;  Service: Cardiovascular;  Laterality: Left;   OLECRANON BURSECTOMY Right 03/16/2022   Procedure: I&D OLECRANON (ELBOW) BURSA;  Surgeon: Signa Kell, MD;  Location: [facility] ORS;  Service: Orthopedics;   Laterality: Right;    SOCIAL HISTORY: Social History   Socioeconomic History   Marital status: Married    Spouse name: Not on file   Number of children: Not on file   Years of education: Not on file   Highest education level: Not on file  Occupational History   Not on file  Tobacco Use   Smoking status: Every Day    Current packs/day: 0.50    Average packs/day: 0.5 packs/day for 30.1 years (15.1 ttl pk-yrs)    Types: Cigarettes    Start date: 1995   Smokeless tobacco: Never  Vaping Use   Vaping status: Never Used  Substance and Sexual Activity   Alcohol use: No   Drug use: Yes    Types: Marijuana   Sexual activity: Not on file  Other Topics Concern   Not on file  Social History Narrative   Not on file   Social Drivers of Health   Financial Resource Strain: Low Risk  (03/24/2023)   Received from [facility]   Overall Financial Resource Strain (CARDIA)    Difficulty of Paying Living Expenses: Not hard at all  Recent Concern: Financial Resource Strain - Medium Risk (03/16/2023)   Received from [facility]   Overall Financial Resource Strain (CARDIA)    Difficulty of Paying Living Expenses: Somewhat hard  Food Insecurity: No Food Insecurity (03/24/2023)   Received from [facility]   Hunger Vital Sign    Worried About Running Out of Food in the Last Year: Never true    Ran Out of Food in the Last Year: Never true  Transportation Needs: No Transportation Needs (03/24/2023)   Received from [facility] - Transportation    In the past 12 months, has lack of transportation kept you from medical appointments or from getting medications?: No    Lack of Transportation (Non-Medical): No  Physical Activity: Not on file  Stress: Not on file  Social Connections: Not on file  Intimate Partner Violence: Not At Risk (01/18/2023)   Humiliation, Afraid, Rape, and Kick questionnaire    Fear of Current or  Ex-Partner: No    Emotionally Abused: No    Physically Abused: No    Sexually Abused: No    FAMILY HISTORY: Family History  Problem Relation Age of Onset   Hypertension Mother    Glaucoma Mother    Cancer - Ovarian Sister     ALLERGIES:  is allergic to penicillins, pork-derived products, tomato, banana, and coconut (cocos nucifera).  MEDICATIONS:  Current Outpatient Medications  Medication Sig Dispense Refill   albuterol (PROVENTIL HFA;VENTOLIN HFA) 108 (90 Base) MCG/ACT inhaler Inhale 2 puffs into the lungs every 6 (six) hours as needed for wheezing or shortness of breath.     albuterol (PROVENTIL) (2.5 MG/3ML) 0.083% nebulizer solution Take 2.5 mg by nebulization every 6 (six) hours as needed for wheezing or shortness of breath.     cefadroxil (DURICEF) 500 MG capsule Take 2 capsules (1,000 mg total) by mouth 2 (two) times daily. 60 capsule 0   celecoxib (CELEBREX) 200 MG capsule Take 1 capsule (200 mg total) by mouth 2 (two) times daily. 60 capsule 0   dupilumab (DUPIXENT) 300 MG/2ML prefilled syringe Inject  300 mg into the skin every 14 (fourteen) days.     fluticasone-salmeterol (ADVAIR) 250-50 MCG/ACT AEPB Inhale 1 Puff into the lungs every 12 (twelve) hours     furosemide (LASIX) 20 MG tablet Take 20 mg by mouth daily as needed for edema or fluid.     hydrochlorothiazide (HYDRODIURIL) 25 MG tablet Take 25 mg by mouth daily.     INCRUSE ELLIPTA 62.5 MCG/ACT AEPB Inhale 1 puff into the lungs daily.     losartan (COZAAR) 100 MG tablet Take 1 tablet (100 mg total) by mouth daily. 30 tablet 1   montelukast (SINGULAIR) 10 MG tablet Take 10 mg by mouth at bedtime.     Multiple Vitamin (MULTIVITAMIN WITH MINERALS) TABS tablet Take 1 tablet by mouth daily.     omeprazole (PRILOSEC) 20 MG capsule Take 20 mg by mouth daily as needed (GERD symptoms).     predniSONE (DELTASONE) 10 MG tablet Take 10 mg by mouth daily with breakfast.     DULoxetine (CYMBALTA) 20 MG capsule Take 1 capsule (20  mg total) by mouth daily. (Patient not taking: Reported on 04/03/2023) 30 capsule 0   gabapentin (NEURONTIN) 300 MG capsule Take 300 mg by mouth 3 (three) times daily as needed (Neuropathy pain). (Patient not taking: Reported on 04/03/2023)     lidocaine (LIDODERM) 5 % Place 1 patch onto the skin daily. Remove & Discard patch within 12 hours or as directed by MD (Patient not taking: Reported on 04/03/2023) 30 patch 0   oxyCODONE (OXY IR/ROXICODONE) 5 MG immediate release tablet Take 1-2 tablets (5-10 mg total) by mouth every 6 (six) hours as needed for breakthrough pain. (Patient not taking: Reported on 04/03/2023) 20 tablet 0   QUEtiapine (SEROQUEL) 25 MG tablet Take 1 tablet (25 mg total) by mouth at bedtime. (Patient not taking: Reported on 04/03/2023) 30 tablet 0   senna-docusate (SENOKOT-S) 8.6-50 MG tablet Take 1 tablet by mouth at bedtime as needed for mild constipation. (Patient not taking: Reported on 04/03/2023) 30 tablet 0   tiZANidine (ZANAFLEX) 2 MG tablet Take 2 mg by mouth 3 (three) times daily. (Patient not taking: Reported on 02/27/2023)     No current facility-administered medications for this visit.    Review of Systems  Constitutional:  Positive for fatigue. Negative for appetite change, chills, fever and unexpected weight change.  HENT:   Negative for hearing loss and voice change.   Eyes:  Negative for eye problems and icterus.  Respiratory:  Positive for wheezing. Negative for chest tightness, cough and shortness of breath.   Cardiovascular:  Negative for chest pain and leg swelling.  Gastrointestinal:  Negative for abdominal distention and abdominal pain.  Endocrine: Negative for hot flashes.  Genitourinary:  Negative for difficulty urinating, dysuria and frequency.   Musculoskeletal:  Positive for back pain. Negative for arthralgias.  Skin:  Negative for itching and rash.  Neurological:  Negative for light-headedness and numbness.  Hematological:  Negative for adenopathy.  Does not bruise/bleed easily.  Psychiatric/Behavioral:  Negative for confusion.     PHYSICAL EXAMINATION: ECOG PERFORMANCE STATUS: 1 - Symptomatic but completely ambulatory Vitals:   04/03/23 1342  BP: (!) 124/93  Pulse: (!) 110  Resp: 18  Temp: 97.8 F (36.6 C)  SpO2: 96%   Filed Weights   04/03/23 1342  Weight: 166 lb 9.6 oz (75.6 kg)    Physical Exam Constitutional:      General: He is not in acute distress.    Appearance: He is  obese.  HENT:     Head: Normocephalic and atraumatic.  Eyes:     General: No scleral icterus. Cardiovascular:     Rate and Rhythm: Normal rate and regular rhythm.     Heart sounds: Normal heart sounds.  Pulmonary:     Effort: Pulmonary effort is normal. No respiratory distress.     Breath sounds: Wheezing present.  Abdominal:     General: Bowel sounds are normal. There is no distension.     Palpations: Abdomen is soft.  Musculoskeletal:        General: No deformity. Normal range of motion.     Cervical back: Normal range of motion and neck supple.  Skin:    General: Skin is warm and dry.     Findings: No erythema or rash.  Neurological:     Mental Status: He is alert and oriented to person, place, and time. Mental status is at baseline.     Cranial Nerves: No cranial nerve deficit.  Psychiatric:        Mood and Affect: Mood normal.        RADIOGRAPHIC STUDIES: I have personally reviewed the radiological images as listed and agreed with the findings in the report. No results found.   LABORATORY DATA:  I have reviewed the data as listed    Latest Ref Rng & Units 02/02/2023   10:05 AM 01/24/2023    9:50 AM 01/23/2023    5:39 AM  CBC  WBC 4.0 - 10.5 K/uL 27.7  27.9  24.4   Hemoglobin 13.0 - 17.0 g/dL 16.1  09.6  04.5   Hematocrit 39.0 - 52.0 % 36.6  34.4  37.8   Platelets 150 - 400 K/uL 551  549  462       Latest Ref Rng & Units 02/02/2023   10:05 AM 01/24/2023    9:50 AM 01/23/2023    5:39 AM  CMP  Glucose 70 - 99  mg/dL  409  811   BUN 6 - 20 mg/dL  31  22   Creatinine 9.14 - 1.24 mg/dL  7.82  9.56   Sodium 213 - 145 mmol/L  135  135   Potassium 3.5 - 5.1 mmol/L  4.2  3.7   Chloride 98 - 111 mmol/L  95  95   CO2 22 - 32 mmol/L  31  32   Calcium 8.9 - 10.3 mg/dL  8.8  9.1   Total Protein 6.5 - 8.1 g/dL 6.2     Total Bilirubin <1.2 mg/dL 0.4     Alkaline Phos 38 - 126 U/L 63     AST 15 - 41 U/L 19     ALT 0 - 44 U/L 7

## 2023-04-03 NOTE — Assessment & Plan Note (Addendum)
Chronic leukocytosis, predominantly neutrophilia as well as monocytosis., likely reactive due to chronic steroid use, and recent infection.  Rule out other etiologies. Labs are reviewed and discussed with patient. Leukocytosis predominantly neutrophilia and monocytosis, thrombocytosis. ,  Negative immunophenotypic abnormality on peripheral flow cytometry, he has granulocytosis which could be due to reactive process or myeloid neoplasm. No M protein on  multiple myeloma panel, normal light chain ratio, normal LDH. Negative  BCR-ABL 1 FISH, JAK2 V617F mutation negative, with reflex to other mutations CALR, MPL, JAK 2 Ex 12-15 mutations negative. Discussed with patient and his wife. Most likely leukocytosis is due to reactive process, chronic steroid use, MSSA infection. Can not exclude underlying marrow process. Observation is an option.  During last visit, wife is very concerned about patient's decreased appetite, night sweat.  So decision was made to proceed with bone marrow biopsy.  Since then, patient has improved appetite and has gained weight. Recent CBC showed that leukocytosis has trended down slightly as well.  I think reasonable to continue observe.  He is otherwise asymptomatic.  Recommend patient follow-up in 6 months.

## 2023-04-05 ENCOUNTER — Ambulatory Visit: Payer: Medicare Other | Admitting: Physical Therapy
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
## 2023-04-09 ENCOUNTER — Ambulatory Visit: Payer: Medicare Other | Admitting: Physical Therapy

## 2023-04-09 ENCOUNTER — Ambulatory Visit: Payer: Medicare Other
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
## 2023-04-10 ENCOUNTER — Ambulatory Visit: Payer: Medicare Other

## 2023-04-10 DIAGNOSIS — R269 Unspecified abnormalities of gait and mobility: Secondary | ICD-10-CM

## 2023-04-10 DIAGNOSIS — R278 Other lack of coordination: Secondary | ICD-10-CM

## 2023-04-10 DIAGNOSIS — M5459 Other low back pain: Secondary | ICD-10-CM

## 2023-04-10 DIAGNOSIS — R2689 Other abnormalities of gait and mobility: Secondary | ICD-10-CM

## 2023-04-10 DIAGNOSIS — M6281 Muscle weakness (generalized): Secondary | ICD-10-CM

## 2023-04-10 DIAGNOSIS — R262 Difficulty in walking, not elsewhere classified: Secondary | ICD-10-CM

## 2023-04-10 DIAGNOSIS — M79605 Pain in left leg: Secondary | ICD-10-CM

## 2023-04-11 ENCOUNTER — Ambulatory Visit: Payer: Medicare Other | Attending: Otolaryngology

## 2023-04-11 ENCOUNTER — Ambulatory Visit: Payer: Medicare Other

## 2023-04-11 DIAGNOSIS — E119 Type 2 diabetes mellitus without complications: Secondary | ICD-10-CM | POA: Diagnosis not present

## 2023-04-11 DIAGNOSIS — G4733 Obstructive sleep apnea (adult) (pediatric): Secondary | ICD-10-CM | POA: Insufficient documentation

## 2023-04-11 DIAGNOSIS — G4761 Periodic limb movement disorder: Secondary | ICD-10-CM | POA: Diagnosis not present

## 2023-04-11 DIAGNOSIS — R000 Tachycardia, unspecified: Secondary | ICD-10-CM | POA: Insufficient documentation

## 2023-04-11 DIAGNOSIS — I10 Essential (primary) hypertension: Secondary | ICD-10-CM | POA: Diagnosis not present

## 2023-04-11 DIAGNOSIS — G4736 Sleep related hypoventilation in conditions classified elsewhere: Secondary | ICD-10-CM | POA: Diagnosis not present

## 2023-04-11 DIAGNOSIS — K219 Gastro-esophageal reflux disease without esophagitis: Secondary | ICD-10-CM | POA: Insufficient documentation

## 2023-04-12 ENCOUNTER — Ambulatory Visit: Payer: Medicare Other | Admitting: Physical Therapy

## 2023-04-12 ENCOUNTER — Ambulatory Visit: Payer: Medicare Other

## 2023-04-12 DIAGNOSIS — R2689 Other abnormalities of gait and mobility: Secondary | ICD-10-CM

## 2023-04-12 DIAGNOSIS — R269 Unspecified abnormalities of gait and mobility: Secondary | ICD-10-CM

## 2023-04-12 DIAGNOSIS — R262 Difficulty in walking, not elsewhere classified: Secondary | ICD-10-CM

## 2023-04-12 DIAGNOSIS — R278 Other lack of coordination: Secondary | ICD-10-CM

## 2023-04-12 DIAGNOSIS — M79605 Pain in left leg: Secondary | ICD-10-CM

## 2023-04-12 DIAGNOSIS — M6281 Muscle weakness (generalized): Secondary | ICD-10-CM

## 2023-04-12 DIAGNOSIS — M5459 Other low back pain: Secondary | ICD-10-CM
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
## 2023-04-16 ENCOUNTER — Ambulatory Visit: Payer: Medicare Other | Admitting: Physical Therapy

## 2023-04-17 ENCOUNTER — Ambulatory Visit: Payer: Medicare Other | Attending: Neurology

## 2023-04-17 DIAGNOSIS — M6281 Muscle weakness (generalized): Secondary | ICD-10-CM | POA: Insufficient documentation

## 2023-04-17 DIAGNOSIS — M79605 Pain in left leg: Secondary | ICD-10-CM | POA: Insufficient documentation

## 2023-04-17 DIAGNOSIS — R278 Other lack of coordination: Secondary | ICD-10-CM | POA: Insufficient documentation

## 2023-04-17 DIAGNOSIS — R269 Unspecified abnormalities of gait and mobility: Secondary | ICD-10-CM | POA: Diagnosis present

## 2023-04-17 DIAGNOSIS — R2689 Other abnormalities of gait and mobility: Secondary | ICD-10-CM | POA: Diagnosis present

## 2023-04-17 DIAGNOSIS — R262 Difficulty in walking, not elsewhere classified: Secondary | ICD-10-CM | POA: Insufficient documentation
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
## 2023-04-19 ENCOUNTER — Ambulatory Visit: Payer: Medicare Other | Admitting: Physical Therapy

## 2023-04-19 ENCOUNTER — Ambulatory Visit: Payer: Medicare Other

## 2023-04-23 ENCOUNTER — Ambulatory Visit: Payer: Medicare Other | Admitting: Physical Therapy

## 2023-04-24 ENCOUNTER — Ambulatory Visit: Payer: Medicare Other | Admitting: Physical Therapy

## 2023-04-24 DIAGNOSIS — M6281 Muscle weakness (generalized): Secondary | ICD-10-CM

## 2023-04-24 DIAGNOSIS — R269 Unspecified abnormalities of gait and mobility: Secondary | ICD-10-CM

## 2023-04-24 DIAGNOSIS — R262 Difficulty in walking, not elsewhere classified: Secondary | ICD-10-CM

## 2023-04-24 DIAGNOSIS — R2689 Other abnormalities of gait and mobility: Secondary | ICD-10-CM

## 2023-04-24 DIAGNOSIS — R278 Other lack of coordination: Secondary | ICD-10-CM
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
## 2023-04-26 ENCOUNTER — Ambulatory Visit: Payer: Medicare Other

## 2023-04-26 ENCOUNTER — Ambulatory Visit: Payer: Medicare Other | Admitting: Physical Therapy

## 2023-04-30 ENCOUNTER — Ambulatory Visit: Payer: Medicare Other | Admitting: Physical Therapy

## 2023-05-01 ENCOUNTER — Ambulatory Visit: Payer: Medicare Other | Admitting: Physical Therapy

## 2023-05-02 ENCOUNTER — Ambulatory Visit: Payer: Medicare Other | Admitting: Physical Therapy

## 2023-05-03 ENCOUNTER — Ambulatory Visit: Payer: Medicare Other

## 2023-05-07 ENCOUNTER — Ambulatory Visit: Payer: Medicare Other | Admitting: Physical Therapy

## 2023-05-08 ENCOUNTER — Ambulatory Visit: Payer: Medicare Other

## 2023-05-09 ENCOUNTER — Ambulatory Visit: Payer: Medicare Other | Admitting: Physical Therapy

## 2023-05-14 ENCOUNTER — Ambulatory Visit: Payer: Medicare Other | Admitting: Physical Therapy

## 2023-05-16 ENCOUNTER — Ambulatory Visit: Payer: Medicare Other | Admitting: Physical Therapy

## 2023-05-21 ENCOUNTER — Ambulatory Visit

## 2023-05-21 ENCOUNTER — Ambulatory Visit: Payer: Medicare Other | Admitting: Physical Therapy

## 2023-05-22 ENCOUNTER — Ambulatory Visit: Payer: Medicare Other | Admitting: Physical Therapy

## 2023-05-23 ENCOUNTER — Ambulatory Visit: Payer: Medicare Other | Admitting: Physical Therapy

## 2023-05-24 ENCOUNTER — Ambulatory Visit: Payer: Medicare Other | Attending: Neurology

## 2023-05-24 DIAGNOSIS — R262 Difficulty in walking, not elsewhere classified: Secondary | ICD-10-CM | POA: Diagnosis present

## 2023-05-24 DIAGNOSIS — R278 Other lack of coordination: Secondary | ICD-10-CM

## 2023-05-24 DIAGNOSIS — R269 Unspecified abnormalities of gait and mobility: Secondary | ICD-10-CM | POA: Diagnosis present

## 2023-05-24 DIAGNOSIS — M5459 Other low back pain: Secondary | ICD-10-CM | POA: Insufficient documentation

## 2023-05-24 DIAGNOSIS — M6281 Muscle weakness (generalized): Secondary | ICD-10-CM | POA: Diagnosis present

## 2023-05-24 DIAGNOSIS — M79605 Pain in left leg: Secondary | ICD-10-CM | POA: Diagnosis present

## 2023-05-24 DIAGNOSIS — R2689 Other abnormalities of gait and mobility: Secondary | ICD-10-CM | POA: Diagnosis present
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
## 2023-05-28 ENCOUNTER — Ambulatory Visit: Payer: Medicare Other | Admitting: Physical Therapy

## 2023-05-29 ENCOUNTER — Ambulatory Visit: Payer: Medicare Other | Admitting: Physical Therapy

## 2023-05-29 DIAGNOSIS — R2689 Other abnormalities of gait and mobility: Secondary | ICD-10-CM

## 2023-05-29 DIAGNOSIS — R278 Other lack of coordination: Secondary | ICD-10-CM

## 2023-05-29 DIAGNOSIS — M6281 Muscle weakness (generalized): Secondary | ICD-10-CM

## 2023-05-29 DIAGNOSIS — R269 Unspecified abnormalities of gait and mobility: Secondary | ICD-10-CM

## 2023-05-29 DIAGNOSIS — R262 Difficulty in walking, not elsewhere classified: Secondary | ICD-10-CM
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
## 2023-05-30 ENCOUNTER — Ambulatory Visit: Payer: Medicare Other | Admitting: Physical Therapy

## 2023-05-31 ENCOUNTER — Ambulatory Visit: Payer: Medicare Other

## 2023-06-01 ENCOUNTER — Ambulatory Visit

## 2023-06-01 DIAGNOSIS — R269 Unspecified abnormalities of gait and mobility: Secondary | ICD-10-CM | POA: Diagnosis not present

## 2023-06-01 DIAGNOSIS — M6281 Muscle weakness (generalized): Secondary | ICD-10-CM

## 2023-06-01 DIAGNOSIS — M79605 Pain in left leg: Secondary | ICD-10-CM

## 2023-06-01 DIAGNOSIS — R2689 Other abnormalities of gait and mobility: Secondary | ICD-10-CM

## 2023-06-01 DIAGNOSIS — M5459 Other low back pain: Secondary | ICD-10-CM

## 2023-06-01 DIAGNOSIS — R262 Difficulty in walking, not elsewhere classified: Secondary | ICD-10-CM

## 2023-06-01 DIAGNOSIS — R278 Other lack of coordination: Secondary | ICD-10-CM
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
## 2023-06-04 ENCOUNTER — Ambulatory Visit: Payer: Medicare Other | Admitting: Physical Therapy

## 2023-06-05 ENCOUNTER — Ambulatory Visit: Payer: Medicare Other

## 2023-06-05 DIAGNOSIS — R262 Difficulty in walking, not elsewhere classified: Secondary | ICD-10-CM

## 2023-06-05 DIAGNOSIS — M5459 Other low back pain: Secondary | ICD-10-CM

## 2023-06-05 DIAGNOSIS — M79605 Pain in left leg: Secondary | ICD-10-CM

## 2023-06-05 DIAGNOSIS — R278 Other lack of coordination: Secondary | ICD-10-CM

## 2023-06-05 DIAGNOSIS — M6281 Muscle weakness (generalized): Secondary | ICD-10-CM

## 2023-06-05 DIAGNOSIS — R269 Unspecified abnormalities of gait and mobility: Secondary | ICD-10-CM

## 2023-06-05 DIAGNOSIS — R2689 Other abnormalities of gait and mobility: Secondary | ICD-10-CM
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
## 2023-06-06 ENCOUNTER — Ambulatory Visit: Payer: Medicare Other | Admitting: Physical Therapy

## 2023-06-07 ENCOUNTER — Ambulatory Visit (INDEPENDENT_AMBULATORY_CARE_PROVIDER_SITE_OTHER): Admitting: Urology

## 2023-06-07 ENCOUNTER — Ambulatory Visit: Payer: Medicare Other

## 2023-06-07 VITALS — BP 152/87 | HR 132 | Ht 65.0 in | Wt 164.1 lb

## 2023-06-07 DIAGNOSIS — L729 Follicular cyst of the skin and subcutaneous tissue, unspecified: Secondary | ICD-10-CM | POA: Diagnosis not present

## 2023-06-07 NOTE — Progress Notes (Signed)
 Joseph [name],acting as a scribe for Joseph Gimenez, MD.,have documented all relevant documentation on the behalf of Joseph Gimenez, MD,as directed by  Joseph Gimenez, MD while in the presence of Joseph Gimenez, MD.  06/07/23 2:19 PM   Okey Berg 1981/01/03 [id number]  Referring provider: Lenell Query, [name] [address] [place],  [place] [address]  Chief Complaint  Patient presents with   Establish Care   testicular cyst    HPI:  43 year old male with a history of a right epididymal cyst, initially identified incidentally during an ultrasound in December after presenting with a week of left testicular pain.   The ultrasound showed an incidental 3mm right epididymal cyst, which was deemed unremarkable at the time.   He reports noticing 2 epidermal cysts since February, which have been growing and is bothersome.  He describes a history of ingrown hairs but notes that these cysts do not resolve like ingrown hairs and tend to grow larger if manipulated. He expresses concern about the growth of these cysts, which he describes as epidermal cysts, and is seeking treatment. He denies any current testicular pain.   PMH: Past Medical History:  Diagnosis Date   Asthma    COPD (chronic obstructive pulmonary disease) (HCC)    Eczema    Hypertension     Surgical History: Past Surgical History:  Procedure Laterality Date   ABDOMINAL AORTOGRAM W/LOWER EXTREMITY Left 01/18/2023   Procedure: ABDOMINAL AORTOGRAM W/LOWER EXTREMITY;  Surgeon: Jackquelyn Mass, MD;  Location: [facility];  Service: Cardiovascular;  Laterality: Left;   OLECRANON BURSECTOMY Right 03/16/2022   Procedure: I&D OLECRANON (ELBOW) BURSA;  Surgeon: Lorri Rota, MD;  Location: [facility] ORS;  Service: Orthopedics;  Laterality: Right;    Home Medications:  Allergies as of 06/07/2023       Reactions   Penicillins Shortness Of Breath   Pork-derived Products Nausea Only   Tomato Rash    Banana Hives   Coconut (cocos Nucifera) Hives        Medication List        Accurate as of June 07, 2023  2:19 PM. If you have any questions, ask your nurse or doctor.          STOP taking these medications    cefadroxil  500 MG capsule Commonly known as: DURICEF       TAKE these medications    albuterol  108 (90 Base) MCG/ACT inhaler Commonly known as: VENTOLIN  HFA Inhale 2 puffs into the lungs every 6 (six) hours as needed for wheezing or shortness of breath.   albuterol  (2.5 MG/3ML) 0.083% nebulizer solution Commonly known as: PROVENTIL  Take 2.5 mg by nebulization every 6 (six) hours as needed for wheezing or shortness of breath.   celecoxib  100 MG capsule Commonly known as: CELEBREX  Take 100 mg by mouth 2 (two) times daily. What changed: Another medication with the same name was removed. Continue taking this medication, and follow the directions you see here.   DULoxetine  20 MG capsule Commonly known as: CYMBALTA  Take 1 capsule (20 mg total) by mouth daily.   Dupixent 300 MG/2ML prefilled syringe Generic drug: dupilumab Inject 300 mg into the skin every 14 (fourteen) days.   fluticasone -salmeterol 250-50 MCG/ACT Aepb Commonly known as: ADVAIR Inhale 1 Puff into the lungs every 12 (twelve) hours   furosemide  20 MG tablet Commonly known as: LASIX  Take 20 mg by mouth daily as needed for edema or fluid.   gabapentin  300 MG capsule  Commonly known as: NEURONTIN  Take 300 mg by mouth 3 (three) times daily as needed (Neuropathy pain).   hydrochlorothiazide  25 MG tablet Commonly known as: HYDRODIURIL  Take 25 mg by mouth daily.   Incruse Ellipta  62.5 MCG/ACT Aepb Generic drug: umeclidinium bromide  Inhale 1 puff into the lungs daily.   Jardiance 10 MG Tabs tablet Generic drug: empagliflozin Take 10 mg by mouth daily.   Kerendia 10 MG Tabs Generic drug: Finerenone Take 1 tablet by mouth daily.   lidocaine  5 % Commonly known as: LIDODERM  Place 1 patch  onto the skin daily. Remove & Discard patch within 12 hours or as directed by MD   losartan  100 MG tablet Commonly known as: COZAAR  Take 1 tablet (100 mg total) by mouth daily.   montelukast  10 MG tablet Commonly known as: SINGULAIR  Take 10 mg by mouth at bedtime.   multivitamin with minerals Tabs tablet Take 1 tablet by mouth daily.   omeprazole 20 MG capsule Commonly known as: PRILOSEC Take 20 mg by mouth daily as needed (GERD symptoms).   oxyCODONE  5 MG immediate release tablet Commonly known as: Oxy IR/ROXICODONE  Take 1-2 tablets (5-10 mg total) by mouth every 6 (six) hours as needed for breakthrough pain.   OXYGEN  Inhale into the lungs.   predniSONE  10 MG tablet Commonly known as: DELTASONE  Take 10 mg by mouth daily with breakfast.   QUEtiapine  25 MG tablet Commonly known as: SEROQUEL  Take 1 tablet (25 mg total) by mouth at bedtime.   senna-docusate 8.6-50 MG tablet Commonly known as: Senokot-S Take 1 tablet by mouth at bedtime as needed for mild constipation.   tiZANidine  2 MG tablet Commonly known as: ZANAFLEX  Take 2 mg by mouth 3 (three) times daily.        Allergies:  Allergies  Allergen Reactions   Penicillins Shortness Of Breath   Pork-Derived Products Nausea Only   Tomato Rash   Banana Hives   Coconut (Cocos Nucifera) Hives    Family History: Family History  Problem Relation Age of Onset   Hypertension Mother    Glaucoma Mother    Cancer - Ovarian Sister     Social History:  reports that he has been smoking cigarettes. He started smoking about 30 years ago. He has a 15.2 pack-year smoking history. He has never used smokeless tobacco. He reports current drug use. Drug: Marijuana. He reports that he does not drink alcohol .   Physical Exam: BP (!) 152/87   Pulse (!) 132   Ht 5\' 5"  (1.651 m)   Wt 164 lb 2 oz (74.4 kg)   BMI 27.31 kg/m   Constitutional:  Alert and oriented, No acute distress. HEENT: [place] AT, moist mucus membranes.  Trachea  midline, no masses. GU: 2 epidermal cysts, one on his posterior midline that measured approximately 8 mm and a small anterior cyst that measured approximately 5 mm. Neurologic: Grossly intact, no focal deficits, moving all 4 extremities. Psychiatric: Normal mood and affect.   Assessment & Plan:    1. Epidermal Cysts - He presents with epidermal cysts, one on the posterior midline and another on the anterior, measuring approximately 5mm. These cysts are bothersome to him.  - The cysts are not related to infection or ingrown hairs but are due to blocked follicles. The plan is to excise these cysts in the office under local anesthesia with Lidocaine .  - This approach is preferred due to his multiple medical comorbidities, making it a safer and less expensive option compared to surgical removal  under general anesthesia. Risks, including bleeding and infection, were discussed with him, who understands and agrees to proceed with the office procedure. Follow-up will be scheduled to monitor healing and address any complications.  Return for removal of epidermal cysts.  [facility] [address], [address] [place], [place] [address] [phone]

## 2023-06-12 ENCOUNTER — Ambulatory Visit: Payer: Medicare Other

## 2023-06-14 ENCOUNTER — Ambulatory Visit: Payer: Medicare Other

## 2023-06-19 ENCOUNTER — Ambulatory Visit: Payer: Medicare Other

## 2023-06-21 ENCOUNTER — Ambulatory Visit: Payer: Medicare Other

## 2023-06-26 ENCOUNTER — Ambulatory Visit: Payer: Medicare Other | Admitting: Physical Therapy

## 2023-06-28 ENCOUNTER — Ambulatory Visit: Payer: Medicare Other

## 2023-07-11 ENCOUNTER — Other Ambulatory Visit: Payer: Self-pay | Admitting: Specialist

## 2023-07-11 ENCOUNTER — Ambulatory Visit: Admitting: Physical Therapy

## 2023-07-11 DIAGNOSIS — R918 Other nonspecific abnormal finding of lung field: Secondary | ICD-10-CM

## 2023-07-11 DIAGNOSIS — J4550 Severe persistent asthma, uncomplicated: Secondary | ICD-10-CM

## 2023-07-13 ENCOUNTER — Ambulatory Visit: Admitting: Urology

## 2023-07-13 VITALS — BP 117/78 | HR 106 | Ht 65.0 in | Wt 162.1 lb

## 2023-07-13 DIAGNOSIS — L729 Follicular cyst of the skin and subcutaneous tissue, unspecified: Secondary | ICD-10-CM

## 2023-07-13 DIAGNOSIS — N509 Disorder of male genital organs, unspecified: Secondary | ICD-10-CM

## 2023-07-13 NOTE — Procedures (Signed)
 07/13/23  A timeout was confirmed and the patient's identity was confirmed.  All questions were answered.  Consent was reviewed.  Procedure: This point in time, 2 discrete lesions were excised from the scrotum after he was prepped and draped in the standard sterile fashion.  This  included approximately 1.2 cm epidermal cyst at the base of the scrotum which was excised as an ellipse after anesthetizing the area with 2% lidocaine .  We elected not to send this for pathology given its benign appearance and chronicity.  The wound was closed vertically using a series of 3-0 interrupted chromic sutures and hemostasis at this point was excellent.  Attention was then turned to the smaller left anterior scrotal lesion approximately 6 mm.  This was also anesthetized and excised using a #11 blade.  Both of these were relatively superficial and the entirety of the cyst was excised.  This was also closed in a similar technique albeit transversely at this time using the same interrupted 3-0 chromic sutures.  At the end of procedure, as Misys was excellent and there was no bleeding.  The procedure was well-tolerated.  Postoperative wound care was discussed with the patient.  He may follow-up as needed.  Warning symptoms were reviewed.  Dustin Gimenez, MD

## 2023-07-13 NOTE — Progress Notes (Signed)
 See procedure note.

## 2023-07-23 ENCOUNTER — Ambulatory Visit: Attending: Neurology

## 2023-07-23 ENCOUNTER — Encounter: Payer: Self-pay | Admitting: Physical Therapy

## 2023-07-23 DIAGNOSIS — R2689 Other abnormalities of gait and mobility: Secondary | ICD-10-CM | POA: Diagnosis present

## 2023-07-23 DIAGNOSIS — M6281 Muscle weakness (generalized): Secondary | ICD-10-CM | POA: Insufficient documentation

## 2023-07-23 DIAGNOSIS — M79605 Pain in left leg: Secondary | ICD-10-CM | POA: Insufficient documentation

## 2023-07-23 DIAGNOSIS — R278 Other lack of coordination: Secondary | ICD-10-CM | POA: Insufficient documentation

## 2023-07-23 DIAGNOSIS — R269 Unspecified abnormalities of gait and mobility: Secondary | ICD-10-CM | POA: Diagnosis present

## 2023-07-23 DIAGNOSIS — M5459 Other low back pain: Secondary | ICD-10-CM | POA: Diagnosis present

## 2023-07-23 DIAGNOSIS — R262 Difficulty in walking, not elsewhere classified: Secondary | ICD-10-CM | POA: Diagnosis present
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
## 2023-07-25 ENCOUNTER — Ambulatory Visit: Admitting: Physical Therapy

## 2023-07-25 DIAGNOSIS — M6281 Muscle weakness (generalized): Secondary | ICD-10-CM

## 2023-07-25 DIAGNOSIS — R269 Unspecified abnormalities of gait and mobility: Secondary | ICD-10-CM

## 2023-07-25 DIAGNOSIS — R278 Other lack of coordination: Secondary | ICD-10-CM

## 2023-07-25 DIAGNOSIS — R2689 Other abnormalities of gait and mobility: Secondary | ICD-10-CM

## 2023-07-25 DIAGNOSIS — M79605 Pain in left leg: Secondary | ICD-10-CM

## 2023-07-25 DIAGNOSIS — M5459 Other low back pain: Secondary | ICD-10-CM

## 2023-07-25 DIAGNOSIS — R262 Difficulty in walking, not elsewhere classified: Secondary | ICD-10-CM
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
## 2023-07-30 ENCOUNTER — Ambulatory Visit

## 2023-07-30 DIAGNOSIS — R269 Unspecified abnormalities of gait and mobility: Secondary | ICD-10-CM

## 2023-07-30 DIAGNOSIS — R262 Difficulty in walking, not elsewhere classified: Secondary | ICD-10-CM

## 2023-07-30 DIAGNOSIS — M6281 Muscle weakness (generalized): Secondary | ICD-10-CM

## 2023-07-30 DIAGNOSIS — M79605 Pain in left leg: Secondary | ICD-10-CM

## 2023-07-30 DIAGNOSIS — R2689 Other abnormalities of gait and mobility: Secondary | ICD-10-CM

## 2023-07-30 DIAGNOSIS — R278 Other lack of coordination: Secondary | ICD-10-CM
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
## 2023-08-01 ENCOUNTER — Ambulatory Visit: Admitting: Physical Therapy

## 2023-08-01 DIAGNOSIS — R2689 Other abnormalities of gait and mobility: Secondary | ICD-10-CM

## 2023-08-01 DIAGNOSIS — R278 Other lack of coordination: Secondary | ICD-10-CM

## 2023-08-01 DIAGNOSIS — M79605 Pain in left leg: Secondary | ICD-10-CM

## 2023-08-01 DIAGNOSIS — M5459 Other low back pain: Secondary | ICD-10-CM

## 2023-08-01 DIAGNOSIS — R262 Difficulty in walking, not elsewhere classified: Secondary | ICD-10-CM

## 2023-08-01 DIAGNOSIS — R269 Unspecified abnormalities of gait and mobility: Secondary | ICD-10-CM | POA: Diagnosis not present

## 2023-08-01 DIAGNOSIS — M6281 Muscle weakness (generalized): Secondary | ICD-10-CM
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
## 2023-08-02 ENCOUNTER — Ambulatory Visit
Admission: RE | Admit: 2023-08-02 | Discharge: 2023-08-02 | Disposition: A | Discharge status: Home / Self Care | Source: Ambulatory Visit | Attending: Specialist | Admitting: Specialist

## 2023-08-02 DIAGNOSIS — R918 Other nonspecific abnormal finding of lung field: Secondary | ICD-10-CM

## 2023-08-02 DIAGNOSIS — J4550 Severe persistent asthma, uncomplicated: Secondary | ICD-10-CM

## 2023-08-03 ENCOUNTER — Ambulatory Visit
Admission: RE | Admit: 2023-08-03 | Discharge: 2023-08-03 | Disposition: A | Discharge status: Home / Self Care | Source: Ambulatory Visit | Attending: Specialist | Admitting: Specialist

## 2023-08-03 DIAGNOSIS — R918 Other nonspecific abnormal finding of lung field: Secondary | ICD-10-CM | POA: Diagnosis present

## 2023-08-03 DIAGNOSIS — J4550 Severe persistent asthma, uncomplicated: Secondary | ICD-10-CM | POA: Insufficient documentation

## 2023-08-06 ENCOUNTER — Emergency Department: Admission: EM | Admit: 2023-08-06 | Discharge: 2023-08-06 | Disposition: A | Discharge status: Home / Self Care

## 2023-08-06 ENCOUNTER — Other Ambulatory Visit: Payer: Self-pay

## 2023-08-06 ENCOUNTER — Ambulatory Visit: Admitting: Physical Therapy

## 2023-08-06 ENCOUNTER — Emergency Department

## 2023-08-06 DIAGNOSIS — M546 Pain in thoracic spine: Secondary | ICD-10-CM | POA: Diagnosis present

## 2023-08-06 DIAGNOSIS — J4489 Other specified chronic obstructive pulmonary disease: Secondary | ICD-10-CM | POA: Insufficient documentation

## 2023-08-06 DIAGNOSIS — I10 Essential (primary) hypertension: Secondary | ICD-10-CM | POA: Diagnosis not present

## 2023-08-06 LAB — URINALYSIS, ROUTINE W REFLEX MICROSCOPIC
Bacteria, UA: NONE SEEN
Bilirubin Urine: NEGATIVE
Glucose, UA: >=500 mg/dL — AB
Hgb urine dipstick: NEGATIVE
Ketones, ur: NEGATIVE mg/dL
Leukocytes,Ua: NEGATIVE
Nitrite: NEGATIVE
Protein, ur: 100 mg/dL — AB
Specific Gravity, Urine: 1.027 (ref 1.005–1.030)
pH: 6 (ref 5.0–8.0)

## 2023-08-06 LAB — CBC WITH DIFFERENTIAL/PLATELET
Abs Immature Granulocytes: 0.26 10*3/uL — ABNORMAL HIGH (ref 0.00–0.07)
Basophils Absolute: 0.1 10*3/uL (ref 0.0–0.1)
Basophils Relative: 1 %
Eosinophils Absolute: 0.4 10*3/uL (ref 0.0–0.5)
Eosinophils Relative: 2 %
HCT: 45.3 % (ref 39.0–52.0)
Hemoglobin: 14.1 g/dL (ref 13.0–17.0)
Immature Granulocytes: 1 %
Lymphocytes Relative: 16 %
Lymphs Abs: 3.1 10*3/uL (ref 0.7–4.0)
MCH: 25.5 pg — ABNORMAL LOW (ref 26.0–34.0)
MCHC: 31.1 g/dL (ref 30.0–36.0)
MCV: 82.1 fL (ref 80.0–100.0)
Monocytes Absolute: 2.3 10*3/uL — ABNORMAL HIGH (ref 0.1–1.0)
Monocytes Relative: 12 %
Neutro Abs: 13.6 10*3/uL — ABNORMAL HIGH (ref 1.7–7.7)
Neutrophils Relative %: 68 %
Platelets: 450 10*3/uL — ABNORMAL HIGH (ref 150–400)
RBC: 5.52 MIL/uL (ref 4.22–5.81)
RDW: 18.7 % — ABNORMAL HIGH (ref 11.5–15.5)
WBC: 19.8 10*3/uL — ABNORMAL HIGH (ref 4.0–10.5)
nRBC: 0 % (ref 0.0–0.2)

## 2023-08-06 LAB — BASIC METABOLIC PANEL WITH GFR
Anion gap: 8 (ref 5–15)
BUN: 21 mg/dL — ABNORMAL HIGH (ref 6–20)
CO2: 26 mmol/L (ref 22–32)
Calcium: 9 mg/dL (ref 8.9–10.3)
Chloride: 107 mmol/L (ref 98–111)
Creatinine, Ser: 0.89 mg/dL (ref 0.61–1.24)
GFR, Estimated: >60 mL/min (ref >60)
Glucose, Bld: 91 mg/dL (ref 70–99)
Potassium: 4 mmol/L (ref 3.5–5.1)
Sodium: 141 mmol/L (ref 135–145)

## 2023-08-06 LAB — LACTIC ACID, PLASMA: Lactic Acid, Venous: 1 mmol/L (ref 0.5–1.9)

## 2023-08-06 MED ORDER — MORPHINE SULFATE (PF) 4 MG/ML IV SOLN
4.0000 mg | Freq: Once | INTRAVENOUS | Status: AC
  Administered 2023-08-06: 4 mg via INTRAVENOUS
  Filled 2023-08-06: qty 1

## 2023-08-06 MED ORDER — IPRATROPIUM-ALBUTEROL 0.5-2.5 (3) MG/3ML IN SOLN
3.0000 mL | Freq: Once | RESPIRATORY_TRACT | Status: AC
  Administered 2023-08-06: 3 mL via RESPIRATORY_TRACT
  Filled 2023-08-06: qty 3

## 2023-08-06 MED ORDER — HYDROCODONE-ACETAMINOPHEN 5-325 MG PO TABS: 1.0000 | ORAL_TABLET | Freq: Three times a day (TID) | ORAL | 0 refills | Status: AC | PRN

## 2023-08-06 MED ORDER — CYCLOBENZAPRINE HCL 5 MG PO TABS: 5.0000 mg | ORAL_TABLET | Freq: Three times a day (TID) | ORAL | 0 refills | Status: DC | PRN

## 2023-08-06 MED ORDER — DEXAMETHASONE SODIUM PHOSPHATE 10 MG/ML IJ SOLN
10.0000 mg | Freq: Once | INTRAMUSCULAR | Status: AC
  Administered 2023-08-06: 10 mg via INTRAVENOUS
  Filled 2023-08-06: qty 1

## 2023-08-06 MED ORDER — CYCLOBENZAPRINE HCL 10 MG PO TABS
10.0000 mg | ORAL_TABLET | Freq: Once | ORAL | Status: AC
  Administered 2023-08-06: 10 mg via ORAL
  Filled 2023-08-06: qty 1

## 2023-08-06 MED ORDER — ONDANSETRON HCL 4 MG/2ML IJ SOLN
4.0000 mg | Freq: Once | INTRAMUSCULAR | Status: AC
  Administered 2023-08-06: 4 mg via INTRAVENOUS
  Filled 2023-08-06: qty 2

## 2023-08-06 NOTE — ED Triage Notes (Signed)
 Pt to ED for c/o back pain and difficulty walking.  Pt states similar symptoms in October and was diagnosed with an infection in the spine.

## 2023-08-06 NOTE — Discharge Instructions (Signed)
 Your exam, labs, and CT scan are normal and reassuring at this time.  No signs of any acute discitis or bone infection of the thoracic spine.  Your labs did not reveal any evidence of overall infection.  You should take the prescription meds as directed.  Follow with primary provider or specialist as discussed.  Return to the ED for worsening symptoms.

## 2023-08-06 NOTE — ED Provider Notes (Signed)
 [facility] Emergency Department Provider Note     Event Date/Time   First MD Initiated Contact with Patient 08/06/23 1113     (approximate)   History   Back Pain   HPI  Joseph [name] is a 43 y.o. male with a history of asthma, COPD, eczema, HTN, prior MSSA bacteremia, and DDD, presents to the ED for acute nontraumatic midline back pain.  Patient with endorsed 3 days of symptoms, and decided to present to the ED after similar episode by his report, about 6 months ago.  Patient is endorsing midline thoracic pain without referral.  He denies any interim fevers, chills, or sweats.  No bladder or bowel incontinence, foot drop, saddle anesthesia reported.  Patient denies any recent antibiotic treatment.  He currently takes prednisone  10 mg daily, and is in PT therapy twice weekly.  Past medical history is significant for concern for a iliac blood clot and discitis versus osteomyelitis to his lumbar spine on his last hospital admission.  Chart review reveals patient was found to not have an occult no occlusive thrombus to the left common iliac.  He was found however, to have MSSA on his splint cultures, and was treated for total 42 days with vancomycin  and cefepime  via PICC line.  He denies any interim fevers, infections, or illnesses.  He was concerned enough with the onset of his back pain to present to the ED, concern for similar presentation.  Patient denies any chest pain, nausea, vomiting, or bowel changes.  He presents to the ED without his chronic O2, and is placed in the treatment room, but not immediately placed on O2.  His O2 sats are 96% on room air.  Physical Exam   Triage Vital Signs: ED Triage Vitals  Encounter Vitals Group     BP 08/06/23 1051 (!) 156/89     Girls Systolic BP Percentile --      Girls Diastolic BP Percentile --      Boys Systolic BP Percentile --      Boys Diastolic BP Percentile --      Pulse Rate 08/06/23 1051 96     Resp 08/06/23  1051 20     Temp 08/06/23 1049 98.4 F (36.9 C)     Temp Source 08/06/23 1049 Oral     SpO2 08/06/23 1051 96 %     Weight 08/06/23 1054 160 lb (72.6 kg)     Height 08/06/23 1054 5' 5 (1.651 m)     Head Circumference --      Peak Flow --      Pain Score 08/06/23 1050 10     Pain Loc --      Pain Education --      Exclude from Growth Chart --     Most recent vital signs: Vitals:   08/06/23 1440 08/06/23 1726  BP: (!) 150/87 116/89  Pulse: 88 86  Resp: 18 18  Temp: 98 F (36.7 C) 98.2 F (36.8 C)  SpO2: 97% 95%    General Awake, no distress. NAD HEENT NCAT. PERRL. EOMI. No rhinorrhea. Mucous membranes are moist.  CV:  Good peripheral perfusion. RRR RESP:  Normal effort. CTA ABD:  No distention.  Soft and.  Normoactive bowel sounds.  No rebound, guarding, or rigidity noted. MSK:  Normal spinal alignment with midline tenderness to palpation of the thoracic spine.  No lumbar spine tenderness appreciated.  No guarding, rebound, spasm, deformity appreciated.  AROM of all extremities. NEURO: Cranial  nerves II to XII grossly intact.  Normal LE DTRs bilaterally.   ED Results / Procedures / Treatments   Labs (all labs ordered are listed, but only abnormal results are displayed) Labs Reviewed  BASIC METABOLIC PANEL WITH GFR - Abnormal; Notable for the following components:      Result Value   BUN 21 (*)    All other components within normal limits  CBC WITH DIFFERENTIAL/PLATELET - Abnormal; Notable for the following components:   WBC 19.8 (*)    MCH 25.5 (*)    RDW 18.7 (*)    Platelets 450 (*)    Neutro Abs 13.6 (*)    Monocytes Absolute 2.3 (*)    Abs Immature Granulocytes 0.26 (*)    All other components within normal limits  URINALYSIS, ROUTINE W REFLEX MICROSCOPIC - Abnormal; Notable for the following components:   Color, Urine YELLOW (*)    APPearance CLEAR (*)    Glucose, UA >=500 (*)    Protein, ur 100 (*)    All other components within normal limits  CULTURE,  BLOOD (ROUTINE X 2)  CULTURE, BLOOD (ROUTINE X 2)  LACTIC ACID, PLASMA    EKG   RADIOLOGY  I personally viewed and evaluated these images as part of my medical decision making, as well as reviewing the written report by the radiologist.  ED Provider Interpretation: No acute findings of the thoracic spine specifically, no evidence of discitis or osteomyelitis  CT Thoracic Spine Wo Contrast Result Date: 08/06/2023 CLINICAL DATA:  Thoracic radiculopathy, infection suspected, no prior imaging. Back pain EXAM: CT THORACIC SPINE WITHOUT CONTRAST TECHNIQUE: Multidetector CT images of the thoracic were obtained using the standard protocol without intravenous contrast. RADIATION DOSE REDUCTION: This exam was performed according to the departmental dose-optimization program which includes automated exposure control, adjustment of the mA and/or kV according to patient size and/or use of iterative reconstruction technique. COMPARISON:  Chest CT 02/20/2023. lumbar spine MRI 01/18/2023. FINDINGS: Alignment: Normal Vertebrae: No acute fracture or focal pathologic process. Paraspinal and other soft tissues: Negative. Disc levels: Early disc space narrowing and spurring. Endplate sclerosis noted at multiple levels compatible with early discogenic changes. No disc herniation. IMPRESSION: No acute bony abnormality. No evidence of discitis or osteomyelitis. Electronically Signed   By: Franky Crease M.D.   On: 08/06/2023 16:17    PROCEDURES:  Critical Care performed: No  Procedures   MEDICATIONS ORDERED IN ED: Medications  morphine  (PF) 4 MG/ML injection 4 mg (4 mg Intravenous Given 08/06/23 1236)  ondansetron  (ZOFRAN ) injection 4 mg (4 mg Intravenous Given 08/06/23 1236)  ipratropium-albuterol  (DUONEB) 0.5-2.5 (3) MG/3ML nebulizer solution 3 mL (3 mLs Nebulization Given 08/06/23 1237)  dexamethasone  (DECADRON ) injection 10 mg (10 mg Intravenous Given 08/06/23 1720)  cyclobenzaprine  (FLEXERIL ) tablet 10 mg (10  mg Oral Given 08/06/23 1723)     IMPRESSION / MDM / ASSESSMENT AND PLAN / ED COURSE  I reviewed the triage vital signs and the nursing notes.                              Differential diagnosis includes, but is not limited to, thoracic DDD, thoracic radiculopathy, discitis, osteomyelitis, compression fracture, myalgias, asthma/COPD exacerbation  Patient's presentation is most consistent with acute complicated illness / injury requiring diagnostic workup.  Patient's diagnosis is consistent with acute midline thoracic back pain.  Patient presents in no acute distress endorsing 3 days of persistent midline thoracic spine pain.  His symptoms concerned him enough to report to the ED given he had previous admission for bacteremia without evidence of osteomyelitis.  Patient presents with no red flags on exam.  No bladder or bowel incontinence, foot drop, or saddle anesthesia noted.  Exam is reassuring overall.  No acute lab abnormalities.  His leukocytosis is likely due to his chronic steroid use.  Lactic acid is normal and remaining labs are within normal limits.  No UA evidence of any acute infectious process.  CT images reviewed by me, revealed no acute fracture, dislocation, or infectious bony process to the thoracic spine.  Patient and his wife are reassured by his exam and workup at this time.  He is reporting significant Cuprofen symptoms after DuoNeb administration as well as IV medication administration.  I discussed the options including admission to the patient and his wife.  He feels stable at this time and is reporting his desire to go home and follow-up as an outpatient.  Blood cultures are pending at this time.  Will discharge the patient after IV Decadron  dose.  Patient will be discharged home with prescriptions for Flexeril  and hydrocodone . Patient is to follow up with his primary provider, and specialist as suggested, as needed or otherwise directed. Patient is given ED precautions to return  to the ED for any worsening or new symptoms.   FINAL CLINICAL IMPRESSION(S) / ED DIAGNOSES   Final diagnoses:  Acute midline thoracic back pain     Rx / DC Orders   ED Discharge Orders          Ordered    cyclobenzaprine  (FLEXERIL ) 5 MG tablet  3 times daily PRN        08/06/23 1700    HYDROcodone -acetaminophen  (NORCO/VICODIN) 5-325 MG tablet  3 times daily PRN        08/06/23 1700             Note:  This document was prepared using Dragon voice recognition software and may include unintentional dictation errors.    Loyd Candida LULLA Aldona, [name] 08/06/23 1916    Clarine Ozell LABOR, MD 08/08/23 [phone]

## 2023-08-08 ENCOUNTER — Ambulatory Visit

## 2023-08-10 ENCOUNTER — Ambulatory Visit: Admitting: Physical Therapy

## 2023-08-11 LAB — CULTURE, BLOOD (ROUTINE X 2)
Culture: NO GROWTH
Culture: NO GROWTH

## 2023-08-13 ENCOUNTER — Ambulatory Visit: Admitting: Physical Therapy

## 2023-08-13 DIAGNOSIS — R262 Difficulty in walking, not elsewhere classified: Secondary | ICD-10-CM

## 2023-08-13 DIAGNOSIS — R269 Unspecified abnormalities of gait and mobility: Secondary | ICD-10-CM | POA: Diagnosis not present

## 2023-08-13 DIAGNOSIS — R278 Other lack of coordination: Secondary | ICD-10-CM

## 2023-08-13 DIAGNOSIS — M5459 Other low back pain: Secondary | ICD-10-CM

## 2023-08-13 DIAGNOSIS — M79605 Pain in left leg: Secondary | ICD-10-CM

## 2023-08-13 DIAGNOSIS — M6281 Muscle weakness (generalized): Secondary | ICD-10-CM

## 2023-08-13 DIAGNOSIS — R2689 Other abnormalities of gait and mobility: Secondary | ICD-10-CM
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
## 2023-08-15 ENCOUNTER — Ambulatory Visit: Attending: Neurology | Admitting: Physical Therapy

## 2023-08-15 DIAGNOSIS — R278 Other lack of coordination: Secondary | ICD-10-CM | POA: Insufficient documentation

## 2023-08-15 DIAGNOSIS — R262 Difficulty in walking, not elsewhere classified: Secondary | ICD-10-CM | POA: Insufficient documentation

## 2023-08-15 DIAGNOSIS — M79605 Pain in left leg: Secondary | ICD-10-CM | POA: Insufficient documentation

## 2023-08-15 DIAGNOSIS — R269 Unspecified abnormalities of gait and mobility: Secondary | ICD-10-CM | POA: Insufficient documentation

## 2023-08-15 DIAGNOSIS — R2689 Other abnormalities of gait and mobility: Secondary | ICD-10-CM | POA: Diagnosis present

## 2023-08-15 DIAGNOSIS — M6281 Muscle weakness (generalized): Secondary | ICD-10-CM | POA: Diagnosis present

## 2023-08-15 DIAGNOSIS — M5459 Other low back pain: Secondary | ICD-10-CM | POA: Diagnosis present
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
## 2023-08-25 ENCOUNTER — Other Ambulatory Visit: Payer: Self-pay

## 2023-08-25 ENCOUNTER — Emergency Department
Admission: EM | Admit: 2023-08-25 | Discharge: 2023-08-25 | Disposition: A | Discharge status: Home / Self Care | Attending: Emergency Medicine | Admitting: Emergency Medicine

## 2023-08-25 ENCOUNTER — Emergency Department

## 2023-08-25 DIAGNOSIS — I10 Essential (primary) hypertension: Secondary | ICD-10-CM | POA: Insufficient documentation

## 2023-08-25 DIAGNOSIS — F17200 Nicotine dependence, unspecified, uncomplicated: Secondary | ICD-10-CM | POA: Insufficient documentation

## 2023-08-25 DIAGNOSIS — M25512 Pain in left shoulder: Secondary | ICD-10-CM | POA: Insufficient documentation

## 2023-08-25 DIAGNOSIS — D72829 Elevated white blood cell count, unspecified: Secondary | ICD-10-CM | POA: Diagnosis not present

## 2023-08-25 DIAGNOSIS — M79602 Pain in left arm: Secondary | ICD-10-CM | POA: Diagnosis present

## 2023-08-25 DIAGNOSIS — J4489 Other specified chronic obstructive pulmonary disease: Secondary | ICD-10-CM | POA: Insufficient documentation

## 2023-08-25 DIAGNOSIS — M5412 Radiculopathy, cervical region: Secondary | ICD-10-CM | POA: Insufficient documentation

## 2023-08-25 HISTORY — DX: Type 2 diabetes mellitus without complications: E11.9

## 2023-08-25 LAB — CBC WITH DIFFERENTIAL/PLATELET
Abs Immature Granulocytes: 0.16 K/uL — ABNORMAL HIGH (ref 0.00–0.07)
Basophils Absolute: 0.1 K/uL (ref 0.0–0.1)
Basophils Relative: 1 %
Eosinophils Absolute: 0.4 K/uL (ref 0.0–0.5)
Eosinophils Relative: 2 %
HCT: 47.4 % (ref 39.0–52.0)
Hemoglobin: 14.8 g/dL (ref 13.0–17.0)
Immature Granulocytes: 1 %
Lymphocytes Relative: 7 %
Lymphs Abs: 1.4 K/uL (ref 0.7–4.0)
MCH: 25.6 pg — ABNORMAL LOW (ref 26.0–34.0)
MCHC: 31.2 g/dL (ref 30.0–36.0)
MCV: 81.9 fL (ref 80.0–100.0)
Monocytes Absolute: 1.3 K/uL — ABNORMAL HIGH (ref 0.1–1.0)
Monocytes Relative: 6 %
Neutro Abs: 18.3 K/uL — ABNORMAL HIGH (ref 1.7–7.7)
Neutrophils Relative %: 83 %
Platelets: 449 K/uL — ABNORMAL HIGH (ref 150–400)
RBC: 5.79 MIL/uL (ref 4.22–5.81)
RDW: 17.2 % — ABNORMAL HIGH (ref 11.5–15.5)
WBC: 21.6 K/uL — ABNORMAL HIGH (ref 4.0–10.5)
nRBC: 0 % (ref 0.0–0.2)

## 2023-08-25 LAB — BASIC METABOLIC PANEL WITH GFR
Anion gap: 10 (ref 5–15)
BUN: 20 mg/dL (ref 6–20)
CO2: 27 mmol/L (ref 22–32)
Calcium: 9.2 mg/dL (ref 8.9–10.3)
Chloride: 102 mmol/L (ref 98–111)
Creatinine, Ser: 0.92 mg/dL (ref 0.61–1.24)
GFR, Estimated: >60 mL/min (ref >60)
Glucose, Bld: 100 mg/dL — ABNORMAL HIGH (ref 70–99)
Potassium: 3.8 mmol/L (ref 3.5–5.1)
Sodium: 139 mmol/L (ref 135–145)

## 2023-08-25 LAB — TROPONIN I (HIGH SENSITIVITY)
Troponin I (High Sensitivity): 7 ng/L (ref <18)
Troponin I (High Sensitivity): 6 ng/L (ref <18)

## 2023-08-25 MED ORDER — KETOROLAC TROMETHAMINE 15 MG/ML IJ SOLN
15.0000 mg | Freq: Once | INTRAMUSCULAR | Status: AC
  Administered 2023-08-25: 15 mg via INTRAVENOUS

## 2023-08-25 MED ORDER — KETOROLAC TROMETHAMINE 15 MG/ML IJ SOLN
15.0000 mg | Freq: Once | INTRAMUSCULAR | Status: DC
  Filled 2023-08-25: qty 1

## 2023-08-25 MED ORDER — GADOBUTROL 1 MMOL/ML IV SOLN
7.0000 mL | Freq: Once | INTRAVENOUS | Status: AC | PRN
  Administered 2023-08-25: 7 mL via INTRAVENOUS

## 2023-08-25 NOTE — ED Notes (Signed)
 Pt reports left shoulder pain for that past 3 days that radiated to left arm. Pt denies any injury to shoulder. Pt talks in complete sentences no respiratory distress noted

## 2023-08-25 NOTE — ED Notes (Signed)
US Tech at bedside.

## 2023-08-25 NOTE — Discharge Instructions (Addendum)
 You have narrowing at C5-C6 which is likely the reason for your pain down your left arm.  Please follow-up with neurosurgery for continued management.  Please return for any new, worsening, or change in symptoms or other concerns.

## 2023-08-25 NOTE — ED Triage Notes (Signed)
 Pt to ED for L shoulder pain since 3 days ago. No known injury. Pain shoots down L arm and is worse with movement. Pt wears 2L chronic.

## 2023-08-25 NOTE — ED Provider Notes (Signed)
 [facility] Provider Note    Event Date/Time   First MD Initiated Contact with Patient 08/25/23 1117     (approximate)   History   Shoulder Pain   HPI  Pal Shell is a 43 y.o. male with a past medical history of COPD on 2 L of home O2 at all times, still smoking, GERD, hypertension who presents today for evaluation of left arm pain.  Patient reports that his symptoms began approximately 3 days ago in his shoulder area and radiates down to his forearm along the radial aspect of his arm.  He reports that his pain is worsened with moving his neck.  He has not had to increase his home oxygen .  He reports that he has had intermittent chest pain but this is not new for him.  He does not feel that it is worsened with exertion.  He denies weakness.  No injuries.  He has not had any pain with deep inspiration.  Patient Active Problem List   Diagnosis Date Noted   Thrombocytosis 02/02/2023   Family history of ovarian cancer 02/02/2023   MSSA bacteremia 01/19/2023   Acute deep vein thrombosis (DVT) of iliac vein of left lower extremity (HCC) 01/17/2023   Acute midline low back pain without sciatica 01/17/2023   SIRS (systemic inflammatory response syndrome) (HCC) 01/17/2023   Degeneration of intervertebral disc of lumbar region with discogenic back pain 01/17/2023   Cellulitis of right upper extremity 03/15/2022   Septic bursitis of elbow, right 03/15/2022   Leukocytosis 08/29/2021   Lymphedema of both lower extremities 08/29/2021   OSA (obstructive sleep apnea) 08/23/2021   COPD with acute exacerbation (HCC) 08/21/2021   COPD exacerbation (HCC) 08/07/2021   Acute asthma exacerbation 12/10/2020   COPD with acute bronchitis (HCC) 12/10/2020   Chronic respiratory failure (HCC) 12/10/2020   Essential hypertension 12/10/2020   GERD (gastroesophageal reflux disease) 12/10/2020   Sepsis (HCC) 12/05/2017   Prediabetes 09/24/2014   Tobacco use 08/20/2013   Severe  persistent asthma 12/04/2012          Physical Exam   Triage Vital Signs: ED Triage Vitals  Encounter Vitals Group     BP 08/25/23 1007 116/86     Girls Systolic BP Percentile --      Girls Diastolic BP Percentile --      Boys Systolic BP Percentile --      Boys Diastolic BP Percentile --      Pulse Rate 08/25/23 1007 100     Resp 08/25/23 1007 20     Temp 08/25/23 1007 98.3 F (36.8 C)     Temp Source 08/25/23 1007 Oral     SpO2 08/25/23 1007 99 %     Weight 08/25/23 1004 160 lb (72.6 kg)     Height 08/25/23 1004 5' 5 (1.651 m)     Head Circumference --      Peak Flow --      Pain Score 08/25/23 1003 10     Pain Loc --      Pain Education --      Exclude from Growth Chart --     Most recent vital signs: Vitals:   08/25/23 1007 08/25/23 1349  BP: 116/86 (!) 119/90  Pulse: 100 96  Resp: 20 16  Temp: 98.3 F (36.8 C) 97.8 F (36.6 C)  SpO2: 99% 95%    Physical Exam Vitals and nursing note reviewed.  Constitutional:      General: Awake and alert.  No acute distress.    Appearance: Normal appearance. The patient is normal weight.  HENT:     Head: Normocephalic and atraumatic.     Mouth: Mucous membranes are moist.  Eyes:     General: PERRL. Normal EOMs        Right eye: No discharge.        Left eye: No discharge.     Conjunctiva/sclera: Conjunctivae normal.  Cardiovascular:     Rate and Rhythm: Normal rate and regular rhythm.     Pulses: Normal pulses.  Pulmonary:     Effort: Pulmonary effort is normal. No respiratory distress.  On 2 L nasal cannula    Breath sounds: Normal breath sounds.  Abdominal:     Abdomen is soft. There is no abdominal tenderness. No rebound or guarding. No distention. Musculoskeletal:        General: No swelling. Normal range of motion.     Cervical back: Normal range of motion and neck supple.  No midline cervical spine tenderness.  Full range of motion of neck.  Positive Spurling test to the left.  Positive distraction test.   Negative Lhermitte sign.  Negative Hoffmann sign.  Normal strength and sensation in bilateral upper extremities. Normal grip strength bilaterally.  Normal intrinsic muscle function of the hand bilaterally.  Normal radial pulses bilaterally. No appreciable swelling of bilateral lower extremities.  Normal range of motion of bilateral lower extremities.  Feet are warm and well-perfused bilaterally.  Normal pedal pulses bilaterally. Skin:    General: Skin is warm and dry.     Capillary Refill: Capillary refill takes less than 2 seconds.     Findings: No rash.  Neurological:     Mental Status: The patient is awake and alert.      ED Results / Procedures / Treatments   Labs (all labs ordered are listed, but only abnormal results are displayed) Labs Reviewed  CBC WITH DIFFERENTIAL/PLATELET - Abnormal; Notable for the following components:      Result Value   WBC 21.6 (*)    MCH 25.6 (*)    RDW 17.2 (*)    Platelets 449 (*)    Neutro Abs 18.3 (*)    Monocytes Absolute 1.3 (*)    Abs Immature Granulocytes 0.16 (*)    All other components within normal limits  BASIC METABOLIC PANEL WITH GFR - Abnormal; Notable for the following components:   Glucose, Bld 100 (*)    All other components within normal limits  TROPONIN I (HIGH SENSITIVITY)  TROPONIN I (HIGH SENSITIVITY)     EKG     RADIOLOGY I independently reviewed and interpreted imaging and agree with radiologists findings.     PROCEDURES:  Critical Care performed:   Procedures   MEDICATIONS ORDERED IN ED: Medications  gadobutrol  (GADAVIST ) 1 MMOL/ML injection 7 mL (7 mLs Intravenous Contrast Given 08/25/23 1437)  ketorolac  (TORADOL ) 15 MG/ML injection 15 mg (15 mg Intravenous Given 08/25/23 1629)     IMPRESSION / MDM / ASSESSMENT AND PLAN / ED COURSE  I reviewed the triage vital signs and the nursing notes.   Differential diagnosis includes, but is not limited to, cervical radiculopathy, discitis, osteomyelitis,  avascular necrosis, rotator cuff injury.  I reviewed the patient's chart.  Patient has seen oncology most recently 04/03/2023 for leukocytosis, unspecified with predominantly neutrophilia.  He was hospitalized from 01/17/2023 until 01/25/2023 due to MSSA bacteremia with CT findings of L4-S1 osteomyelitis/discitis however MRI ruled out this as a source.  His  TB was negative.  HIV negative.  TTE echocardiogram unremarkable.  He was discharged on 4 weeks of cefazolin  per ID recommendation.  A protein electrophoresis that showed M protein 0.1.  He is on chronic steroids which is likely also contributing to his leukocytosis.  Patient is awake and alert, hemodynamically stable and afebrile.  He has a positive Spurling test to the left, negative Lhermitte sign, negative Hoffmann sign.  Further workup is indicated.  Labs obtained reveal leukocytosis to 21 with a neutrophilic predominance which is his baseline per chart review.  This is the reason for which he follows with oncology.  This is also thought to be due to his chronic steroid use.  Given his history of discitis/osteomyelitis, MRI cervical spine obtained.  When I went to reevaluate the patient, he was now complaining of pain and swelling to his right calf area.  There is no appreciable swelling on my exam, though given his history of possible iliac blood clot on the opposite side, (however, the subsequent CT venogram that showed no blood clot), will obtain an ultrasound.  He also requested an x-ray of his shoulder.  Ultrasound is negative for DVT.  X-ray of his shoulder is unremarkable.  His feet are warm warm and well-perfused bilaterally.  Normal pedal pulses bilaterally.  Normal capillary refill bilaterally.  Full and normal range of motion of bilateral ankles, knees, hips.  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  He has mild diffuse disc bulging.  I  recommended that he follow-up with neurosurgery and the appropriate follow-up information was provided.  His pain was adequately controlled with Toradol .  We discussed all of his findings and I recommended close outpatient follow-up with neurosurgery.  He has full and normal range of motion and sensation and strength in his bilateral upper extremities, equal bilaterally.  Patient and his wife understand and agree with plan.  Patient was discharged in stable condition.  Patient was discussed with Dr. Malvina who agrees with assessment and plan.   Patient's presentation is most consistent with acute presentation with potential threat to life or bodily function.    FINAL CLINICAL IMPRESSION(S) / ED DIAGNOSES   Final diagnoses:  Cervical radiculopathy  Acute pain of left shoulder  Leukocytosis, unspecified type     Rx / DC Orders   ED Discharge Orders     None        Note:  This document was prepared using Dragon voice recognition software and may include unintentional dictation errors.   Madina Galati E, [name] 08/25/23 1706    Dicky Anes, MD 09/08/23 [phone]

## 2023-10-01 ENCOUNTER — Inpatient Hospital Stay: Payer: Medicare Other | Admitting: Oncology

## 2023-10-01 ENCOUNTER — Inpatient Hospital Stay: Payer: Medicare Other | Attending: Family Medicine

## 2023-10-01 ENCOUNTER — Encounter: Payer: Self-pay | Admitting: Oncology

## 2023-10-08 DIAGNOSIS — Z720 Tobacco use: Secondary | ICD-10-CM

## 2023-10-08 DIAGNOSIS — J4550 Severe persistent asthma, uncomplicated: Secondary | ICD-10-CM

## 2023-10-08 DIAGNOSIS — K449 Diaphragmatic hernia without obstruction or gangrene: Secondary | ICD-10-CM

## 2023-10-08 HISTORY — DX: Severe persistent asthma, uncomplicated: J45.50

## 2023-10-08 HISTORY — DX: Tobacco use: Z72.0

## 2023-10-08 HISTORY — DX: Diaphragmatic hernia without obstruction or gangrene: K44.9

## 2023-10-08 NOTE — Progress Notes (Signed)
 History of Present Illness: Joseph [name] is a 43 y.o. male that presents to clinic for recheck. Hx of asthma, copd, still smoking, osa on cpap, oxygen , chest opacity nodules much improved   Current Medications:  Current Outpatient Medications  Medication Sig Dispense Refill  . albuterol  (PROVENTIL ) 2.5 mg /3 mL (0.083 %) nebulizer solution Take 3 mLs (2.5 mg total) by nebulization every 6 (six) hours as needed for Wheezing or Shortness of Breath 180 mL 6  . albuterol  MDI, PROVENTIL , VENTOLIN , PROAIR , HFA 90 mcg/actuation inhaler Inhale 2 inhalations into the lungs every 4 (four) hours as needed for Wheezing 3 each 3  . celecoxib  (CELEBREX ) 100 MG capsule TAKE 1 CAPSULE(100 MG) BY MOUTH TWICE DAILY 60 capsule 5  . dupilumab (DUPIXENT) 300 mg/2 mL pen injector Inject 2 mLs (300 mg total) subcutaneously every 14 (fourteen) days 4 mL 11  . empagliflozin (JARDIANCE) 10 mg tablet Take 1 tablet (10 mg total) by mouth once daily 30 tablet 11  . EPINEPHrine (EPIPEN) 0.3 mg/0.3 mL auto-injector Inject 0.3 mLs (0.3 mg total) into the muscle as needed for Anaphylaxis 1 Pen 1  . fluticasone  propion-salmeteroL (ADVAIR DISKUS) 250-50 mcg/dose diskus inhaler Inhale 1 Puff into the lungs every 12 (twelve) hours 1 each 4  . fluticasone  propion-salmeteroL (ADVAIR HFA) 230-21 mcg/actuation inhaler Inhale 2 inhalations into the lungs every 12 (twelve) hours 12 g 5  . FUROsemide  (LASIX ) 20 MG tablet Take 1 tablet (20 mg total) by mouth once daily 30 tablet 3  . hydroCHLOROthiazide  (HYDRODIURIL ) 25 MG tablet TAKE 1 TABLET(25 MG) BY MOUTH DAILY 30 tablet 4  . KERENDIA 10 mg Tab Take 1 tablet by mouth once daily    . losartan  (COZAAR ) 100 MG tablet TAKE 1 TABLET(100 MG) BY MOUTH DAILY 100 tablet 1  . montelukast  (SINGULAIR ) 10 mg tablet TAKE 1 TABLET(10 MG) BY MOUTH AT BEDTIME 90 tablet 7  . multivitamin with minerals tablet Take 1 tablet by mouth once daily    . omeprazole (PRILOSEC) 20 MG DR capsule TAKE 1  CAPSULE(20 MG) BY MOUTH EVERY DAY 90 capsule 3  . predniSONE  (DELTASONE ) 2.5 MG tablet Take 3 tablets (7.5 mg total) by mouth once daily (Patient taking differently: Take 10 mg by mouth once daily) 90 tablet 0  . sildenafil  (REVATIO ) 20 mg tablet TAKE 3 TABLETS(60 MG) BY MOUTH EVERY DAY 30 MINUTES BEFORE SEXUAL ACTIVITY AS NEEDED 30 tablet 5  . umeclidinium (INCRUSE ELLIPTA ) 62.5 mcg/actuation DsDv inhalation unit Inhale 1 Puff into the lungs once daily 1 each 6  . VITAMIN C 500 MG tablet      No current facility-administered medications for this visit.    Problem List:  Patient Active Problem List  Diagnosis  . Asthma, severe persistent (CMS/HHS-HCC)  . Allergic rhinitis  . Eczema, unspecified  . Tobacco use  . Lower back pain  . Essential hypertension  . On prednisone  therapy  . GERD (gastroesophageal reflux disease)  . Type 2 diabetes mellitus without complication, without long-term current use of insulin  (CMS/HHS-HCC)  . Lymphedema of both lower extremities  . Asthma with COPD (chronic obstructive pulmonary disease) (CMS/HHS-HCC)  . OSA on CPAP    Allergies:  Banana; Coconut; Egg; Metformin; Peanut; Penicillins; Tomato; Pork derived (porcine); Pork/porcine containing products; Coconut oil, hydrogenated; and Dairy aid [lactase]  History:  Past Medical History:  Diagnosis Date  . Asthma without status asthmaticus (HHS-HCC)   . COPD (chronic obstructive pulmonary disease) (CMS/HHS-HCC) 06/2015   Lived in  [place]   . Eczema, unspecified   . GERD (gastroesophageal reflux disease)   . Hypertension   . OSA on CPAP 10/08/2023  . Tobacco use   . Type 2 diabetes mellitus without complication, without long-term current use of insulin  (CMS/HHS-HCC) 06/21/2022    Past Surgical History:  Procedure Laterality Date  . Right olecranon bursectomy, Right elbow debridement including deep fascia and subcutaneous tissue Right 03/25/2022   Dr. Tobie    Family History  Problem Relation  Name Age of Onset  . Bipolar disorder Mother Joseph [name]   . Asthma Mother Joseph [name]   . Asthma Sister Joseph [name]   . Asthma Brother Joseph [name]   . Asthma Son    . Diabetes type II Maternal Grandmother    . Asthma Sister Joseph [name]     [name]:  reports that he quit smoking about 6 years ago. His smoking use included cigarettes. He started smoking about 26 years ago. He has a 10 pack-year smoking history. He has never used smokeless tobacco. He reports that he does not currently use alcohol . He reports current drug use. Frequency: 2.00 times per week. Drug: Other-see comments.  Review of System: As per above. All systems were reviewed with him in totality. Again, no other cardiopulmonary symptoms. No nausea, vomiting, diarrhea, blood in stools,dysuria or flank pain. No GERD, dysphagia, choking spells, polyphagia, polydipsia, heat or cold intolerance, rashes, lymph nodes, bleeding, seizures, TIAs, passing out spells, suicidaly ideation, proximal muscle tenderness, joint effusions. + sleep apnea.  Physical Exam: BP 114/75   Pulse 102   Wt 72.8 kg (160 lb 7.9 oz)   SpO2 92% Comment: room air  BMI 26.71 kg/m  72.8 kg (160 lb 7.9 oz) 92% on room aior General:  NAD. Able to speak in complete sentences without cough or dyspneafacial exzema HEENT: Normocephalic, nontraumatic. Extraocular movements intact NECK: Supple. No JVD, nodes, thryomegaly CV: RRR no murmurs, gallops, rubs PULM: Normal respiratory effort,exp  wheezing ( chronic) ABD: Increase girth EXTREMITIES: No significant edema, cyanosis or Homans'signs SKIN: Fair turgor. No rashes LYMPHATIC: No nodes NEURO: No gross deficits PSYCH: Appropriate affect, alert, oriented   Diagnostics:  SPIROMETRY: FVC was 2.76 L, 70 % of predicted FEV1 was .93 L, 29 % of predicted FEV1/FVC ratio was  41 % of predicted FEF 25-75% liters per second was 11 % of predicted  LUNG VOLUMES: TLC was 120 % of predicted RV was 286  % of predicted  DIFFUSION CAPACITY: DLCO was 88 % of predicted DLCO/VA was 105 % of predicted   Good patient effort with good repeatability.  Interpretation: Severe obstruction per spirometry Lung volumes c/w hyperinflation Diffusion capacity is in normal range   Interpreting Physician Dr. Theotis FIFE was 2.91 liters, 76% of predicted/Post 3.36, 87%, 15% Change FEV1 was 1.20, 38% of predicted/Post 1.56, 49%, 30% Change FEV1 ratio was 39.25/Post 45.96 FEF 25-75% liters per second was 15% of predicted/Post 21%, 40% Change *Patient took personal Albuterol  inhaler 2 puffs for post spirometry   LUNG VOLUMES: TLC was 138% of predicted RV was 384% of predicted   DIFFUSION CAPACITY: DLCO was 84% of predicted DLCO/VA was 96% of predicted   Severe obstriction. Bronchodilator effect noted Lung volume c/w hyperinflation Diffusion capacity in normal range   FINDINGS:  Cardiovascular: Normal heart size. Trace amount of pericardial  fluid, presumably physiologic. No evidence of thoracic aortic  aneurysm or dissection on this nongated examination. Although this  examination was not tailored for the evaluation the pulmonary  arteries, there are no discrete filling defects within the central  pulmonary arterial tree to suggest central pulmonary embolism.  Normal caliber of the main pulmonary artery.   Mediastinum/Nodes: No bulky mediastinal, hilar or axillary lymph  adenopathy.   Lungs/Pleura: Moderate apical predominant centrilobular and  paraseptal emphysematous change.   Nonocclusive debris is seen within the main tracheal air column.  There is circumferential wall thickening and short-segment occlusion  predominantly involving the bilateral lower lobe segmental and  subsegmental bronchi.   Scattered ill-defined opacities are seen within the right upper lobe  with dominant ill-defined consolidative opacity seen within the  right upper lobe measuring approximally 1.8 x 1.7 x  1.6 cm (axial  image 36, series 3; coronal image 86, series 5) with smaller  consolidative opacities seen on images 46 and 72, series 3).   Minimal right lower lobe subsegmental atelectasis, potentially the  sequela of previous multiple right-sided rib fractures. No air  bronchograms. No pleural effusion or pneumothorax.   Punctate granuloma within the right upper lobe (image 40, series 3).  No discrete worrisome pulmonary nodules given background parenchymal  abnormalities.   Upper Abdomen: Limited early arterial phase evaluation of the upper  abdomen is unremarkable.   Musculoskeletal: Old rib fractures are seen bilaterally including  the right sixth through eleventh ribs as well as the left ninth rib,  several of which contain pseudoarthroses. Increased sclerosis are  noted about several thoracic vertebral body endplates. DDD of C6-C7  with disc space height loss endplate irregularity and sclerosis.  Regional soft tissues are normal. Normal appearance of the thyroid  gland.   IMPRESSION:  1. Circumferential wall thickening and short-segment occlusion  predominantly affecting the bilateral lower lobe segmental and  subsegmental bronchi most suggestive of airways disease with  suspected developing areas of potential airways disseminated  infection within the right upper lobe. Further evaluation with  bronchoscopy could be performed as indicated. Follow-up chest CT  after resolution of acute symptoms could be performed to ensure  complete resolution and/or to establish a new baseline.  2.  Emphysema (ICD10-J43.9).  3. Multiple old rib fractures seen bilaterally, right greater than  left, several of which demonstrates pseudoarthroses.  ( Due to coughing)  Electronically Signed    By: Norleen Roulette M.D.    On: 08/29/2021 12:19    Eosinophil % 1.0 - 5.0 % 0.1 Low      06/29/22 Immunoglobulin E, Total - [facility] 6 - 495 IU/mL 71    prior gE 4 - 269 IU/mL 1,120 High  725  <redacted file path> High <redacted file path>  1,340 <redacted file path>    Alpha-1 Antitrypsin 90 - 200 mg/dL 861 869 <redacted file path> R,    Alpha 1 Antitrypsin Phenotype bands MM      Component Ref Range & Units 2 wk ago     Class Description - [facility] Comment  Comment:     Levels of Specific IgE       Class  Description of Class     ---------------------------  -----  --------------------                    < 0.10         0         Negative            0.10 -    0.31         0/I       Equivocal/Low  0.32 -    0.55         I         Low            0.56 -    1.40         II        Moderate            1.41 -    3.90         III       High            3.91 -   19.00         IV        Very High           19.01 -  100.00         V         Very High                   >100.00         VI        Very High   D001-IgE D pteronyssinus - [facility] Class 0 kU/L <0.10   D002-IgE D farinae Mite - [facility] Class 0/I kU/L 0.11 Abnormal    E001-IgE Cat Hair/Dander - [facility] Class 0/I kU/L 0.23 Abnormal    E005-IgE Dog Hair/Dander - [facility] Class III kU/L 3.74 Abnormal    G002-IgE [place] Grass - [facility] Class 0/I kU/L 0.17 Abnormal    G008-IgE [place], [place]  - [facility] Class 0/I kU/L 0.13 Abnormal    G010-IgE [place] Grass - [facility] Class 0/I kU/L 0.14 Abnormal    G017-IgE [place] Grass - [facility] Class 0/I kU/L 0.16 Abnormal    I206-IgE Cockroach, American - [facility] Class 0 kU/L <0.10   M001-IgE Penicillium chrysogen - [facility] Class 0 kU/L <0.10   M002-IgE Cladosporium herbarum - [facility] Class 0 kU/L <0.10   M003-IgE Aspergillus fumigatus - [facility] Class 0/I kU/L 0.28 Abnormal    M004-IgE Mucor racemosus - [facility] Class 0 kU/L <0.10   M006-IgE Alternaria alternata - [facility] Class 0/I kU/L 0.11 Abnormal    M010-IgE Stemphylium herbarum - [facility] Class 0/I kU/L 0.12 Abnormal    T003-IgE Common Silver [place] - [facility] Class II kU/L 0.80 Abnormal    T007-IgE Oak, White -  [facility] Class I kU/L 0.54 Abnormal    T008-IgE Elm, American - [facility] Class 0/I kU/L 0.18 Abnormal    T001-IgE Maple/[place] - [facility] Class 0/I kU/L 0.17 Abnormal    T041-IgE H[place]ory, White - [facility] Class 0/I kU/L 0.13 Abnormal    T070-IgE White Mulberry - [facility] Class 0/I kU/L 0.10 Abnormal    T006-IgE [place], [place] - [facility] Class 0/I kU/L 0.14 Abnormal    W001-IgE Ragweed, Short - [facility] Class 0/I kU/L 0.12 Abnormal    W009-IgE Plantain, English - [facility] Class 0/I kU/L 0.10 Abnormal    W014-IgE Pigweed, Rough - [facility] Class 0/I kU/L 0.21 Abnormal    W020-IgE Nettle - [facility] Class 0 kU/L <0.10    FINDINGS:  Cardiovascular: Normal heart size. Trace amount of pericardial  fluid, presumably physiologic. No evidence of thoracic aortic  aneurysm or dissection on this nongated examination. Although this  examination was not tailored for the evaluation the pulmonary  arteries, there are no discrete filling defects within the central  pulmonary arterial tree to suggest central pulmonary embolism.  Normal caliber of the main pulmonary artery.   Mediastinum/Nodes: No bulky mediastinal, hilar  or axillary lymph  adenopathy.   Lungs/Pleura: Moderate apical predominant centrilobular and  paraseptal emphysematous change.   Nonocclusive debris is seen within the main tracheal air column.  There is circumferential wall thickening and short-segment occlusion  predominantly involving the bilateral lower lobe segmental and  subsegmental bronchi.   Scattered ill-defined opacities are seen within the right upper lobe  with dominant ill-defined consolidative opacity seen within the  right upper lobe measuring approximally 1.8 x 1.7 x 1.6 cm (axial  image 36, series 3; coronal image 86, series 5) with smaller  consolidative opacities seen on images 46 and 72, series 3).   Minimal right lower lobe subsegmental atelectasis, potentially the  sequela of previous multiple right-sided  rib fractures. No air  bronchograms. No pleural effusion or pneumothorax.   Punctate granuloma within the right upper lobe (image 40, series 3).  No discrete worrisome pulmonary nodules given background parenchymal  abnormalities.   Upper Abdomen: Limited early arterial phase evaluation of the upper  abdomen is unremarkable.   Musculoskeletal: Old rib fractures are seen bilaterally including  the right sixth through eleventh ribs as well as the left ninth rib,  several of which contain pseudoarthroses. Increased sclerosis are  noted about several thoracic vertebral body endplates. DDD of C6-C7  with disc space height loss endplate irregularity and sclerosis.  Regional soft tissues are normal. Normal appearance of the thyroid  gland.   IMPRESSION:  1. Circumferential wall thickening and short-segment occlusion  predominantly affecting the bilateral lower lobe segmental and  subsegmental bronchi most suggestive of airways disease with  suspected developing areas of potential airways disseminated  infection within the right upper lobe. Further evaluation with  bronchoscopy could be performed as indicated. Follow-up chest CT  after resolution of acute symptoms could be performed to ensure  complete resolution and/or to establish a new baseline.  2.  Emphysema (ICD10-J43.9).  3. Multiple old rib fractures seen bilaterally, right greater than  left, several of which demonstrates pseudoarthroses.  ( Due to coughing)  Electronically Signed    By: Norleen Roulette M.D.    On: 08/29/2021 12:19    IMPRESSION:  1. Chronic marked diffuse bronchial wall thickening with mild  paraseptal and centrilobular emphysema, compatible with provided  history of COPD and reactive airways disease.  2. Chronic scattered mild varicoid bronchiectasis in both lungs,  most prominent in the right upper lobe, stable.  3. Persistent mild patchy diffuse peribronchovascular ground-glass  opacity and indistinct  centrilobular micronodular opacities, overall  improved since 08/29/2021 chest CT. At least 2 new indistinct small  nodular opacities measuring up to 1.1 cm in the posterior basilar  right lower lobe. Favor infectious/inflammatory opacities. Suggest  attention on follow-up high-resolution chest CT in 3-6 months in  this high risk patient.  4. Small hiatal hernia.  5. Aortic Atherosclerosis (ICD10-I70.0) and Emphysema (ICD10-J43.9).    Electronically Signed    By: Selinda DELENA Blue M.D.    On: 10/24/2022 09:00      Cxr 5/24 no acute findings noted.    Component Ref Range & Units 1 yr ago Comments  C-ANCA Neg:<1:20 titer <1:20    P-ANCA Neg:<1:20 titer <1:20      Component Ref Range & Units 1 yr ago Comments  ds DNA Ab 0 - 9 IU/mL <1 (NOTE)  Negative      <5                                   Equivocal  5 - 9                                   Positive      >9  Ribonucleic Protein 0.0 - 0.9 AI <0.2    ENA SM Ab Ser-aCnc 0.0 - 0.9 AI <0.2    Scleroderma (Scl-70) (ENA) Antibody, IgG 0.0 - 0.9 AI <0.2    SSA (Ro) (ENA) Antibody, IgG 0.0 - 0.9 AI <0.2    SSB (La) (ENA) Antibody, IgG 0.0 - 0.9 AI <0.2    Chromatin Ab SerPl-aCnc 0.0 - 0.9 AI <0.2    Anti JO-1 0.0 - 0.9 AI <0.2    Centromere Ab Screen 0.0 - 0.9 AI <0.2    See below: Commen      Fungitell Result <80 pg/mL <31    Cryptococcus Antigen, Serum Negative Negative    Histoplasma [place] Ag Ser <0.5 ng/mL <0.5    Aspergillus Ag, BAL/Serum 0.00 - 0.49 Index 0.35    Component Ref Range & Units 1 yr ago Comments  IgE (Immunoglobulin E), Serum 6 - 495 IU/mL 341      HIV Screen 4th Generation wRfx Non Reactive Non Reactive    L. pneumophila Serogp 1 Ur Ag Negative Negative      EXAM:  PORTABLE CHEST 1 VIEW   COMPARISON:  August 29, 2021.  October 13, 2022.   FINDINGS:  The heart size and mediastinal contours are within normal limits.  Both lungs are clear. Old  bilateral rib fractures are noted.   IMPRESSION:  No active disease.    Electronically Signed    By: Lynwood Landy Raddle M.D.    On: 01/17/2023 09:57    IMPRESSIONS   1. Left ventricular ejection fraction, by estimation, is 60 to 65%. The left ventricle has normal function. The left ventricle has no regional wall motion abnormalities. Left ventricular diastolic parameters were normal.   2. Right ventricular systolic function is normal. The right ventricular size is normal.   3. The mitral valve is normal in structure. No evidence of mitral valve regurgitation. No evidence of mitral stenosis.   4. The aortic valve is normal in structure. Aortic valve regurgitation is not visualized. No aortic stenosis is present.    Culture STAPHYLOCOCCUS AUREUS Abnormal   Report Status 01/20/2023 FINAL  Organism ID, Bacteria STAPHYLOCOCCUS AUREUS  Resulting Agency [facility] <redacted file path>  Susceptibility   Organism Antibiotic Method Susceptibility  Staphylococcus aureus CIPROFLOXACIN MIC <=0.5 SENSITIVE: Sensitive  Staphylococcus aureus ERYTHROMYCIN MIC >=8 RESISTANT: Resistant  Staphylococcus aureus GENTAMICIN MIC <=0.5 SENSITIVE: Sensitive  Staphylococcus aureus OXACILLIN MIC <=0.25 SENSITIVE: Sensitive  Staphylococcus aureus TETRACYCLINE MIC <=1 SENSITIVE: Sensitive  Staphylococcus aureus VANCOMYCIN  MIC 1 SENSITIVE: Sensitive  Staphylococcus aureus TRIMETH /SULFA  MIC <=10 SENSITIVE: Sensitive  Staphylococcus aureus CLINDAMYCIN MIC RESISTANT: Resistant  Staphylococcus aureus RIFAMPIN MIC <=0.5 SENSITIVE: Sensitive  Staphylococcus aureus Inducible Clindamycin MIC POSITIVE: Resistant  Staphylococcus aureus LINEZOLID MIC 2 SENSITIVE: Sensitive    Component Ref Range & Units 11 d ago Comments  Sed Rate 0 - 15 mm/hr 73 High       Vitamin D, 25-Hydroxy - [facility] 30.0 - 100.0 ng/mL  22.0 Low          FINDINGS:  Cardiovascular: Heart size is normal. No pericardial  effusion.  Aortic atherosclerosis.   Mediastinum/Nodes: Thyroid gland, trachea and esophagus are  unremarkable. No enlarged axillary, supraclavicular, mediastinal or  hilar lymph nodes.   Lungs/Pleura: Emphysema. Diffuse bronchial wall thickening is again  noted. Mild scattered areas of varicoid bronchiectasis is again  identified. This is most notable within the right upper lobe. No  pleural effusion or airspace consolidation. No pneumothorax  identified.   Persistent, progressive multifocal bilateral patchy areas of  ground-glass attenuation which has a predominantly  peribronchovascular distribution.   Indistinct nodular density within the right lung base is obscured by  new area of subsegmental atelectasis, image 110/3. Calcified  granuloma identified within the periphery of the right upper lobe.   Upper Abdomen: No acute abnormality. Simple appearing cyst off the  upper pole of right kidney measures 1.3 cm. No follow-up imaging  recommended. Multiple chronic healed right rib fracture deformities  are again seen.   Musculoskeletal: No chest wall mass or suspicious bone lesions  identified.   IMPRESSION:  1. Persistent, progressive multifocal bilateral patchy areas of  ground-glass attenuation which have a peribronchovascular  distribution. There also chronic changes of emphysema with diffuse  bronchial wall thickening compatible with COPD and mild varicoid  bronchiectasis. Constellation of findings are favored to represent  an inflammatory or infectious process. Differential is broad  including idiopathic interstitial pneumonias such as NSIP, DIP or  RB-ILD. Drug toxicity as well as chronic atypical infection are also  potential diagnostic considerations. Consider further evaluation  with high-resolution CT of the chest.  2. Aortic Atherosclerosis (ICD10-I70.0) and Emphysema (ICD10-J43.9).   Electronically Signed    By: Waddell Calk M.D.    On: 03/01/2023 14:49    IMPRESSION:  Stable changes of COPD similar to that seen on the prior exam.   Previously seen patchy areas of ground-glass opacification have  improved significantly in the interval from the prior exam  consistent with a resolving inflammatory process.   No other focal abnormality is noted.   Aortic Atherosclerosis (ICD10-I70.0) and Emphysema (ICD10-J43.9).    Electronically Signed    By: Oneil Devonshire M.D.    On: 08/06/2023 21:37      QuantiFERON Incubation - [facility] Incubation performed.   QuantiFERON Criteria - [facility] Comment  Comment: QuantiFERON-TB Gold Plus is a Chartered certified accountant for M tuberculosis infection (including disease) and is intended for use in conjunction with risk assessment, radiography, and other medical and diagnostic evaluations. The QuantiFERON-TB Gold Plus result is determined by subtracting the Nil value from either TB antigen (Ag) value. The Mitogen tube serves as a control for the test.   QuantiFERON TB1 Ag Value - [facility] IU/mL 0.00   QuantiFERON TB2 Ag Value - [facility] IU/mL 0.00   QuantiFERON Nil Value - [facility] IU/mL 0.00   QuantiFERON Mitogen Value - [facility] IU/mL 0.95   QuantiFERON TB Gold - [facility] Negative Negative      Component Ref Range & Units 3 mo ago     A.Fumigatus #1 Abs - [facility] Negative Negative   Micropolyspora faeni, IgG - [facility] Negative Negative   Thermoactinomyces vulgaris, IgG - [facility] Negative Negative   A. Pullulans Abs - [facility] Negative Negative   Thermoact. Saccharii - [facility] Negative Negative   Pigeon Serum Abs - [facility] Negative Negative    RA Latex Turbid. - [facility] <14.0 IU/mL <10.0    ANA Direct - [facility]  Negative Negative    Cr 1.1   Alpha-1 Antitrypsin 138 130 <redacted file path> R,     impression/ PLAN Hx of severe asthma, still smoking, on dupixent, suspect underlying emphysema.. MM phenotype. Has a cough today -continue albuterol , singulair  10 mg q hs, dupixent bi -  monthly( last 3 weeks ago), incruse once a day, wixella 250/50 one puff q 12 hrs, prednisone  10 mg q day -doxycycline  100 mg q 12 hrs x 7 days -maintain ideal weight loss -stop smoking advised -f/u in 14 weeks    2. Repeat chest ct showed significant improvement, no sizable nodules  Repeat chest ct 6/26 Advised to stop smoking   3. Osa on auto cpap with positive response.  -auto cpap 5-20 cm h20

## 2023-11-16 ENCOUNTER — Emergency Department: Admission: EM | Admit: 2023-11-16 | Discharge: 2023-11-16 | Disposition: A | Discharge status: Home / Self Care

## 2023-11-16 ENCOUNTER — Emergency Department

## 2023-11-16 ENCOUNTER — Other Ambulatory Visit: Payer: Self-pay

## 2023-11-16 DIAGNOSIS — M5412 Radiculopathy, cervical region: Secondary | ICD-10-CM | POA: Diagnosis not present

## 2023-11-16 DIAGNOSIS — Z720 Tobacco use: Secondary | ICD-10-CM | POA: Insufficient documentation

## 2023-11-16 DIAGNOSIS — I10 Essential (primary) hypertension: Secondary | ICD-10-CM | POA: Diagnosis not present

## 2023-11-16 DIAGNOSIS — J45909 Unspecified asthma, uncomplicated: Secondary | ICD-10-CM | POA: Diagnosis not present

## 2023-11-16 DIAGNOSIS — D72829 Elevated white blood cell count, unspecified: Secondary | ICD-10-CM | POA: Insufficient documentation

## 2023-11-16 DIAGNOSIS — J449 Chronic obstructive pulmonary disease, unspecified: Secondary | ICD-10-CM | POA: Insufficient documentation

## 2023-11-16 DIAGNOSIS — M5416 Radiculopathy, lumbar region: Secondary | ICD-10-CM | POA: Insufficient documentation

## 2023-11-16 DIAGNOSIS — M542 Cervicalgia: Secondary | ICD-10-CM | POA: Diagnosis present

## 2023-11-16 LAB — CBC WITH DIFFERENTIAL/PLATELET
Abs Immature Granulocytes: 0.16 K/uL — ABNORMAL HIGH (ref 0.00–0.07)
Basophils Absolute: 0.1 K/uL (ref 0.0–0.1)
Basophils Relative: 1 %
Eosinophils Absolute: 0.3 K/uL (ref 0.0–0.5)
Eosinophils Relative: 1 %
HCT: 43.3 % (ref 39.0–52.0)
Hemoglobin: 13.9 g/dL (ref 13.0–17.0)
Immature Granulocytes: 1 %
Lymphocytes Relative: 6 %
Lymphs Abs: 1.2 K/uL (ref 0.7–4.0)
MCH: 26 pg (ref 26.0–34.0)
MCHC: 32.1 g/dL (ref 30.0–36.0)
MCV: 80.9 fL (ref 80.0–100.0)
Monocytes Absolute: 1.3 K/uL — ABNORMAL HIGH (ref 0.1–1.0)
Monocytes Relative: 6 %
Neutro Abs: 16.8 K/uL — ABNORMAL HIGH (ref 1.7–7.7)
Neutrophils Relative %: 85 %
Platelets: 472 K/uL — ABNORMAL HIGH (ref 150–400)
RBC: 5.35 MIL/uL (ref 4.22–5.81)
RDW: 15.9 % — ABNORMAL HIGH (ref 11.5–15.5)
WBC: 19.8 K/uL — ABNORMAL HIGH (ref 4.0–10.5)
nRBC: 0 % (ref 0.0–0.2)

## 2023-11-16 LAB — BASIC METABOLIC PANEL WITH GFR
Anion gap: 9 (ref 5–15)
BUN: 17 mg/dL (ref 6–20)
CO2: 28 mmol/L (ref 22–32)
Calcium: 9 mg/dL (ref 8.9–10.3)
Chloride: 102 mmol/L (ref 98–111)
Creatinine, Ser: 0.86 mg/dL (ref 0.61–1.24)
GFR, Estimated: >60 mL/min (ref >60)
Glucose, Bld: 105 mg/dL — ABNORMAL HIGH (ref 70–99)
Potassium: 3.9 mmol/L (ref 3.5–5.1)
Sodium: 139 mmol/L (ref 135–145)

## 2023-11-16 LAB — LACTIC ACID, PLASMA: Lactic Acid, Venous: 0.7 mmol/L (ref 0.5–1.9)

## 2023-11-16 MED ORDER — GADOBUTROL 1 MMOL/ML IV SOLN
6.0000 mL | Freq: Once | INTRAVENOUS | Status: AC | PRN
  Administered 2023-11-16: 6 mL via INTRAVENOUS

## 2023-11-16 MED ORDER — NAPROXEN 500 MG PO TABS: 500.0000 mg | ORAL_TABLET | Freq: Two times a day (BID) | ORAL | 0 refills | Status: AC

## 2023-11-16 MED ORDER — KETOROLAC TROMETHAMINE 15 MG/ML IJ SOLN
15.0000 mg | Freq: Once | INTRAMUSCULAR | Status: AC
  Administered 2023-11-16: 15 mg via INTRAVENOUS
  Filled 2023-11-16: qty 1

## 2023-11-16 NOTE — ED Provider Notes (Signed)
 [facility] Provider Note    Event Date/Time   First MD Initiated Contact with Patient 11/16/23 1011     (approximate)   History   Back Pain   HPI  Joseph [name] is a 43 y.o. male with a past medical history of severe persistent asthma/COPD overlap syndrome on 2 L of home O2, steroid-dependent, also on dupilumab who presents today for evaluation of back pain and neck pain.  Patient reports that he has chronic pain in these locations, but reports that his pain has worsened over the last couple of days.  He feels like he has had a fever but he has not taken his temperature.  He denies chest pain or abdominal pain.  He reports that he has not had any paresthesias or weakness in his extremities.  He reports that he has had an infection in his spine in the past and is worried that the same thing is happening now.  Patient Active Problem List   Diagnosis Date Noted   Thrombocytosis 02/02/2023   Family history of ovarian cancer 02/02/2023   MSSA bacteremia 01/19/2023   Acute deep vein thrombosis (DVT) of iliac vein of left lower extremity (HCC) 01/17/2023   Acute midline low back pain without sciatica 01/17/2023   SIRS (systemic inflammatory response syndrome) (HCC) 01/17/2023   Degeneration of intervertebral disc of lumbar region with discogenic back pain 01/17/2023   Cellulitis of right upper extremity 03/15/2022   Septic bursitis of elbow, right 03/15/2022   Leukocytosis 08/29/2021   Lymphedema of both lower extremities 08/29/2021   OSA (obstructive sleep apnea) 08/23/2021   COPD with acute exacerbation (HCC) 08/21/2021   COPD exacerbation (HCC) 08/07/2021   Acute asthma exacerbation 12/10/2020   COPD with acute bronchitis (HCC) 12/10/2020   Chronic respiratory failure (HCC) 12/10/2020   Essential hypertension 12/10/2020   GERD (gastroesophageal reflux disease) 12/10/2020   Sepsis (HCC) 12/05/2017   Prediabetes 09/24/2014   Tobacco use 08/20/2013    Severe persistent asthma (HCC) 12/04/2012          Physical Exam   Triage Vital Signs: ED Triage Vitals  Encounter Vitals Group     BP 11/16/23 1006 114/83     Girls Systolic BP Percentile --      Girls Diastolic BP Percentile --      Boys Systolic BP Percentile --      Boys Diastolic BP Percentile --      Pulse Rate 11/16/23 1006 93     Resp 11/16/23 1006 (!) 21     Temp 11/16/23 1006 98.2 F (36.8 C)     Temp src --      SpO2 11/16/23 1005 95 %     Weight 11/16/23 1006 149 lb (67.6 kg)     Height 11/16/23 1006 5' 5 (1.651 m)     Head Circumference --      Peak Flow --      Pain Score 11/16/23 1006 8     Pain Loc --      Pain Education --      Exclude from Growth Chart --     Most recent vital signs: Vitals:   11/16/23 1006 11/16/23 1442  BP: 114/83 120/80  Pulse: 93 88  Resp: (!) 21 20  Temp: 98.2 F (36.8 C) 98 F (36.7 C)  SpO2:  96%    Physical Exam Vitals and nursing note reviewed.  Constitutional:      General: Awake and alert. No acute distress.  Appearance: Normal appearance. The patient is normal weight.  HENT:     Head: Normocephalic and atraumatic.     Mouth: Mucous membranes are moist.  Eyes:     General: PERRL. Normal EOMs        Right eye: No discharge.        Left eye: No discharge.     Conjunctiva/sclera: Conjunctivae normal.  Cardiovascular:     Rate and Rhythm: Normal rate and regular rhythm.     Pulses: Normal pulses.  Pulmonary:     Effort: Pulmonary effort is normal. No respiratory distress.  On home O2    Breath sounds: Normal breath sounds.  Abdominal:     Abdomen is soft. There is no abdominal tenderness. No rebound or guarding. No distention. Musculoskeletal:        General: No swelling. Normal range of motion.     Cervical back: Normal range of motion and neck supple.  No midline cervical spine tenderness.  Full range of motion of neck.  Negative Hoffmann sign.  Normal strength and sensation in bilateral upper  extremities. Normal grip strength bilaterally.  Normal intrinsic muscle function of the hand bilaterally.  Normal radial pulses bilaterally. Back: No midline tenderness. Strength and sensation 5/5 to bilateral lower extremities though pain with range of motion on the left side. Normal great toe extension against resistance. Normal sensation throughout feet. Normal patellar reflexes.  Discomfort with SLR on the left and negative opposite SLR bilaterally.  Skin:    General: Skin is warm and dry.     Capillary Refill: Capillary refill takes less than 2 seconds.     Findings: No rash.  Neurological:     Mental Status: The patient is awake and alert.      ED Results / Procedures / Treatments   Labs (all labs ordered are listed, but only abnormal results are displayed) Labs Reviewed  CBC WITH DIFFERENTIAL/PLATELET - Abnormal; Notable for the following components:      Result Value   WBC 19.8 (*)    RDW 15.9 (*)    Platelets 472 (*)    Neutro Abs 16.8 (*)    Monocytes Absolute 1.3 (*)    Abs Immature Granulocytes 0.16 (*)    All other components within normal limits  BASIC METABOLIC PANEL WITH GFR - Abnormal; Notable for the following components:   Glucose, Bld 105 (*)    All other components within normal limits  CULTURE, BLOOD (ROUTINE X 2)  CULTURE, BLOOD (ROUTINE X 2)  LACTIC ACID, PLASMA  LACTIC ACID, PLASMA     EKG     RADIOLOGY I independently reviewed and interpreted imaging and agree with radiologists findings.     PROCEDURES:  Critical Care performed:   Procedures   MEDICATIONS ORDERED IN ED: Medications  gadobutrol  (GADAVIST ) 1 MMOL/ML injection 6 mL (6 mLs Intravenous Contrast Given 11/16/23 1202)  ketorolac  (TORADOL ) 15 MG/ML injection 15 mg (15 mg Intravenous Given 11/16/23 1503)     IMPRESSION / MDM / ASSESSMENT AND PLAN / ED COURSE  I reviewed the triage vital signs and the nursing notes.   Differential diagnosis includes, but is not limited to,  recurrence of discitis/osteomyelitis, lumbar radiculopathy, cervical radiculopathy  I reviewed the patient's chart.  Patient was hospitalized from 01/17/2023 until 01/25/2023 for L4-S1 osteomyelitis noted on CT but not MRI with blood cultures growing MSSA.  ID was consulted and agreed with concern for discitis.  MRI visit at that time revealed osteonecrosis and tendinosis without  source of infection.  The source of bacteremia was unclear but he received 4 to 6 weeks of antibiotics through PICC line.  Patient is awake and alert, hemodynamically stable and afebrile.  He has tenderness along his cervical, thoracic, and lumbar spine.  Labs obtained overall reassuring with the exception of a leukocytosis to 19.  Per chart review, patient always has leukocytosis, has a diagnosis of chronic leukocytosis and has followed up with hematology/oncology which they feel is likely reactive due to chronic steroid use.  He had a bone marrow biopsy.  Given his comorbidities, his chronic immunosuppression, his previous discitis/osteomyelitis, and his acute on chronic pain, MRI of the entire spine was obtained to evaluate for discitis/osteomyelitis as well as potential skip lesions.  He was treated symptomatically with Toradol  with good effect.  MRIs revealed multilevel degenerative changes, though no recurrence of discitis or osteomyelitis.  Patient is reassured by this.  Given the improvement of his symptoms with the Toradol  he was given a prescription for naproxen.  He was advised that he should not take this with other NSAIDs.  He was also advised that he should follow-up with neurosurgery.  The appropriate follow-up information was provided.  We discussed return precautions in the meantime.  Patient understands and agrees with plan.  He was discharged in stable condition.  Patient was discussed with and also seen by Dr. Dorothyann who agrees with assessment and plan.  Patient's presentation is most consistent with acute  presentation with potential threat to life or bodily function.    FINAL CLINICAL IMPRESSION(S) / ED DIAGNOSES   Final diagnoses:  Lumbar radiculopathy  Cervical radiculopathy     Rx / DC Orders   ED Discharge Orders          Ordered    naproxen (NAPROSYN) 500 MG tablet  2 times daily with meals        11/16/23 1517             Note:  This document was prepared using Dragon voice recognition software and may include unintentional dictation errors.   Jenesis Martin E, [name] 11/16/23 1523    Dorothyann Drivers, MD 11/16/23 1914

## 2023-11-16 NOTE — Discharge Instructions (Addendum)
 You may take the medication to help with your symptoms.  Do not take with other NSAIDs.  Please follow-up with spine surgery.  Please refrain from heavy lifting or bending from the waist.  Please return for any new, worsening, or changing symptoms or other concerns.  It was a pleasure caring for you today.

## 2023-11-16 NOTE — ED Triage Notes (Signed)
 Pt to ED for back pain worsening with standing.

## 2023-11-21 LAB — CULTURE, BLOOD (ROUTINE X 2)
Culture: NO GROWTH
Culture: NO GROWTH
Special Requests: ADEQUATE

## 2023-12-13 NOTE — Progress Notes (Addendum)
 Referring Physician:  Cyrus Selinda Moose, [name] [id number] [place],  [place] [address]  Primary Physician:  Cyrus Selinda Moose, [name]  History of Present Illness: 12/18/2023 Joseph [name] has a history of HTN, DVT, asthma, COPD, OSA, GERD, lymphedema of both lower extremities, prediabetes.   He has chronic constant left sided mid to lower back pain with left lateral leg pain to his foot x years. Worse pain in in his upper lumbar spine. Back pain > left leg pain. No right leg pain. Pain starts at shoulder blades. Pain is worse with prolonged standing and sitting. Some relief with laying flat.   Tobacco use: smokes 1/2 PPD x 30 years.   Bowel/Bladder Dysfunction: none  Conservative measures:  Physical therapy: has participated in PT for lumbar spine at [facility] 04/03/23-08/15/23, helped short term.  Multimodal medical therapy including regular antiinflammatories: Celebrex , Hydrocodone , Naproxen, Flexeril , Prednisone  Injections:  had 1 epidural steroid injection a few years ago in the back didn't help.   Past Surgery: no spine surgery  Joseph [name] has no symptoms of cervical myelopathy.  The symptoms are causing a significant impact on the patient's life.   Review of Systems:  A 10 point review of systems is negative, except for the pertinent positives and negatives detailed in the HPI.  Past Medical History: Past Medical History:  Diagnosis Date   Asthma    COPD (chronic obstructive pulmonary disease) (HCC)    Diabetes mellitus without complication (HCC)    takes Jardiance   Eczema    Hypertension     Past Surgical History: Past Surgical History:  Procedure Laterality Date   ABDOMINAL AORTOGRAM W/LOWER EXTREMITY Left 01/18/2023   Procedure: ABDOMINAL AORTOGRAM W/LOWER EXTREMITY;  Surgeon: Jama Cordella MATSU, MD;  Location: [facility] INVASIVE [facility];  Service: Cardiovascular;  Laterality: Left;   OLECRANON BURSECTOMY Right 03/16/2022   Procedure: I&D OLECRANON  (ELBOW) BURSA;  Surgeon: Tobie Priest, MD;  Location: [facility] ORS;  Service: Orthopedics;  Laterality: Right;    Allergies: Allergies as of 12/18/2023 - Review Complete 12/18/2023  Allergen Reaction Noted   Penicillins Shortness Of Breath 03/27/2017   Porcine (pork) protein-containing drug products Nausea Only 07/01/2014   Tomato Rash 01/17/2023   Banana Hives 03/27/2017   Coconut (cocos nucifera) Hives 03/15/2022    Medications: Outpatient Encounter Medications as of 12/18/2023  Medication Sig   albuterol  (PROVENTIL  HFA;VENTOLIN  HFA) 108 (90 Base) MCG/ACT inhaler Inhale 2 puffs into the lungs every 6 (six) hours as needed for wheezing or shortness of breath.   albuterol  (PROVENTIL ) (2.5 MG/3ML) 0.083% nebulizer solution Take 2.5 mg by nebulization every 6 (six) hours as needed for wheezing or shortness of breath.   dupilumab (DUPIXENT) 300 MG/2ML prefilled syringe Inject 300 mg into the skin every 14 (fourteen) days.   EPINEPHrine 0.3 mg/0.3 mL IJ SOAJ injection Inject 0.3 mLs into the muscle as needed.   fluticasone -salmeterol (ADVAIR) 250-50 MCG/ACT AEPB Inhale 1 Puff into the lungs every 12 (twelve) hours   furosemide  (LASIX ) 20 MG tablet Take 20 mg by mouth daily as needed for edema or fluid.   hydrochlorothiazide  (HYDRODIURIL ) 25 MG tablet Take 25 mg by mouth daily.   INCRUSE ELLIPTA  62.5 MCG/ACT AEPB Inhale 1 puff into the lungs daily.   JARDIANCE 10 MG TABS tablet Take 10 mg by mouth daily.   KERENDIA 10 MG TABS Take 1 tablet by mouth daily.   lidocaine  (LIDODERM ) 5 % Place 1 patch onto the skin daily. Remove & Discard patch within  12 hours or as directed by MD   losartan  (COZAAR ) 100 MG tablet Take 1 tablet (100 mg total) by mouth daily.   montelukast  (SINGULAIR ) 10 MG tablet Take 10 mg by mouth at bedtime.   Multiple Vitamin (MULTIVITAMIN WITH MINERALS) TABS tablet Take 1 tablet by mouth daily.   omeprazole (PRILOSEC) 20 MG capsule Take 20 mg by mouth daily as needed (GERD  symptoms).   OXYGEN  Inhale into the lungs.   predniSONE  (DELTASONE ) 10 MG tablet Take 10 mg by mouth daily with breakfast.   QUEtiapine  (SEROQUEL ) 25 MG tablet Take 1 tablet (25 mg total) by mouth at bedtime.   [DISCONTINUED] celecoxib  (CELEBREX ) 100 MG capsule Take 100 mg by mouth 2 (two) times daily.   [DISCONTINUED] cyclobenzaprine  (FLEXERIL ) 5 MG tablet Take 1 tablet (5 mg total) by mouth 3 (three) times daily as needed.   [DISCONTINUED] DULoxetine  (CYMBALTA ) 20 MG capsule Take 1 capsule (20 mg total) by mouth daily.   [DISCONTINUED] gabapentin  (NEURONTIN ) 300 MG capsule Take 300 mg by mouth 3 (three) times daily as needed (Neuropathy pain).   [DISCONTINUED] oxyCODONE  (OXY IR/ROXICODONE ) 5 MG immediate release tablet Take 1-2 tablets (5-10 mg total) by mouth every 6 (six) hours as needed for breakthrough pain.   [DISCONTINUED] senna-docusate (SENOKOT-S) 8.6-50 MG tablet Take 1 tablet by mouth at bedtime as needed for mild constipation.   [DISCONTINUED] tiZANidine  (ZANAFLEX ) 2 MG tablet Take 2 mg by mouth 3 (three) times daily.   No facility-administered encounter medications on file as of 12/18/2023.    Social History: Social History   Tobacco Use   Smoking status: Every Day    Current packs/day: 0.50    Average packs/day: 0.5 packs/day for 30.8 years (15.4 ttl pk-yrs)    Types: Cigarettes    Start date: 1995   Smokeless tobacco: Never  Vaping Use   Vaping status: Never Used  Substance Use Topics   Alcohol  use: No   Drug use: Yes    Types: Marijuana    Family Medical History: Family History  Problem Relation Age of Onset   Hypertension Mother    Glaucoma Mother    Cancer - Ovarian Sister     Physical Examination: Vitals:   12/18/23 1430  BP: 132/84    General: Patient is well developed, well nourished, calm, collected, and in no apparent distress. Attention to examination is appropriate.  Respiratory: Patient is breathing without any difficulty.   NEUROLOGICAL:      Awake, alert, oriented to person, place, and time.  Speech is clear and fluent. Fund of knowledge is appropriate.   Cranial Nerves: Pupils equal round and reactive to light.  Facial tone is symmetric.    He has diffuse left sided thoracic and lumbar tenderness.   No abnormal lesions on exposed skin.   Strength: Side Biceps Triceps Deltoid Interossei Grip Wrist Ext. Wrist Flex.  R 5 5 5 5 5 5 5   L 5 5 5 5 5 5 5    Side Iliopsoas Quads Hamstring PF DF EHL  R 5 5 5 5 5 5   L 5 5 5 5 5 5    Reflexes are 2+ and symmetric at the biceps, brachioradialis, patella and achilles.   Hoffman's is absent.  Clonus is not present.   Bilateral upper and lower extremity sensation is intact to light touch.     No pain with IR/ER of both hips.   He can heel/toe stand on right and left foot. Has minimal difficulty toe standing on  left.   He has a limp favoring left leg.   Medical Decision Making  Imaging: Cervical, Thoracic, and Lumbar MRI dated 11/16/23:  FINDINGS:   BONES AND ALIGNMENT: Cervical spine: Straightening of the cervical lordosis. Disc height loss and degenerative endplate changes at multiple levels. No bone marrow edema, evidence of discitis or osteomyelitis, fracture, or suspicious osseous lesion.   Thoracic spine: Degenerative endplate changes and Schmorl nodes at multiple levels, particularly within the mid and lower thoracic spine. Mild disc height loss at multiple levels. No new bone marrow edema, evidence of fracture, discitis, or osteomyelitis.   Lumbar spine: Standard segmentation. Straightening of the normal lumbar lordosis. Interval disc height loss at L5-S1 now with severe disc height loss, increased degenerative endplate changes at L5-S1 with fatty marrow changes. There is additional mild edema along the posterior aspect of the L5-S1 endplates and in the posterior aspect of the L5 vertebral body which is likely reactive in the setting of degenerative  changes. There is no definite signal abnormality within the disc. Mild enhancement in the L5 and S1 vertebral bodies also likely reactive in the setting of degenerative changes. There is also increased disc height loss at L4-L5 with slightly increased degenerative endplate changes. Schmorl nodes at multiple levels.   General: Multiple remote right-sided rib fractures.   SPINAL CORD: Cervical: Mild flattening of the ventral cord at C5-C6. Subtle flattening of the ventral cord at C7-T1.   Thoracic: Mild flattening of the ventral cord at T3-T4. Subtle flattening of the ventral cord at T7-T8.   Lumbar: The conus medullaris extends to the L1 level.   SOFT TISSUES: Redemonstrated enhancement along the margin of the L5-S1 disc extrusion. There is no evidence of epidural fluid collection. Exophytic cyst in the right kidney.   CERVICAL DISC LEVELS: C2-C3: No significant disc herniation. No spinal canal stenosis or neural foraminal narrowing.   C3-C4: Small disc bulge. Bilateral facet arthrosis and uncovertebral hypertrophy. No significant spinal canal stenosis. Mild bilateral foraminal stenosis.   C4-C5: Small disc bulge which indents the ventral thecal sac without contacting the spinal cord. Bilateral facet arthrosis and uncovertebral hypertrophy. Moderate right and mild left foraminal stenosis.   C5-C6: Diffuse disc bulge which indents the ventral thecal sac with mild flattening of the ventral cord. Thickening of the ligamentum flavum. Mild spinal canal stenosis. Bilateral facet arthrosis and uncovertebral hypertrophy. Moderate bilateral foraminal stenosis.   C6-C7: Diffuse disc bulge which indents the ventral thecal sac without contacting the spinal cord. Thickening of the ligamentum flavum. Mild spinal canal stenosis. Bilateral facet arthrosis and uncovertebral hypertrophy resulting in mild bilateral foraminal stenosis.   C7-T1: Small disc bulge. No  significant spinal canal stenosis. Bilateral facet arthrosis. Mild foraminal stenosis on the left.   THORACIC DISC LEVELS: Small disc bulges at multiple levels. No severe spinal canal stenosis. No high grade foraminal stenosis.   T3-T4: Right subarticular disc protrusion and indentation of the ventral thecal sac with mild flattening of the ventral cord.   T7-T8: Disc bulge and indentation of the ventral thecal sac with subtle flattening of the ventral cord.   T10-T11: Disc bulge and facet arthrosis resulting in mild spinal canal stenosis.   T11-T12: Disc bulge and facet arthrosis resulting in mild spinal canal stenosis.   LUMBAR DISC LEVELS: T12-L1: Small disc bulge and mild facet arthrosis. No significant spinal canal or foraminal stenosis.   L1-L2: Mild disc height loss. Small disc bulge. Right paracentral annular fissure. Mild lateral recess narrowing. Mild to moderate facet  arthrosis and thickening of the ligamentum flavum. No significant spinal canal or foraminal stenosis.   L2-L3: Moderate disc height loss. Diffuse disc bulge. Central annular fissure. Mild lateral recess narrowing. Mild to moderate facet arthrosis and thickening of the ligamentum flavum. Mild spinal canal stenosis. Mild foraminal stenosis on the left.   L3-L4: Moderate disc height loss. Diffuse disc bulge resulting in moderate lateral recess narrowing. Moderate facet arthrosis and thickening of the ligamentum flavum. Mild prominence of the dorsal epidural fat. Moderate spinal canal stenosis. Mild bilateral foraminal stenosis.   L4-L5: Progressive disc height loss. Diffuse disc bulge and left paracentral disc protrusion. Lateral recess narrowing bilaterally. Moderate facet arthrosis and thickening of the ligamentum flavum. Moderate to severe spinal canal stenosis. Moderate right and mild left foraminal stenosis.   L5-S1: Progressive disc height loss. Diffuse disc  bulge. Redemonstrated right paracentral disc extrusion which appears similar to slightly decreased from prior. Prominence of the epidural fat. Moderate bilateral facet arthrosis and thickening of the ligamentum flavum. Lateral recess narrowing with possible impingement of the traversing nerve roots. Moderate spinal canal stenosis which appears slightly improved from prior. Moderate right and moderate to severe left foraminal stenosis.   IMPRESSION: 1. No evidence of discitis or osteomyelitis in the cervical, thoracic, or lumbar spine. 2. No evidence of epidural fluid collection. 3. Severe disc height loss and degenerative endplate changes at L5-S1, increased from prior. Additional interval disc height loss at L4-5. Reactive edema and enhancement at L5-S1 likely related to degenerative changes. 4. Right paracentral disc extrusion at L5-S1, similar to slightly decreased from prior. 5. Additional degenerative changes as above. Foraminal stenosis at multiple levels, greatest and moderate to severe on the left at L5-S1.   Electronically signed by: Donnice Mania MD 11/16/2023 02:54 PM EDT RP Workstation: [id number]        I have personally reviewed the images and agree with the above interpretation.   EMG of left and right lower extremity dated 04/16/23:  Impression: This is an abnormal electrodiagnostic study consistent with generalized sensorimotor polyneuropathy.  Thank you for the referral of this patient. It was our privilege to participate in care of your patient. Feel free to contact us  with any further questions.  _____________________________ Jannett Fairly, M.D.   Assessment and Plan: Joseph [name] has chronic constant left sided mid to lower back pain with left lateral leg pain to his foot. Worse pain is in his upper lumbar spine. Back pain > left leg pain. No right leg pain. Pain starts at shoulder blades.   He has known thoracic spondylosis.   He has moderate central  stenosis at L3-L4 and L5-S1 with moderate/severe central stenosis L4-L5. He has significant DDD L4-S1 with multilevel foraminal stenosis and severe left foraminal stenosis L5-S1.   EMG from June showed generalized sensorimotor polyneuropathy.  Treatment options discussed with patient and following plan made:   - He has completed PT with no improvement.  - He is not interested in further injections.  - Lumbar xrays with flex/ext to be done on his way out.  - Will review imaging with one of the surgeons and message him with further recommendations. Would need to quit smoking prior to any surgical intervention.   I spent a total of 35 minutes in face-to-face and non-face-to-face activities related to this patient's care today including review of outside records, review of imaging, review of symptoms, physical exam, discussion of differential diagnosis, discussion of treatment options, and documentation.   Thank you for involving me in the  care of this patient.   ADDENDUM 12/24/23:  Lumbar xrays dated 12/18/23:  FINDINGS:   LUMBAR SPINE: BONES: 5 lumbar vertebral bodies are well visualized. Slight increase in scoliosis is noted worst at the L2-L3 level. Neutral imaging shows no anterolisthesis. Flexion and extension views show no significant instability.   DISCS AND DEGENERATIVE CHANGES: Disc space narrowing is noted worst at the L2-L3 level.   SOFT TISSUES: No soft tissue changes are seen.   IMPRESSION: 1. Slight increase in scoliosis, worst at L2-L3, with disc space narrowing at that level. 2. No significant instability on flexion and extension views.   Electronically signed by: Oneil Devonshire MD 12/23/2023 11:17 PM EST RP Workstation: [id number]  I have personally reviewed the images and agree with the above interpretation.   Will review patient and imaging with Dr. Clois and let him know regarding any surgical options.   Glade Boys [name] Dept. of Neurosurgery

## 2023-12-17 DIAGNOSIS — R519 Headache, unspecified: Secondary | ICD-10-CM

## 2023-12-17 HISTORY — DX: Headache, unspecified: R51.9

## 2023-12-18 ENCOUNTER — Ambulatory Visit

## 2023-12-18 ENCOUNTER — Encounter: Payer: Self-pay | Admitting: Orthopedic Surgery

## 2023-12-18 ENCOUNTER — Ambulatory Visit: Admitting: Orthopedic Surgery

## 2023-12-18 VITALS — BP 132/84 | Ht 65.0 in | Wt 171.4 lb

## 2023-12-18 DIAGNOSIS — M51362 Other intervertebral disc degeneration, lumbar region with discogenic back pain and lower extremity pain: Secondary | ICD-10-CM

## 2023-12-18 DIAGNOSIS — M47814 Spondylosis without myelopathy or radiculopathy, thoracic region: Secondary | ICD-10-CM

## 2023-12-18 DIAGNOSIS — M47816 Spondylosis without myelopathy or radiculopathy, lumbar region: Secondary | ICD-10-CM

## 2023-12-18 DIAGNOSIS — M4726 Other spondylosis with radiculopathy, lumbar region: Secondary | ICD-10-CM

## 2023-12-18 DIAGNOSIS — M5416 Radiculopathy, lumbar region: Secondary | ICD-10-CM

## 2023-12-18 DIAGNOSIS — M48061 Spinal stenosis, lumbar region without neurogenic claudication: Secondary | ICD-10-CM | POA: Diagnosis not present

## 2023-12-18 NOTE — Patient Instructions (Signed)
 It was so nice to see you today. Thank you so much for coming in.    You have wear and tear in your mid back.   You also have wear and tear in your lower back with degeneration of the discs and spinal stenosis.   I want to get lumbar xrays today.   I will review your imaging with one of my surgeons to see if you are a candidate for surgery. I will message you with further recommendations and we can regroup.   Please do not hesitate to call if you have any questions or concerns. You can also message me in MyChart.   Glade Boys [name] [phone]     The physicians and staff at [facility] at [facility] are committed to providing excellent care. You may receive a survey asking for feedback about your experience at our office. We value you your feedback and appreciate you taking the time to to fill it out. The [facility] leadership team is also available to discuss your experience in person, feel free to contact us  [phone].

## 2023-12-21 ENCOUNTER — Emergency Department

## 2023-12-21 ENCOUNTER — Other Ambulatory Visit: Payer: Self-pay

## 2023-12-21 ENCOUNTER — Emergency Department
Admission: EM | Admit: 2023-12-21 | Discharge: 2023-12-21 | Disposition: A | Discharge status: Home / Self Care | Attending: Emergency Medicine | Admitting: Emergency Medicine

## 2023-12-21 DIAGNOSIS — D72829 Elevated white blood cell count, unspecified: Secondary | ICD-10-CM | POA: Insufficient documentation

## 2023-12-21 DIAGNOSIS — R0602 Shortness of breath: Secondary | ICD-10-CM

## 2023-12-21 DIAGNOSIS — J441 Chronic obstructive pulmonary disease with (acute) exacerbation: Secondary | ICD-10-CM | POA: Insufficient documentation

## 2023-12-21 LAB — CBC
HCT: 46.2 % (ref 39.0–52.0)
Hemoglobin: 14.9 g/dL (ref 13.0–17.0)
MCH: 25.9 pg — ABNORMAL LOW (ref 26.0–34.0)
MCHC: 32.3 g/dL (ref 30.0–36.0)
MCV: 80.3 fL (ref 80.0–100.0)
Platelets: 445 K/uL — ABNORMAL HIGH (ref 150–400)
RBC: 5.75 MIL/uL (ref 4.22–5.81)
RDW: 17.2 % — ABNORMAL HIGH (ref 11.5–15.5)
WBC: 17.1 K/uL — ABNORMAL HIGH (ref 4.0–10.5)
nRBC: 0 % (ref 0.0–0.2)

## 2023-12-21 LAB — TROPONIN I (HIGH SENSITIVITY)
Troponin I (High Sensitivity): 4 ng/L (ref <18)
Troponin I (High Sensitivity): 3 ng/L (ref <18)

## 2023-12-21 LAB — BASIC METABOLIC PANEL WITH GFR
Anion gap: 10 (ref 5–15)
BUN: 14 mg/dL (ref 6–20)
CO2: 28 mmol/L (ref 22–32)
Calcium: 8.8 mg/dL — ABNORMAL LOW (ref 8.9–10.3)
Chloride: 100 mmol/L (ref 98–111)
Creatinine, Ser: 1.03 mg/dL (ref 0.61–1.24)
GFR, Estimated: >60 mL/min (ref >60)
Glucose, Bld: 102 mg/dL — ABNORMAL HIGH (ref 70–99)
Potassium: 4.6 mmol/L (ref 3.5–5.1)
Sodium: 138 mmol/L (ref 135–145)

## 2023-12-21 MED ORDER — IPRATROPIUM-ALBUTEROL 0.5-2.5 (3) MG/3ML IN SOLN
6.0000 mL | Freq: Once | RESPIRATORY_TRACT | Status: AC
  Administered 2023-12-21: 6 mL via RESPIRATORY_TRACT
  Filled 2023-12-21: qty 3

## 2023-12-21 MED ORDER — PREDNISONE 10 MG (21) PO TBPK: ORAL_TABLET | ORAL | 0 refills | Status: AC

## 2023-12-21 MED ORDER — DOXYCYCLINE HYCLATE 100 MG PO TABS
100.0000 mg | ORAL_TABLET | Freq: Once | ORAL | Status: AC
  Administered 2023-12-21: 100 mg via ORAL
  Filled 2023-12-21: qty 1

## 2023-12-21 MED ORDER — IPRATROPIUM-ALBUTEROL 0.5-2.5 (3) MG/3ML IN SOLN
6.0000 mL | Freq: Once | RESPIRATORY_TRACT | Status: AC
  Administered 2023-12-21: 6 mL via RESPIRATORY_TRACT
  Filled 2023-12-21: qty 6

## 2023-12-21 MED ORDER — DOXYCYCLINE HYCLATE 100 MG PO TABS: 100.0000 mg | ORAL_TABLET | Freq: Two times a day (BID) | ORAL | 0 refills | Status: AC

## 2023-12-21 MED ORDER — PREDNISONE 20 MG PO TABS
60.0000 mg | ORAL_TABLET | Freq: Once | ORAL | Status: AC
  Administered 2023-12-21: 60 mg via ORAL
  Filled 2023-12-21: qty 3

## 2023-12-21 NOTE — Discharge Instructions (Addendum)
 Doxycycline  antibiotics twice daily for 5 days  Prednisone  steroids, increased dosing and then taper back down to your baseline dosing after this.

## 2023-12-21 NOTE — ED Provider Notes (Signed)
-----------------------------------------   4:24 PM on 12/21/2023 -----------------------------------------  I took over care of this patient from Dr. Claudene.  On reassessment, the patient reports that his breathing is improved.  He is still wheezing somewhat, but is feeling a lot better.  I offered inpatient admission, however the patient would prefer to go home.  He is not requiring any more oxygen  than his baseline.  He is stable for discharge at this time.  I gave strict return precautions, and he expressed understanding.   Jacolyn Pae, MD 12/21/23 1624

## 2023-12-21 NOTE — ED Triage Notes (Signed)
 Pt to ED for increased shob started this am. Wears 2 L [place] chronic. Increased shob with exertion. C/o left elbow pain, hit it on door few days ago. Swelling noted. +chest pain.  Hx emphysema.

## 2023-12-21 NOTE — ED Provider Notes (Signed)
 [facility] Provider Note    Event Date/Time   First MD Initiated Contact with Patient 12/21/23 1217     (approximate)   History   Shortness of Breath and Elbow Pain   HPI  Joseph [name] is a 43 y.o. male who presents to the ED for evaluation of Shortness of Breath and Elbow Pain   Review a PCP visit from 1 month ago.  History of COPD  Patient presents to the ED for evaluation of shortness of breath and productive cough over the past few days, subjective fevers and chills.  Reports left elbow pain after he accidentally knocked it on a door frame a few days ago as well.   Physical Exam   Triage Vital Signs: ED Triage Vitals  Encounter Vitals Group     BP 12/21/23 1157 (!) 114/91     Girls Systolic BP Percentile --      Girls Diastolic BP Percentile --      Boys Systolic BP Percentile --      Boys Diastolic BP Percentile --      Pulse Rate 12/21/23 1157 (!) 116     Resp 12/21/23 1157 (!) 24     Temp 12/21/23 1157 98.2 F (36.8 C)     Temp src --      SpO2 12/21/23 1157 92 %     Weight 12/21/23 1158 167 lb (75.8 kg)     Height 12/21/23 1158 5' 5 (1.651 m)     Head Circumference --      Peak Flow --      Pain Score 12/21/23 1157 7     Pain Loc --      Pain Education --      Exclude from Growth Chart --     Most recent vital signs: Vitals:   12/21/23 1300 12/21/23 1400  BP:  108/70  Pulse: 86 85  Resp:  20  Temp:  98.1 F (36.7 C)  SpO2: 99% 99%    General: Awake, no distress.  CV:  Good peripheral perfusion.  Resp:  Mild tachypnea, wheezing throughout and decreased airflow.  Remains on baseline nasal cannula Abd:  No distention.  MSK:  No deformity noted.  Neuro:  No focal deficits appreciated. Other:  Soft tissue swelling to the ulnar aspect of the posterior left elbow, no evidence of open injury.  Full active and passive range of motion without impairment, some mild localized tenderness to palpation.  No erythema or rash.   Most consistent with bursitis   ED Results / Procedures / Treatments   Labs (all labs ordered are listed, but only abnormal results are displayed) Labs Reviewed  BASIC METABOLIC PANEL WITH GFR - Abnormal; Notable for the following components:      Result Value   Glucose, Bld 102 (*)    Calcium 8.8 (*)    All other components within normal limits  CBC - Abnormal; Notable for the following components:   WBC 17.1 (*)    MCH 25.9 (*)    RDW 17.2 (*)    Platelets 445 (*)    All other components within normal limits  TROPONIN I (HIGH SENSITIVITY)  TROPONIN I (HIGH SENSITIVITY)    EKG Sinus tachycardia with rate of 108 bpm.  Normal axis and intervals without clear signs of acute ischemia.  RADIOLOGY CXR interpreted by me without evidence of acute cardiopulmonary pathology. Plain film of the left elbow interpreted by me without evidence of bony pathology  Official radiology report(s): DG Elbow Complete Left Result Date: 12/21/2023 EXAM: 3 VIEW(S) XRAY OF THE LEFT ELBOW COMPARISON: None available. CLINICAL HISTORY: cp FINDINGS: BONES AND JOINTS: No acute fracture. No focal osseous lesion. No joint dislocation. No joint effusion. SOFT TISSUES: Dorsal forearm soft tissue swelling. IMPRESSION: 1. Dorsal forearm soft tissue swelling. 2. No acute osseous abnormality Electronically signed by: Waddell Calk MD 12/21/2023 02:16 PM EST RP Workstation: [id number]   DG Chest 2 View Result Date: 12/21/2023 EXAM: 2 VIEW(S) XRAY OF THE CHEST 12/21/2023 12:51:00 PM COMPARISON: 08/25/2023 CLINICAL HISTORY: cp FINDINGS: LUNGS AND PLEURA: No focal pulmonary opacity. No pulmonary edema. No pleural effusion. No pneumothorax. HEART AND MEDIASTINUM: No acute abnormality of the cardiac and mediastinal silhouettes. BONES AND SOFT TISSUES: Multiple healed posterior right rib fractures again noted. IMPRESSION: 1. No acute cardiopulmonary process. Electronically signed by: Waddell Calk MD 12/21/2023 02:15 PM EST RP  Workstation: [id number]    PROCEDURES and INTERVENTIONS:  .1-3 Lead EKG Interpretation  Performed by: Claudene Rover, MD Authorized by: Claudene Rover, MD     Interpretation: normal     ECG rate:  80   ECG rate assessment: normal     Rhythm: sinus rhythm     Ectopy: none     Conduction: normal     Medications  doxycycline  (VIBRA -TABS) tablet 100 mg (has no administration in time range)  ipratropium-albuterol  (DUONEB) 0.5-2.5 (3) MG/3ML nebulizer solution 6 mL (has no administration in time range)  ipratropium-albuterol  (DUONEB) 0.5-2.5 (3) MG/3ML nebulizer solution 6 mL (6 mLs Nebulization Given 12/21/23 1319)  predniSONE  (DELTASONE ) tablet 60 mg (60 mg Oral Given 12/21/23 1319)     IMPRESSION / MDM / ASSESSMENT AND PLAN / ED COURSE  I reviewed the triage vital signs and the nursing notes.  Differential diagnosis includes, but is not limited to, ACS, PTX, PNA, muscle strain/spasm, PE, dissection, anxiety, pleural effusion  {Patient presents with symptoms of an acute illness or injury that is potentially life-threatening.  Patient with COPD on chronic 2 L presents with shortness of breath and productive cough.  Reports increased sputum and subjective fevers.  No significant CXR infiltrate but will provide a few days of doxycycline  antibiotics.  He is on chronic prednisone  and will require burst to taper back down to his baseline 10 mg.  Leukocytosis is noted.  No significant metabolic derangements, 2 negative troponins.  Continued wheezing on reassessment, he will be signed out to oncoming physician to follow-up on his respiratory status after additional round of nebulizers.  Clinical Course as of 12/21/23 1457  Fri Dec 21, 2023  1453 reassessed [DS]    Clinical Course User Index [DS] Claudene Rover, MD     FINAL CLINICAL IMPRESSION(S) / ED DIAGNOSES   Final diagnoses:  COPD exacerbation (HCC)  Shortness of breath     Rx / DC Orders   ED Discharge Orders     None         Note:  This document was prepared using Dragon voice recognition software and may include unintentional dictation errors.   Claudene Rover, MD 12/21/23 1500

## 2023-12-31 ENCOUNTER — Encounter: Payer: Self-pay | Admitting: Ophthalmology
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
## 2024-01-01 NOTE — Discharge Instructions (Signed)

## 2024-01-03 ENCOUNTER — Other Ambulatory Visit: Payer: Self-pay

## 2024-01-03 ENCOUNTER — Encounter
Admission: RE | Disposition: A | Discharge status: Home / Self Care | Payer: Self-pay | Source: Home / Self Care | Attending: Ophthalmology

## 2024-01-03 ENCOUNTER — Ambulatory Visit: Payer: Self-pay | Admitting: Anesthesiology

## 2024-01-03 ENCOUNTER — Encounter: Payer: Self-pay | Admitting: Ophthalmology

## 2024-01-03 ENCOUNTER — Ambulatory Visit
Admission: RE | Admit: 2024-01-03 | Discharge: 2024-01-03 | Disposition: A | Discharge status: Home / Self Care | Attending: Ophthalmology | Admitting: Ophthalmology

## 2024-01-03 DIAGNOSIS — K449 Diaphragmatic hernia without obstruction or gangrene: Secondary | ICD-10-CM | POA: Diagnosis not present

## 2024-01-03 DIAGNOSIS — K219 Gastro-esophageal reflux disease without esophagitis: Secondary | ICD-10-CM | POA: Insufficient documentation

## 2024-01-03 DIAGNOSIS — Z59868 Other specified financial insecurity: Secondary | ICD-10-CM | POA: Diagnosis not present

## 2024-01-03 DIAGNOSIS — G4733 Obstructive sleep apnea (adult) (pediatric): Secondary | ICD-10-CM | POA: Diagnosis not present

## 2024-01-03 DIAGNOSIS — J4489 Other specified chronic obstructive pulmonary disease: Secondary | ICD-10-CM | POA: Diagnosis not present

## 2024-01-03 DIAGNOSIS — F17210 Nicotine dependence, cigarettes, uncomplicated: Secondary | ICD-10-CM | POA: Insufficient documentation

## 2024-01-03 DIAGNOSIS — Z9981 Dependence on supplemental oxygen: Secondary | ICD-10-CM | POA: Diagnosis not present

## 2024-01-03 DIAGNOSIS — H25041 Posterior subcapsular polar age-related cataract, right eye: Secondary | ICD-10-CM | POA: Diagnosis present

## 2024-01-03 DIAGNOSIS — E1136 Type 2 diabetes mellitus with diabetic cataract: Secondary | ICD-10-CM | POA: Insufficient documentation

## 2024-01-03 DIAGNOSIS — H2511 Age-related nuclear cataract, right eye: Secondary | ICD-10-CM | POA: Insufficient documentation

## 2024-01-03 DIAGNOSIS — I10 Essential (primary) hypertension: Secondary | ICD-10-CM | POA: Diagnosis not present

## 2024-01-03 HISTORY — PX: CATARACT EXTRACTION W/PHACO: SHX586

## 2024-01-03 HISTORY — DX: Allergic rhinitis, unspecified: J30.9

## 2024-01-03 HISTORY — DX: Lymphedema, not elsewhere classified: I89.0

## 2024-01-03 HISTORY — DX: Gastro-esophageal reflux disease without esophagitis: K21.9

## 2024-01-03 HISTORY — DX: Low back pain, unspecified: M54.50

## 2024-01-03 HISTORY — DX: Atherosclerosis of aorta: I70.0

## 2024-01-03 HISTORY — DX: Obstructive sleep apnea (adult) (pediatric): G47.33

## 2024-01-03 HISTORY — DX: Cannabis use, unspecified, in remission: F12.91

## 2024-01-03 HISTORY — DX: Unspecified asthma, uncomplicated: J45.909

## 2024-01-03 HISTORY — DX: Dependence on supplemental oxygen: Z99.81

## 2024-01-03 HISTORY — DX: Polyneuropathy, unspecified: G62.9

## 2024-01-03 LAB — GLUCOSE, CAPILLARY: Glucose-Capillary: 76 mg/dL (ref 70–99)

## 2024-01-03 SURGERY — PHACOEMULSIFICATION, CATARACT, WITH IOL INSERTION
Anesthesia: Monitor Anesthesia Care | Site: Eye | Laterality: Right

## 2024-01-03 MED ORDER — CYCLOPENTOLATE HCL 2 % OP SOLN
OPHTHALMIC | Status: AC
  Filled 2024-01-03: qty 2

## 2024-01-03 MED ORDER — SIGHTPATH DOSE#1 BSS IO SOLN
INTRAOCULAR | Status: DC | PRN
  Administered 2024-01-03: 72 mL via OPHTHALMIC

## 2024-01-03 MED ORDER — TETRACAINE HCL 0.5 % OP SOLN
1.0000 [drp] | OPHTHALMIC | Status: DC | PRN
  Administered 2024-01-03 (×3): 1 [drp] via OPHTHALMIC

## 2024-01-03 MED ORDER — LIDOCAINE HCL (PF) 2 % IJ SOLN
INTRAMUSCULAR | Status: DC | PRN
  Administered 2024-01-03: 4 mL via INTRAOCULAR

## 2024-01-03 MED ORDER — TETRACAINE HCL 0.5 % OP SOLN
OPHTHALMIC | Status: AC
  Filled 2024-01-03: qty 4

## 2024-01-03 MED ORDER — LACTATED RINGERS IV SOLN: INTRAVENOUS | Status: DC

## 2024-01-03 MED ORDER — FENTANYL CITRATE (PF) 100 MCG/2ML IJ SOLN
INTRAMUSCULAR | Status: DC | PRN
  Administered 2024-01-03: 100 ug via INTRAVENOUS

## 2024-01-03 MED ORDER — PHENYLEPHRINE HCL 10 % OP SOLN
1.0000 [drp] | OPHTHALMIC | Status: AC
  Administered 2024-01-03 (×3): 1 [drp] via OPHTHALMIC

## 2024-01-03 MED ORDER — PHENYLEPHRINE HCL 10 % OP SOLN
OPHTHALMIC | Status: AC
  Filled 2024-01-03: qty 5

## 2024-01-03 MED ORDER — MIDAZOLAM HCL (PF) 2 MG/2ML IJ SOLN
INTRAMUSCULAR | Status: DC | PRN
  Administered 2024-01-03: 2 mg via INTRAVENOUS

## 2024-01-03 MED ORDER — MOXIFLOXACIN HCL 0.5 % OP SOLN
OPHTHALMIC | Status: DC | PRN
  Administered 2024-01-03: .2 mL via OPHTHALMIC

## 2024-01-03 MED ORDER — SIGHTPATH DOSE#1 NA HYALUR & NA CHOND-NA HYALUR IO KIT
PACK | INTRAOCULAR | Status: DC | PRN
  Administered 2024-01-03: 1 via OPHTHALMIC

## 2024-01-03 MED ORDER — CYCLOPENTOLATE HCL 2 % OP SOLN
1.0000 [drp] | OPHTHALMIC | Status: AC
  Administered 2024-01-03 (×3): 1 [drp] via OPHTHALMIC

## 2024-01-03 MED ORDER — DEXMEDETOMIDINE HCL IN NACL 200 MCG/50ML IV SOLN
INTRAVENOUS | Status: DC | PRN
  Administered 2024-01-03: 20 ug via INTRAVENOUS
  Administered 2024-01-03: 4 ug via INTRAVENOUS

## 2024-01-03 MED ORDER — SIGHTPATH DOSE#1 BSS IO SOLN
INTRAOCULAR | Status: DC | PRN
  Administered 2024-01-03: 15 mL via INTRAOCULAR

## 2024-01-03 MED ORDER — MIDAZOLAM HCL 2 MG/2ML IJ SOLN
INTRAMUSCULAR | Status: AC
  Filled 2024-01-03: qty 2

## 2024-01-03 MED ORDER — BRIMONIDINE TARTRATE-TIMOLOL 0.2-0.5 % OP SOLN
OPHTHALMIC | Status: DC | PRN
  Administered 2024-01-03: 1 [drp] via OPHTHALMIC

## 2024-01-03 MED ORDER — FENTANYL CITRATE (PF) 100 MCG/2ML IJ SOLN
INTRAMUSCULAR | Status: AC
  Filled 2024-01-03: qty 2

## 2024-01-03 SURGICAL SUPPLY — 10 items
DISSECTOR HYDRO NUCLEUS 50X22 (MISCELLANEOUS) ×1 IMPLANT
DRSG TEGADERM 2-3/8X2-3/4 SM (GAUZE/BANDAGES/DRESSINGS) ×1 IMPLANT
FEE CATARACT SUITE SIGHTPATH (MISCELLANEOUS) ×1 IMPLANT
GLOVE BIOGEL PI IND STRL 8 (GLOVE) ×1 IMPLANT
GLOVE SURG LX STRL 7.5 STRW (GLOVE) ×1 IMPLANT
GLOVE SURG SYN 6.5 PF PI BL (GLOVE) ×1 IMPLANT
LENS IOL TECNIS MONO 21.0 (Intraocular Lens) IMPLANT
NDL FILTER BLUNT 18X1 1/2 (NEEDLE) ×1 IMPLANT
NEEDLE FILTER BLUNT 18X1 1/2 (NEEDLE) ×1 IMPLANT
SYR 3ML LL SCALE MARK (SYRINGE) ×1 IMPLANT

## 2024-01-03 NOTE — Transfer of Care (Signed)
 Immediate Anesthesia Transfer of Care Note  [name]  Procedure(s) Performed: PHACOEMULSIFICATION, CATARACT, WITH IOL INSERTION 4.79 00:46.0 (Right: Eye)  Patient Location: PACU  Anesthesia Type: MAC  Level of Consciousness: awake, alert  and patient cooperative  Airway and Oxygen  Therapy: Patient Spontanous Breathing   Post-op Assessment: Post-op Vital signs reviewed, Patient's Cardiovascular Status Stable, Respiratory Function Stable, Patent Airway and No signs of Nausea or vomiting  Post-op Vital Signs: Reviewed and stable  Complications: No notable events documented.

## 2024-01-03 NOTE — H&P (Signed)
 [facility]   Primary Care Physician:  Cyrus Selinda Moose, [name] Ophthalmologist: Dr. Feliciano Ober  Pre-Procedure History & Physical: HPI:  Joseph [name] is a 43 y.o. male here for cataract surgery.   Past Medical History:  Diagnosis Date   Allergic rhinitis    Aortic atherosclerosis    Asthma    Chronic headaches 12/17/2023   Headaches Located in left temporal region,onset 2023, sensitivity to light and headaches upon waking. 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   Severe persistent asthma without complication (HCC) 10/08/2023   Tobacco use 10/08/2023   had quit smoking about 6 years ago; 10 pack year hx but apparently restarted    Past Surgical History:  Procedure Laterality Date   ABDOMINAL AORTOGRAM W/LOWER EXTREMITY Left 01/18/2023   Procedure: ABDOMINAL AORTOGRAM W/LOWER EXTREMITY;  Surgeon: Jama Cordella MATSU, MD;  Location: [facility] INVASIVE CV LAB;  Service: Cardiovascular;  Laterality: Left;   OLECRANON BURSECTOMY Right 03/16/2022   Procedure: I&D OLECRANON (ELBOW) BURSA;  Surgeon: Tobie Priest, MD;  Location: [facility] ORS;  Service: Orthopedics;  Laterality: Right;    Prior to Admission medications   Medication Sig Start Date End Date Taking? Authorizing Provider  albuterol  (PROVENTIL  HFA;VENTOLIN  HFA) 108 (90 Base) MCG/ACT inhaler Inhale 2 puffs into the lungs every 6 (six) hours as needed for wheezing or shortness of breath.   Yes [provider]  albuterol  (PROVENTIL ) (2.5 MG/3ML) 0.083% nebulizer solution Take  2.5 mg by nebulization every 6 (six) hours as needed for wheezing or shortness of breath.   Yes [provider]  dupilumab (DUPIXENT) 300 MG/2ML prefilled syringe Inject 300 mg into the skin every 14 (fourteen) days.   Yes [provider]  EPINEPHrine 0.3 mg/0.3 mL IJ SOAJ injection Inject 0.3 mLs into the muscle as needed. 07/05/23 07/04/24 Yes [provider]  fluticasone -salmeterol (ADVAIR) 250-50 MCG/ACT AEPB Inhale 1 Puff into the lungs every 12 (twelve) hours 02/05/23 02/05/24 Yes [provider]  furosemide  (LASIX ) 20 MG tablet Take 20 mg by mouth daily as needed for edema or fluid.   Yes [provider]  hydrochlorothiazide  (HYDRODIURIL ) 25 MG tablet Take 25 mg by mouth daily. 09/24/20  Yes [provider]  JARDIANCE 10 MG TABS tablet Take 10 mg by mouth daily.   Yes [provider]  KERENDIA 10 MG TABS Take 1 tablet by mouth daily.   Yes [provider]  losartan  (COZAAR ) 100 MG tablet Take 1 tablet (100 mg total) by mouth daily. 09/03/21  Yes Wouk, Devaughn Sayres, MD  montelukast  (SINGULAIR ) 10 MG tablet Take 10 mg by mouth at bedtime.   Yes [provider]  Multiple Vitamin (MULTIVITAMIN WITH MINERALS) TABS tablet Take 1 tablet by mouth daily. 01/26/23  Yes Alexander, Natalie, DO  omeprazole (PRILOSEC) 20 MG capsule Take 20 mg by mouth daily as needed (GERD symptoms).   Yes [provider]  OXYGEN  Inhale into the lungs.   Yes [provider]  predniSONE  (  DELTASONE ) 10 MG tablet Take 10 mg by mouth daily with breakfast.   Yes [provider]  INCRUSE ELLIPTA  62.5 MCG/ACT AEPB Inhale 1 puff into the lungs daily. 07/04/22   [provider]  lidocaine  (LIDODERM ) 5 % Place 1 patch onto the skin daily. Remove & Discard patch within 12 hours or as directed by MD 01/26/23   Marsa Edelman, DO    Allergies as of 11/29/2023 - Review Complete 11/16/2023  Allergen Reaction Noted    Penicillins Shortness Of Breath 03/27/2017   Porcine (pork) protein-containing drug products Nausea Only 07/01/2014   Tomato Rash 01/17/2023   Banana Hives 03/27/2017   Coconut (cocos nucifera) Hives 03/15/2022    Family History  Problem Relation Age of Onset   Hypertension Mother    Glaucoma Mother    Cancer - Ovarian Sister     Social History   Socioeconomic History   Marital status: Married    Spouse name: Not on file   Number of children: Not on file   Years of education: Not on file   Highest education level: Not on file  Occupational History   Not on file  Tobacco Use   Smoking status: Every Day    Current packs/day: 0.50    Average packs/day: 0.5 packs/day for 30.9 years (15.4 ttl pk-yrs)    Types: Cigarettes    Start date: 1995   Smokeless tobacco: Never  Vaping Use   Vaping status: Never Used  Substance and Sexual Activity   Alcohol  use: No   Drug use: Yes    Types: Marijuana   Sexual activity: Not on file  Other Topics Concern   Not on file  Social History Narrative   Not on file   Social Drivers of Health   Financial Resource Strain: Low Risk  (03/24/2023)   Received from [facility]   Overall Financial Resource Strain (CARDIA)    Difficulty of Paying Living Expenses: Not hard at all  Recent Concern: Financial Resource Strain - Medium Risk (03/16/2023)   Received from [facility]   Overall Financial Resource Strain (CARDIA)    Difficulty of Paying Living Expenses: Somewhat hard  Food Insecurity: No Food Insecurity (03/24/2023)   Received from [facility]   Hunger Vital Sign    Within the past 12 months, you worried that your food would run out before you got the money to buy more.: Never true    Within the past 12 months, the food you bought just didn't last and you didn't have money to get more.: Never true  Transportation Needs: No Transportation Needs (03/24/2023)   Received from [facility] - Transportation    In the past 12 months, has lack of transportation kept you from medical appointments or from getting medications?: No    Lack of Transportation (Non-Medical): No  Physical Activity: Not on file  Stress: Not on file  Social Connections: Not on file  Intimate Partner Violence: Not At Risk (01/18/2023)   Humiliation, Afraid, Rape, and Kick questionnaire    Fear of Current or Ex-Partner: No    Emotionally Abused: No    Physically Abused: No    Sexually Abused: No    Review of Systems: See HPI, otherwise negative ROS  Physical Exam: Ht 5' 5 (1.651 m)   Wt 75.3 kg   BMI 27.62 kg/m  General:   Alert, cooperative in NAD Head:  Normocephalic and atraumatic. Respiratory:  Normal work of breathing. Cardiovascular:  RRR  Impression/Plan: Joseph [name] is here for cataract surgery.  Risks, benefits, limitations, and alternatives regarding cataract surgery have been reviewed with the patient.  Questions have been answered.  All parties agreeable.   Feliciano Bryan Ober, MD  01/03/2024, 7:26 AM

## 2024-01-03 NOTE — Op Note (Signed)
 OPERATIVE NOTE  Kali Ambler [id number] 01/03/2024   PREOPERATIVE DIAGNOSIS: Nuclear sclerotic cataract right eye. H25.11   POSTOPERATIVE DIAGNOSIS: Nuclear sclerotic cataract right eye. H25.11   PROCEDURE:  Phacoemulsification with posterior chamber intraocular lens placement of the right eye  Ultrasound time: Procedure(s): PHACOEMULSIFICATION, CATARACT, WITH IOL INSERTION 4.79 00:46.0 (Right)  LENS:   Implant Name Type Inv. Item Serial No. Manufacturer Lot No. LRB No. Used Action  LENS IOL TECNIS MONO 21.0 - [id number] Intraocular Lens LENS IOL TECNIS MONO 21.0 [id number] [employer]  Right 1 Implanted      SURGEON:  Feliciano HERO. Enola, MD   ANESTHESIA:  Topical with tetracaine  drops, augmented with 1% preservative-free intracameral lidocaine .   COMPLICATIONS:  None.   DESCRIPTION OF PROCEDURE:  The patient was identified in the holding room and transported to the operating room and placed in the supine position under the operating microscope.  The right eye was identified as the operative eye, which was prepped and draped in the usual sterile ophthalmic fashion.   A 1 millimeter clear-corneal paracentesis was made superotemporally. Preservative-free 1% lidocaine  mixed with 1:1,000 bisulfite-free aqueous solution of epinephrine was injected into the anterior chamber. The anterior chamber was then filled with Viscoat viscoelastic. A 2.4 millimeter keratome was used to make a clear-corneal incision inferotemporally. A curvilinear capsulorrhexis was made with a cystotome and capsulorrhexis forceps. Balanced salt solution was used to hydrodissect and hydrodelineate the nucleus. Phacoemulsification was then used to remove the lens nucleus and epinucleus. The remaining cortex was then removed using the irrigation and aspiration handpiece. Provisc was then placed into the capsular bag to distend it for lens placement. A +21.00 D DCB00 intraocular lens was then injected into the capsular bag.  The remaining viscoelastic was aspirated.   Wounds were hydrated with balanced salt solution.  The anterior chamber was inflated to a physiologic pressure with balanced salt solution.  No wound leaks were noted. Moxifloxacin was injected intracamerally.  Timolol and Brimonidine drops were applied to the eye.  The patient was taken to the recovery room in stable condition without complications of anesthesia or surgery.  Feliciano Hugger [place] 01/03/2024, 2:54 PM

## 2024-01-04 NOTE — Anesthesia Postprocedure Evaluation (Signed)
 Anesthesia Post Note  [name]  Procedure(s) Performed: PHACOEMULSIFICATION, CATARACT, WITH IOL INSERTION 4.79 00:46.0 (Right: Eye)  Patient location during evaluation: PACU Anesthesia Type: MAC Level of consciousness: awake and alert Pain management: pain level controlled Vital Signs Assessment: post-procedure vital signs reviewed and stable Respiratory status: spontaneous breathing, nonlabored ventilation, respiratory function stable and patient connected to nasal cannula oxygen  Cardiovascular status: stable and blood pressure returned to baseline Postop Assessment: no apparent nausea or vomiting Anesthetic complications: no   No notable events documented.   Last Vitals:  Vitals:   01/03/24 1455 01/03/24 1500  BP: 115/84 116/87  Pulse: 82   Resp: 20   Temp: 36.7 C   SpO2: 96%     Last Pain:  Vitals:   01/03/24 1455  TempSrc:   PainSc: 0-No pain                 Marilene Vath C Eldonna Neuenfeldt

## 2024-01-08 ENCOUNTER — Encounter: Payer: Self-pay | Admitting: Ophthalmology

## 2024-01-14 NOTE — Discharge Instructions (Signed)
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
## 2024-01-17 ENCOUNTER — Encounter
Admission: RE | Disposition: A | Discharge status: Home / Self Care | Payer: Self-pay | Source: Home / Self Care | Attending: Ophthalmology

## 2024-01-17 ENCOUNTER — Ambulatory Visit
Admission: RE | Admit: 2024-01-17 | Discharge: 2024-01-17 | Disposition: A | Attending: Ophthalmology | Admitting: Ophthalmology

## 2024-01-17 ENCOUNTER — Ambulatory Visit: Payer: Self-pay | Admitting: Anesthesiology

## 2024-01-17 ENCOUNTER — Other Ambulatory Visit: Payer: Self-pay

## 2024-01-17 HISTORY — PX: CATARACT EXTRACTION W/PHACO: SHX586

## 2024-01-17 LAB — GLUCOSE, CAPILLARY: Glucose-Capillary: 100 mg/dL — ABNORMAL HIGH (ref 70–99)

## 2024-01-17 SURGERY — PHACOEMULSIFICATION, CATARACT, WITH IOL INSERTION
Anesthesia: Topical | Site: Eye | Laterality: Left

## 2024-01-17 MED ORDER — DEXMEDETOMIDINE HCL IN NACL 200 MCG/50ML IV SOLN
INTRAVENOUS | Status: DC | PRN
  Administered 2024-01-17 (×3): 12 ug via INTRAVENOUS

## 2024-01-17 MED ORDER — CYCLOPENTOLATE HCL 2 % OP SOLN
OPHTHALMIC | Status: AC
  Filled 2024-01-17: qty 2

## 2024-01-17 MED ORDER — MIDAZOLAM HCL (PF) 2 MG/2ML IJ SOLN
INTRAMUSCULAR | Status: DC | PRN
  Administered 2024-01-17: 2 mg via INTRAVENOUS

## 2024-01-17 MED ORDER — PROPOFOL 10 MG/ML IV BOLUS
INTRAVENOUS | Status: DC | PRN
  Administered 2024-01-17 (×2): 30 mg via INTRAVENOUS

## 2024-01-17 MED ORDER — PHENYLEPHRINE HCL 10 % OP SOLN
1.0000 [drp] | OPHTHALMIC | Status: DC | PRN
  Administered 2024-01-17 (×3): 1 [drp] via OPHTHALMIC

## 2024-01-17 MED ORDER — FENTANYL CITRATE (PF) 100 MCG/2ML IJ SOLN
INTRAMUSCULAR | Status: DC | PRN
  Administered 2024-01-17 (×2): 50 ug via INTRAVENOUS

## 2024-01-17 MED ORDER — FENTANYL CITRATE (PF) 100 MCG/2ML IJ SOLN
INTRAMUSCULAR | Status: AC
  Filled 2024-01-17: qty 2

## 2024-01-17 MED ORDER — SIGHTPATH DOSE#1 NA CHONDROIT SULF-NA HYALURON 20-15 MG/0.5ML IO SOSY
INTRAOCULAR | Status: DC | PRN
  Administered 2024-01-17: .5 mL via INTRAOCULAR

## 2024-01-17 MED ORDER — MOXIFLOXACIN HCL 0.5 % OP SOLN
OPHTHALMIC | Status: DC | PRN
  Administered 2024-01-17: .2 mL via OPHTHALMIC

## 2024-01-17 MED ORDER — SIGHTPATH DOSE#1 NA HYALUR & NA CHOND-NA HYALUR IO KIT
PACK | INTRAOCULAR | Status: DC | PRN
  Administered 2024-01-17: 1 via OPHTHALMIC

## 2024-01-17 MED ORDER — PHENYLEPHRINE HCL 10 % OP SOLN
OPHTHALMIC | Status: AC
  Filled 2024-01-17: qty 5

## 2024-01-17 MED ORDER — SIGHTPATH DOSE#1 BSS IO SOLN
INTRAOCULAR | Status: DC | PRN
  Administered 2024-01-17: 15 mL via INTRAOCULAR

## 2024-01-17 MED ORDER — CYCLOPENTOLATE HCL 2 % OP SOLN
1.0000 [drp] | OPHTHALMIC | Status: DC | PRN
  Administered 2024-01-17 (×3): 1 [drp] via OPHTHALMIC

## 2024-01-17 MED ORDER — TETRACAINE HCL 0.5 % OP SOLN
1.0000 [drp] | OPHTHALMIC | Status: DC | PRN
  Administered 2024-01-17 (×3): 1 [drp] via OPHTHALMIC

## 2024-01-17 MED ORDER — MIDAZOLAM HCL 2 MG/2ML IJ SOLN
INTRAMUSCULAR | Status: AC
  Filled 2024-01-17: qty 2

## 2024-01-17 MED ORDER — BRIMONIDINE TARTRATE-TIMOLOL 0.2-0.5 % OP SOLN
OPHTHALMIC | Status: DC | PRN
  Administered 2024-01-17: 1 [drp] via OPHTHALMIC

## 2024-01-17 MED ORDER — TETRACAINE HCL 0.5 % OP SOLN
OPHTHALMIC | Status: AC
  Filled 2024-01-17: qty 4

## 2024-01-17 MED ORDER — PROPOFOL 10 MG/ML IV BOLUS
INTRAVENOUS | Status: AC
  Filled 2024-01-17: qty 20

## 2024-01-17 MED ORDER — SIGHTPATH DOSE#1 BSS IO SOLN
INTRAOCULAR | Status: DC | PRN
  Administered 2024-01-17: 51 mL via OPHTHALMIC

## 2024-01-17 MED ORDER — LIDOCAINE HCL (PF) 2 % IJ SOLN
INTRAOCULAR | Status: DC | PRN
  Administered 2024-01-17: 4 mL via INTRAOCULAR

## 2024-01-17 MED ORDER — LACTATED RINGERS IV SOLN: INTRAVENOUS | Status: DC

## 2024-01-17 SURGICAL SUPPLY — 9 items
DISSECTOR HYDRO NUCLEUS 50X22 (MISCELLANEOUS) ×1 IMPLANT
DRSG TEGADERM 2-3/8X2-3/4 SM (GAUZE/BANDAGES/DRESSINGS) ×1 IMPLANT
FEE CATARACT SUITE SIGHTPATH (MISCELLANEOUS) ×1 IMPLANT
GLOVE BIOGEL PI IND STRL 8 (GLOVE) ×1 IMPLANT
GLOVE SURG LX STRL 7.5 STRW (GLOVE) ×1 IMPLANT
GLOVE SURG SYN 6.5 PF PI BL (GLOVE) ×1 IMPLANT
LENS IOL TECNIS MONO 20.5 (Intraocular Lens) IMPLANT
NDL FILTER BLUNT 18X1 1/2 (NEEDLE) ×1 IMPLANT
SYR 3ML LL SCALE MARK (SYRINGE) ×1 IMPLANT

## 2024-01-17 NOTE — Transfer of Care (Signed)
 Immediate Anesthesia Transfer of Care Note  [name]  Procedure(s) Performed: PHACOEMULSIFICATION, CATARACT, WITH IOL INSERTION 0.60 00:09.4 (Left: Eye)  Patient Location: PACU  Anesthesia Type: MAC  Level of Consciousness: awake, alert  and patient cooperative  Airway and Oxygen  Therapy: Patient Spontanous Breathing   Post-op Assessment: Post-op Vital signs reviewed, Patient's Cardiovascular Status Stable, Respiratory Function Stable, Patent Airway and No signs of Nausea or vomiting  Post-op Vital Signs: Reviewed and stable  Complications: No notable events documented.

## 2024-01-17 NOTE — H&P (Signed)
 [facility]   Primary Care Physician:  Cyrus Selinda Moose, [name] Ophthalmologist: Dr. Feliciano Ober  Pre-Procedure History & Physical: HPI:  Joseph [name] is a 43 y.o. male here for cataract surgery.   Past Medical History:  Diagnosis Date   Allergic rhinitis    Aortic atherosclerosis    Asthma    Chronic headaches 12/17/2023   Headaches Located in left temporal region,onset 2023, sensitivity to light and headaches upon waking. 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   Severe persistent asthma without complication (HCC) 10/08/2023   Tobacco use 10/08/2023   had quit smoking about 6 years ago; 10 pack year hx but apparently restarted   Uncontrolled asthma     Past Surgical History:  Procedure Laterality Date   ABDOMINAL AORTOGRAM W/LOWER EXTREMITY Left 01/18/2023   Procedure: ABDOMINAL AORTOGRAM W/LOWER EXTREMITY;  Surgeon: Jama Cordella MATSU, MD;  Location: [facility];  Service: Cardiovascular;  Laterality: Left;   CATARACT EXTRACTION W/PHACO Right 01/03/2024   Procedure: PHACOEMULSIFICATION, CATARACT, WITH IOL INSERTION 4.79 00:46.0;  Surgeon: Ober Feliciano Hugger, MD;  Location: [facility];  Service: Ophthalmology;  Laterality: Right;   OLECRANON BURSECTOMY Right 03/16/2022   Procedure: I&D OLECRANON (ELBOW) BURSA;  Surgeon: Tobie Priest, MD;  Location: [facility];  Service: Orthopedics;  Laterality: Right;    Prior to Admission medications   Medication Sig Start Date End Date Taking? Authorizing Provider   albuterol  (PROVENTIL  HFA;VENTOLIN  HFA) 108 (90 Base) MCG/ACT inhaler Inhale 2 puffs into the lungs every 6 (six) hours as needed for wheezing or shortness of breath.    [provider]  albuterol  (PROVENTIL ) (2.5 MG/3ML) 0.083% nebulizer solution Take 2.5 mg by nebulization every 6 (six) hours as needed for wheezing or shortness of breath.    [provider]  dupilumab (DUPIXENT) 300 MG/2ML prefilled syringe Inject 300 mg into the skin every 14 (fourteen) days.    [provider]  EPINEPHrine  0.3 mg/0.3 mL IJ SOAJ injection Inject 0.3 mLs into the muscle as needed. 07/05/23 07/04/24  [provider]  fluticasone -salmeterol (ADVAIR) 250-50 MCG/ACT AEPB Inhale 1 Puff into the lungs every 12 (twelve) hours 02/05/23 02/05/24  [provider]  furosemide  (LASIX ) 20 MG tablet Take 20 mg by mouth daily as needed for edema or fluid.    [provider]  hydrochlorothiazide  (HYDRODIURIL ) 25 MG tablet Take 25 mg by mouth daily. 09/24/20   [provider]  INCRUSE ELLIPTA  62.5 MCG/ACT AEPB Inhale 1 puff into the lungs daily. 07/04/22   [provider]  JARDIANCE 10 MG TABS tablet Take 10 mg by mouth daily.    [provider]  KERENDIA 10 MG TABS Take 1 tablet by mouth daily.    [provider]  lidocaine  (LIDODERM ) 5 % Place 1 patch onto the skin daily. Remove & Discard patch within 12 hours or as directed by MD 01/26/23   Marsa Edelman, DO  losartan  (COZAAR ) 100 MG tablet Take 1 tablet (  100 mg total) by mouth daily. 09/03/21   Wouk, Devaughn Sayres, MD  montelukast  (SINGULAIR ) 10 MG tablet Take 10 mg by mouth at bedtime.    [provider]  Multiple Vitamin (MULTIVITAMIN WITH MINERALS) TABS tablet Take 1 tablet by mouth daily. 01/26/23   Alexander, Natalie, DO  omeprazole (PRILOSEC) 20 MG capsule Take 20 mg by mouth daily as needed (GERD symptoms).    [provider]  OXYGEN  Inhale into the lungs.     [provider]  predniSONE  (DELTASONE ) 10 MG tablet Take 10 mg by mouth daily with breakfast.    [provider]    Allergies as of 11/29/2023 - Review Complete 11/16/2023  Allergen Reaction Noted   Penicillins Shortness Of Breath 03/27/2017   Porcine (pork) protein-containing drug products Nausea Only 07/01/2014   Tomato Rash 01/17/2023   Banana Hives 03/27/2017   Coconut (cocos nucifera) Hives 03/15/2022    Family History  Problem Relation Age of Onset   Hypertension Mother    Glaucoma Mother    Cancer - Ovarian Sister     Social History   Socioeconomic History   Marital status: Married    Spouse name: Not on file   Number of children: Not on file   Years of education: Not on file   Highest education level: Not on file  Occupational History   Not on file  Tobacco Use   Smoking status: Every Day    Current packs/day: 0.50    Average packs/day: 0.5 packs/day for 30.9 years (15.5 ttl pk-yrs)    Types: Cigarettes    Start date: 1995   Smokeless tobacco: Never  Vaping Use   Vaping status: Never Used  Substance and Sexual Activity   Alcohol  use: No   Drug use: Yes    Types: Marijuana   Sexual activity: Not on file  Other Topics Concern   Not on file  Social History Narrative   Not on file   Social Drivers of Health   Financial Resource Strain: Low Risk  (01/07/2024)   Received [facility]   Overall Financial Resource Strain (CARDIA)    Difficulty of Paying Living Expenses: Not hard at all  Food Insecurity: No Food Insecurity (01/07/2024)   Received from [facility]   Hunger Vital Sign    Within the past 12 months, you worried that your food would run out before you got the money to buy more.: Never true    Within the past 12 months, the food you bought just didn't last and you didn't have money to get more.: Never true  Transportation Needs: No Transportation Needs (01/07/2024)   Received from [facility] - Transportation    In the past 12 months, has lack of transportation kept you from medical appointments or from getting medications?: No    Lack of Transportation (Non-Medical): No  Physical Activity: Not on file  Stress: Not on file  Social Connections: Not on file  Intimate Partner Violence: Not At Risk (01/18/2023)   Humiliation, Afraid, Rape, and Kick questionnaire    Fear of Current or Ex-Partner: No    Emotionally Abused: No    Physically Abused: No    Sexually Abused: No    Review of Systems: See HPI, otherwise negative ROS  Physical Exam: There were no vitals taken for this visit. General:   Alert, cooperative in NAD Head:  Normocephalic and atraumatic. Respiratory:  Normal work of breathing.  Cardiovascular:  RRR  Impression/Plan: Joseph [name] is here for cataract surgery.  Risks, benefits, limitations, and alternatives regarding cataract surgery have been reviewed with the patient.  Questions have been answered.  All parties agreeable.   Feliciano Bryan Ober, MD  01/17/2024, 7:15 AM

## 2024-01-17 NOTE — Anesthesia Postprocedure Evaluation (Signed)
 Anesthesia Post Note  [name]  Procedure(s) Performed: PHACOEMULSIFICATION, CATARACT, WITH IOL INSERTION 0.60 00:09.4 (Left: Eye)  Patient location during evaluation: PACU Anesthesia Type: MAC Level of consciousness: awake and alert Pain management: pain level controlled Vital Signs Assessment: post-procedure vital signs reviewed and stable Respiratory status: spontaneous breathing, nonlabored ventilation, respiratory function stable and patient connected to nasal cannula oxygen  Cardiovascular status: stable and blood pressure returned to baseline Postop Assessment: no apparent nausea or vomiting Anesthetic complications: no   No notable events documented.   Last Vitals:  Vitals:   01/17/24 1416 01/17/24 1421  BP: 107/73 97/69  Pulse: 99 98  Resp: (!) 21 17  Temp: (!) 36.4 C   SpO2: 96% 95%    Last Pain:  Vitals:   01/17/24 1421  TempSrc:   PainSc: 0-No pain                 Joseph [name]

## 2024-01-17 NOTE — Op Note (Signed)
 OPERATIVE NOTE  Joseph [name] [id number] 01/17/2024   PREOPERATIVE DIAGNOSIS: Nuclear sclerotic cataract left eye. H25.12   POSTOPERATIVE DIAGNOSIS: Nuclear sclerotic cataract left eye. H25.12   PROCEDURE:  Phacoemulsification with posterior chamber intraocular lens placement of the left eye  Ultrasound time: Procedure(s): PHACOEMULSIFICATION, CATARACT, WITH IOL INSERTION 0.60 00:09.4 (Left)  LENS:   Implant Name Type Inv. Item Serial No. Manufacturer Lot No. LRB No. Used Action  LENS IOL TECNIS MONO 20.5 - [id number] Intraocular Lens LENS IOL TECNIS MONO 20.5 [id number] [employer]  Left 1 Implanted      SURGEON:  Feliciano HERO. Enola, MD   ANESTHESIA:  Topical with tetracaine  drops, augmented with 1% preservative-free intracameral lidocaine .   COMPLICATIONS:  None.   DESCRIPTION OF PROCEDURE:  The patient was identified in the holding room and transported to the operating room and placed in the supine position under the operating microscope.  The left eye was identified as the operative eye, which was prepped and draped in the usual sterile ophthalmic fashion.   A 1 millimeter clear-corneal paracentesis was made inferotemporally. Preservative-free 1% lidocaine  mixed with 1:1,000 bisulfite-free aqueous solution of epinephrine  was injected into the anterior chamber. The anterior chamber was then filled with Viscoat viscoelastic. A 2.4 millimeter keratome was used to make a clear-corneal incision superotemporally. A curvilinear capsulorrhexis was made with a cystotome and capsulorrhexis forceps. Balanced salt solution was used to hydrodissect and hydrodelineate the nucleus. Phacoemulsification was then used to remove the lens nucleus and epinucleus. The remaining cortex was then removed using the irrigation and aspiration handpiece. Provisc was then placed into the capsular bag to distend it for lens placement. A +20.50 D DCB00 intraocular lens was then injected into the capsular bag. The  remaining viscoelastic was aspirated.   Wounds were hydrated with balanced salt solution.  The anterior chamber was inflated to a physiologic pressure with balanced salt solution.  No wound leaks were noted. Moxifloxacin  was injected intracamerally.  Timolol  and Brimonidine  drops were applied to the eye.  The patient was taken to the recovery room in stable condition without complications of anesthesia or surgery.  [employer] 01/17/2024, 2:15 PM

## 2024-01-18 ENCOUNTER — Encounter: Payer: Self-pay | Admitting: Ophthalmology
